# Patient Record
Sex: Female | Born: 1955
Health system: Southern US, Community
[De-identification: ages and names within clinical notes are randomized; demographics above are authoritative.]

## PROBLEM LIST (undated history)

## (undated) DIAGNOSIS — F419 Anxiety disorder, unspecified: Secondary | ICD-10-CM

## (undated) DIAGNOSIS — G44229 Chronic tension-type headache, not intractable: Secondary | ICD-10-CM

## (undated) DIAGNOSIS — Z789 Other specified health status: Secondary | ICD-10-CM

## (undated) DIAGNOSIS — M5412 Radiculopathy, cervical region: Secondary | ICD-10-CM

## (undated) DIAGNOSIS — F32A Depression, unspecified: Secondary | ICD-10-CM

## (undated) DIAGNOSIS — F1010 Alcohol abuse, uncomplicated: Secondary | ICD-10-CM

## (undated) DIAGNOSIS — M1120 Other chondrocalcinosis, unspecified site: Secondary | ICD-10-CM

## (undated) DIAGNOSIS — R5382 Chronic fatigue, unspecified: Secondary | ICD-10-CM

## (undated) DIAGNOSIS — F329 Major depressive disorder, single episode, unspecified: Secondary | ICD-10-CM

## (undated) DIAGNOSIS — R42 Dizziness and giddiness: Secondary | ICD-10-CM

## (undated) DIAGNOSIS — M1990 Unspecified osteoarthritis, unspecified site: Secondary | ICD-10-CM

## (undated) DIAGNOSIS — M2550 Pain in unspecified joint: Secondary | ICD-10-CM

## (undated) DIAGNOSIS — F10239 Alcohol dependence with withdrawal, unspecified: Secondary | ICD-10-CM

## (undated) DIAGNOSIS — F1014 Alcohol abuse with alcohol-induced mood disorder: Secondary | ICD-10-CM

## (undated) DIAGNOSIS — F3289 Other specified depressive episodes: Secondary | ICD-10-CM

## (undated) DIAGNOSIS — F1910 Other psychoactive substance abuse, uncomplicated: Secondary | ICD-10-CM

## (undated) DIAGNOSIS — F1020 Alcohol dependence, uncomplicated: Secondary | ICD-10-CM

## (undated) DIAGNOSIS — T753XXA Motion sickness, initial encounter: Secondary | ICD-10-CM

## (undated) DIAGNOSIS — F10129 Alcohol abuse with intoxication, unspecified: Secondary | ICD-10-CM

## (undated) DIAGNOSIS — G43E09 Chronic migraine with aura, not intractable, without status migrainosus: Secondary | ICD-10-CM

## (undated) HISTORY — DX: Alcohol abuse with intoxication, unspecified: F10.129

## (undated) HISTORY — DX: Other psychoactive substance abuse, uncomplicated: F19.10

## (undated) HISTORY — DX: Other specified depressive episodes: F32.89

## (undated) HISTORY — DX: Alcohol dependence, uncomplicated: F10.20

## (undated) HISTORY — PX: FOOT SURGERY: SHX648

## (undated) HISTORY — DX: Alcohol abuse with alcohol-induced mood disorder: F10.14

## (undated) HISTORY — DX: Alcohol dependence with withdrawal, unspecified: F10.239

## (undated) HISTORY — PX: NO PAST SURGERIES: SHX2092

## (undated) HISTORY — PX: CARPAL TUNNEL RELEASE: SHX101

## (undated) HISTORY — PX: TONSILLECTOMY: SUR1361

## (undated) HISTORY — PX: WRIST SURGERY: SHX841

---

## 1999-04-17 ENCOUNTER — Other Ambulatory Visit: Admission: RE | Admit: 1999-04-17 | Discharge: 1999-04-17 | Payer: Self-pay | Admitting: *Deleted

## 2000-10-09 ENCOUNTER — Other Ambulatory Visit: Admission: RE | Admit: 2000-10-09 | Discharge: 2000-10-09 | Payer: Self-pay | Admitting: *Deleted

## 2009-04-09 ENCOUNTER — Emergency Department: Payer: Self-pay | Admitting: Emergency Medicine

## 2009-04-11 ENCOUNTER — Inpatient Hospital Stay: Payer: Self-pay | Admitting: Psychiatry

## 2009-04-23 ENCOUNTER — Emergency Department: Payer: Self-pay | Admitting: Emergency Medicine

## 2009-06-19 ENCOUNTER — Emergency Department (HOSPITAL_COMMUNITY): Admission: EM | Admit: 2009-06-19 | Discharge: 2009-06-19 | Payer: Self-pay | Admitting: Emergency Medicine

## 2013-09-29 ENCOUNTER — Ambulatory Visit (INDEPENDENT_AMBULATORY_CARE_PROVIDER_SITE_OTHER): Payer: PRIVATE HEALTH INSURANCE

## 2013-09-29 ENCOUNTER — Encounter: Payer: Self-pay | Admitting: Podiatry

## 2013-09-29 ENCOUNTER — Ambulatory Visit (INDEPENDENT_AMBULATORY_CARE_PROVIDER_SITE_OTHER): Payer: PRIVATE HEALTH INSURANCE | Admitting: Podiatry

## 2013-09-29 VITALS — BP 124/80 | HR 83 | Resp 16 | Ht 65.0 in | Wt 112.0 lb

## 2013-09-29 DIAGNOSIS — M79609 Pain in unspecified limb: Secondary | ICD-10-CM

## 2013-09-29 DIAGNOSIS — M79671 Pain in right foot: Secondary | ICD-10-CM

## 2013-09-29 DIAGNOSIS — M2010 Hallux valgus (acquired), unspecified foot: Secondary | ICD-10-CM

## 2013-09-29 DIAGNOSIS — M2040 Other hammer toe(s) (acquired), unspecified foot: Secondary | ICD-10-CM

## 2013-09-29 NOTE — Progress Notes (Signed)
Patty Cruz presents today as a 57 year old white female with a chief complaint of painful bunions and hammertoes bilaterally. She states that the left foot is worse than the right. This is been like this for about 3 years what really bothers me as the crossing over the second toe and wearing closed in shoes tends to bother the toe. States the right foot is been bother me for about 15 years typically the fourth toe over here as it lays beneath the third toe and of course the bunions. She relates that she's tried wider shoe gear anti-inflammatories shoe gear modifications all to no avail.  Objective: Vital signs are stable she is alert and oriented x3. I have reviewed her past medical history medications and allergies. Her review of systems unremarkable. Lower extremity evaluation demonstrates pulses are palpable bilateral. Neurologic sensorium is intact per Semmes-Weinstein monofilament. Deep tendon reflexes are intact and brisk bilateral. Muscle strength was 5 over 5 dorsiflexors plantar flexors inverters and evertors. Orthopedic evaluation demonstrates moderate to severe HAV deformities left greater than right. These deformities are rigid in nature. They appear to be track bound very dorsiflexion is limited. She has a hammertoe deformity with overlapping of the second toe over the hallux left foot. Right foot demonstrates severe hallux valgus minimal hammertoe deformity second right. Fourth digit right foot does demonstrate adductovarus rotation is being irritated by the third toe walking on it. Radiographic evaluation demonstrates after mentioned HAV deformities and hammertoe deformities. It also demonstrates hallux valgus deformity with degenerative joint disease bilaterally.  Assessment: Hallux abductovalgus deformity bilateral. Also of her hammertoe deformity left. Hallux valgus right. Mild flexible hammertoe deformity second. Adductovarus rotated hammertoe deformity fourth right. And plantar flexed elongated  second metatarsals bilateral.  Plan: We discussed the etiology pathology conservative versus surgical therapies. We discussed Austin bunion repair second metatarsal osteotomy with screw fixation bilateral. Possibly hammertoe repair fourth digit right with screw. I will followup with her in the near future for surgical intervention and consult.

## 2013-09-29 NOTE — Progress Notes (Signed)
N SORE L LEFT BUNION, 2ND DIGIT D 45YRS O SLOWLY C WORSE A CLOSED IN SHOES T 0   N PAIN L RIGHT FOOT 4TH DIGIT, BUNION D 10-20YRS O SLOWLY C WORSE A RUBBING TOES TOGETHER T 0

## 2013-11-09 ENCOUNTER — Emergency Department: Payer: Self-pay | Admitting: Emergency Medicine

## 2013-11-09 LAB — BASIC METABOLIC PANEL
Anion Gap: 1 — ABNORMAL LOW (ref 7–16)
BUN: 3 mg/dL — ABNORMAL LOW (ref 7–18)
Calcium, Total: 8.9 mg/dL (ref 8.5–10.1)
Creatinine: 0.43 mg/dL — ABNORMAL LOW (ref 0.60–1.30)
EGFR (Non-African Amer.): >60
Osmolality: 270 (ref 275–301)
Potassium: 3.9 mmol/L (ref 3.5–5.1)
Sodium: 136 mmol/L (ref 136–145)

## 2013-11-09 LAB — CBC WITH DIFFERENTIAL/PLATELET
HCT: 42.3 % (ref 35.0–47.0)
HGB: 14.3 g/dL (ref 12.0–16.0)
Lymphocyte #: 1 10*3/uL (ref 1.0–3.6)
MCH: 35.3 pg — ABNORMAL HIGH (ref 26.0–34.0)
MCHC: 33.9 g/dL (ref 32.0–36.0)
Monocyte #: 0.3 x10 3/mm (ref 0.2–0.9)
Monocyte %: 9.7 %
Neutrophil #: 1.8 10*3/uL (ref 1.4–6.5)
Neutrophil %: 56.7 %
Platelet: 243 10*3/uL (ref 150–440)
RBC: 4.07 10*6/uL (ref 3.80–5.20)

## 2013-11-10 ENCOUNTER — Emergency Department: Payer: Self-pay | Admitting: Internal Medicine

## 2013-11-10 LAB — TROPONIN I: Troponin-I: <0.02 ng/mL

## 2013-11-10 LAB — DRUG SCREEN, URINE
Barbiturates, Ur Screen: NEGATIVE (ref ?–200)
MDMA (Ecstasy)Ur Screen: NEGATIVE (ref ?–500)
Opiate, Ur Screen: NEGATIVE (ref ?–300)

## 2013-11-10 LAB — URINALYSIS, COMPLETE
Bacteria: NONE SEEN
Bilirubin,UR: NEGATIVE
Glucose,UR: NEGATIVE mg/dL (ref 0–75)
Leukocyte Esterase: NEGATIVE
Protein: NEGATIVE
RBC,UR: 1 /HPF (ref 0–5)
Squamous Epithelial: NONE SEEN
WBC UR: <1 /HPF (ref 0–5)

## 2013-11-10 LAB — COMPREHENSIVE METABOLIC PANEL
Albumin: 4.2 g/dL (ref 3.4–5.0)
Alkaline Phosphatase: 99 U/L
Calcium, Total: 8.7 mg/dL (ref 8.5–10.1)
Chloride: 99 mmol/L (ref 98–107)
Co2: 30 mmol/L (ref 21–32)
Creatinine: 0.45 mg/dL — ABNORMAL LOW (ref 0.60–1.30)
Glucose: 106 mg/dL — ABNORMAL HIGH (ref 65–99)
SGPT (ALT): 76 U/L (ref 12–78)
Sodium: 134 mmol/L — ABNORMAL LOW (ref 136–145)

## 2013-11-10 LAB — CBC
HGB: 13.9 g/dL (ref 12.0–16.0)
MCH: 35 pg — ABNORMAL HIGH (ref 26.0–34.0)
Platelet: 225 10*3/uL (ref 150–440)
RDW: 12.4 % (ref 11.5–14.5)

## 2013-11-10 LAB — TSH: Thyroid Stimulating Horm: 3.06 u[IU]/mL

## 2013-11-10 LAB — CK TOTAL AND CKMB (NOT AT ARMC): CK, Total: 421 U/L — ABNORMAL HIGH (ref 21–215)

## 2013-11-10 LAB — ETHANOL
Ethanol %: 0.36 % (ref 0.000–0.080)
Ethanol: 360 mg/dL

## 2013-11-12 ENCOUNTER — Inpatient Hospital Stay: Payer: Self-pay | Admitting: Psychiatry

## 2013-11-12 LAB — URINALYSIS, COMPLETE
Bacteria: NONE SEEN
Bilirubin,UR: NEGATIVE
GLUCOSE, UR: NEGATIVE mg/dL (ref 0–75)
KETONE: NEGATIVE
Leukocyte Esterase: NEGATIVE
Nitrite: NEGATIVE
PH: 6 (ref 4.5–8.0)
Protein: NEGATIVE
RBC,UR: <1 /HPF (ref 0–5)
Specific Gravity: 1.003 (ref 1.003–1.030)

## 2013-11-12 LAB — COMPREHENSIVE METABOLIC PANEL
ALT: 66 U/L (ref 12–78)
Albumin: 4 g/dL (ref 3.4–5.0)
Alkaline Phosphatase: 91 U/L
Anion Gap: 7 (ref 7–16)
BILIRUBIN TOTAL: 0.3 mg/dL (ref 0.2–1.0)
BUN: 8 mg/dL (ref 7–18)
CHLORIDE: 102 mmol/L (ref 98–107)
Calcium, Total: 8.8 mg/dL (ref 8.5–10.1)
Co2: 25 mmol/L (ref 21–32)
Creatinine: 0.51 mg/dL — ABNORMAL LOW (ref 0.60–1.30)
GLUCOSE: 112 mg/dL — AB (ref 65–99)
OSMOLALITY: 267 (ref 275–301)
Potassium: 4.1 mmol/L (ref 3.5–5.1)
SGOT(AST): 93 U/L — ABNORMAL HIGH (ref 15–37)
Sodium: 134 mmol/L — ABNORMAL LOW (ref 136–145)
Total Protein: 7.9 g/dL (ref 6.4–8.2)

## 2013-11-12 LAB — CBC
HCT: 37.7 % (ref 35.0–47.0)
HGB: 12.9 g/dL (ref 12.0–16.0)
MCH: 35.6 pg — AB (ref 26.0–34.0)
MCHC: 34.3 g/dL (ref 32.0–36.0)
MCV: 104 fL — AB (ref 80–100)
PLATELETS: 173 10*3/uL (ref 150–440)
RBC: 3.63 10*6/uL — AB (ref 3.80–5.20)
RDW: 12.7 % (ref 11.5–14.5)
WBC: 5.8 10*3/uL (ref 3.6–11.0)

## 2013-11-12 LAB — DRUG SCREEN, URINE
AMPHETAMINES, UR SCREEN: NEGATIVE (ref ?–1000)
Barbiturates, Ur Screen: NEGATIVE (ref ?–200)
Benzodiazepine, Ur Scrn: NEGATIVE (ref ?–200)
COCAINE METABOLITE, UR ~~LOC~~: NEGATIVE (ref ?–300)
Cannabinoid 50 Ng, Ur ~~LOC~~: NEGATIVE (ref ?–50)
MDMA (ECSTASY) UR SCREEN: NEGATIVE (ref ?–500)
Methadone, Ur Screen: NEGATIVE (ref ?–300)
Opiate, Ur Screen: NEGATIVE (ref ?–300)
PHENCYCLIDINE (PCP) UR S: NEGATIVE (ref ?–25)
Tricyclic, Ur Screen: NEGATIVE (ref ?–1000)

## 2013-11-12 LAB — SALICYLATE LEVEL

## 2013-11-12 LAB — ETHANOL
Ethanol %: 0.203 % — ABNORMAL HIGH (ref 0.000–0.080)
Ethanol %: 0.056 % (ref 0.000–0.080)
Ethanol: 203 mg/dL
Ethanol: 56 mg/dL

## 2013-11-12 LAB — ACETAMINOPHEN LEVEL: Acetaminophen: <2 ug/mL

## 2014-06-05 ENCOUNTER — Emergency Department: Payer: Self-pay | Admitting: Emergency Medicine

## 2014-06-05 LAB — OSMOLALITY: Osmolality: 385 mOsm/kg (ref 275–295)

## 2014-06-05 LAB — COMPREHENSIVE METABOLIC PANEL
ALBUMIN: 4.2 g/dL (ref 3.4–5.0)
AST: 45 U/L — AB (ref 15–37)
Albumin: 4.3 g/dL (ref 3.4–5.0)
Alkaline Phosphatase: 78 U/L
Alkaline Phosphatase: 71 U/L
Anion Gap: 12 (ref 7–16)
Anion Gap: 11 (ref 7–16)
BILIRUBIN TOTAL: 0.5 mg/dL (ref 0.2–1.0)
BUN: 7 mg/dL (ref 7–18)
BUN: 10 mg/dL (ref 7–18)
Bilirubin,Total: 0.3 mg/dL (ref 0.2–1.0)
CALCIUM: 8.3 mg/dL — AB (ref 8.5–10.1)
CHLORIDE: 94 mmol/L — AB (ref 98–107)
CO2: 26 mmol/L (ref 21–32)
CREATININE: 0.57 mg/dL — AB (ref 0.60–1.30)
Calcium, Total: 9.1 mg/dL (ref 8.5–10.1)
Chloride: 102 mmol/L (ref 98–107)
Co2: 26 mmol/L (ref 21–32)
Creatinine: 0.59 mg/dL — ABNORMAL LOW (ref 0.60–1.30)
EGFR (African American): >60
EGFR (African American): >60
EGFR (Non-African Amer.): >60
GLUCOSE: 101 mg/dL — AB (ref 65–99)
Glucose: 142 mg/dL — ABNORMAL HIGH (ref 65–99)
Osmolality: 278 (ref 275–301)
Osmolality: 264 (ref 275–301)
Potassium: 3.7 mmol/L (ref 3.5–5.1)
Potassium: 3.8 mmol/L (ref 3.5–5.1)
SGOT(AST): 43 U/L — ABNORMAL HIGH (ref 15–37)
SGPT (ALT): 39 U/L
SGPT (ALT): 36 U/L
Sodium: 140 mmol/L (ref 136–145)
Sodium: 131 mmol/L — ABNORMAL LOW (ref 136–145)
TOTAL PROTEIN: 8.8 g/dL — AB (ref 6.4–8.2)
Total Protein: 8 g/dL (ref 6.4–8.2)

## 2014-06-05 LAB — URINALYSIS, COMPLETE
Bilirubin,UR: NEGATIVE
Blood: NEGATIVE
GLUCOSE, UR: NEGATIVE mg/dL (ref 0–75)
Ketone: NEGATIVE
LEUKOCYTE ESTERASE: NEGATIVE
Nitrite: NEGATIVE
Ph: 5 (ref 4.5–8.0)
Protein: 100
Specific Gravity: 1.023 (ref 1.003–1.030)
Squamous Epithelial: 2
WBC UR: 7 /HPF (ref 0–5)

## 2014-06-05 LAB — CBC
HCT: 48.9 % — ABNORMAL HIGH (ref 35.0–47.0)
HCT: 41.3 % (ref 35.0–47.0)
HGB: 16 g/dL (ref 12.0–16.0)
HGB: 14 g/dL (ref 12.0–16.0)
MCH: 34.1 pg — AB (ref 26.0–34.0)
MCH: 34.8 pg — AB (ref 26.0–34.0)
MCHC: 32.8 g/dL (ref 32.0–36.0)
MCHC: 33.9 g/dL (ref 32.0–36.0)
MCV: 104 fL — ABNORMAL HIGH (ref 80–100)
MCV: 103 fL — ABNORMAL HIGH (ref 80–100)
PLATELETS: 273 10*3/uL (ref 150–440)
Platelet: 219 10*3/uL (ref 150–440)
RBC: 4.71 10*6/uL (ref 3.80–5.20)
RBC: 4.02 10*6/uL (ref 3.80–5.20)
RDW: 13 % (ref 11.5–14.5)
RDW: 12.6 % (ref 11.5–14.5)
WBC: 5 10*3/uL (ref 3.6–11.0)
WBC: 6.7 10*3/uL (ref 3.6–11.0)

## 2014-06-05 LAB — SALICYLATE LEVEL

## 2014-06-05 LAB — ETHANOL
ETHANOL %: 0.008 % (ref 0.000–0.080)
Ethanol %: 0.362 % (ref 0.000–0.080)
Ethanol: 362 mg/dL
Ethanol: 8 mg/dL

## 2014-06-05 LAB — DRUG SCREEN, URINE

## 2014-06-05 LAB — ACETAMINOPHEN LEVEL: Acetaminophen: <2 ug/mL

## 2014-06-05 LAB — TSH: Thyroid Stimulating Horm: 1.91 u[IU]/mL

## 2014-06-05 LAB — MAGNESIUM: Magnesium: 1.9 mg/dL

## 2014-06-06 LAB — DRUG SCREEN, URINE

## 2014-06-26 LAB — COMPREHENSIVE METABOLIC PANEL
ALT: 22 U/L
AST: 31 U/L (ref 15–37)
Albumin: 4.1 g/dL (ref 3.4–5.0)
Alkaline Phosphatase: 78 U/L
Anion Gap: 12 (ref 7–16)
BUN: 6 mg/dL — ABNORMAL LOW (ref 7–18)
Bilirubin,Total: 0.2 mg/dL (ref 0.2–1.0)
CHLORIDE: 102 mmol/L (ref 98–107)
Calcium, Total: 8.3 mg/dL — ABNORMAL LOW (ref 8.5–10.1)
Co2: 26 mmol/L (ref 21–32)
Creatinine: 0.65 mg/dL (ref 0.60–1.30)
EGFR (African American): >60
Glucose: 107 mg/dL — ABNORMAL HIGH (ref 65–99)
Osmolality: 277 (ref 275–301)
Potassium: 3.9 mmol/L (ref 3.5–5.1)
Sodium: 140 mmol/L (ref 136–145)
Total Protein: 8.6 g/dL — ABNORMAL HIGH (ref 6.4–8.2)

## 2014-06-26 LAB — URINALYSIS, COMPLETE
Bacteria: NONE SEEN
Bilirubin,UR: NEGATIVE
Blood: NEGATIVE
GLUCOSE, UR: NEGATIVE mg/dL (ref 0–75)
Ketone: NEGATIVE
Leukocyte Esterase: NEGATIVE
Nitrite: NEGATIVE
PH: 5 (ref 4.5–8.0)
Protein: NEGATIVE
RBC,UR: 1 /HPF (ref 0–5)
SPECIFIC GRAVITY: 1.016 (ref 1.003–1.030)
Squamous Epithelial: 1
WBC UR: <1 /HPF (ref 0–5)

## 2014-06-26 LAB — CBC
HCT: 45.7 % (ref 35.0–47.0)
HGB: 15.5 g/dL (ref 12.0–16.0)
MCH: 35.2 pg — ABNORMAL HIGH (ref 26.0–34.0)
MCHC: 34 g/dL (ref 32.0–36.0)
MCV: 104 fL — ABNORMAL HIGH (ref 80–100)
Platelet: 344 10*3/uL (ref 150–440)
RBC: 4.41 10*6/uL (ref 3.80–5.20)
RDW: 12.8 % (ref 11.5–14.5)
WBC: 5.8 10*3/uL (ref 3.6–11.0)

## 2014-06-26 LAB — DRUG SCREEN, URINE

## 2014-06-26 LAB — SALICYLATE LEVEL: SALICYLATES, SERUM: 2.6 mg/dL

## 2014-06-26 LAB — ACETAMINOPHEN LEVEL

## 2014-06-26 LAB — ETHANOL
Ethanol %: 0.501 % (ref 0.000–0.080)
Ethanol: 501 mg/dL

## 2014-06-26 LAB — TSH: THYROID STIMULATING HORM: 1.59 u[IU]/mL

## 2014-06-27 ENCOUNTER — Inpatient Hospital Stay: Payer: Self-pay | Admitting: Psychiatry

## 2014-06-27 LAB — ETHANOL
Ethanol %: 0.084 % — ABNORMAL HIGH (ref 0.000–0.080)
Ethanol: 84 mg/dL

## 2014-06-27 LAB — MAGNESIUM: Magnesium: 2.3 mg/dL

## 2014-09-27 ENCOUNTER — Emergency Department: Payer: Self-pay | Admitting: Emergency Medicine

## 2014-09-27 LAB — DRUG SCREEN, URINE
Amphetamines, Ur Screen: NEGATIVE (ref ?–1000)
BENZODIAZEPINE, UR SCRN: NEGATIVE (ref ?–200)
Barbiturates, Ur Screen: NEGATIVE (ref ?–200)
Cannabinoid 50 Ng, Ur ~~LOC~~: NEGATIVE (ref ?–50)
Cocaine Metabolite,Ur ~~LOC~~: NEGATIVE (ref ?–300)
MDMA (ECSTASY) UR SCREEN: NEGATIVE (ref ?–500)
METHADONE, UR SCREEN: NEGATIVE (ref ?–300)
Opiate, Ur Screen: NEGATIVE (ref ?–300)
Phencyclidine (PCP) Ur S: NEGATIVE (ref ?–25)
Tricyclic, Ur Screen: NEGATIVE (ref ?–1000)

## 2014-09-27 LAB — ACETAMINOPHEN LEVEL: Acetaminophen: <2 ug/mL

## 2014-09-27 LAB — URINALYSIS, COMPLETE
BLOOD: NEGATIVE
Bacteria: NONE SEEN
Bilirubin,UR: NEGATIVE
Glucose,UR: NEGATIVE mg/dL (ref 0–75)
Ketone: NEGATIVE
LEUKOCYTE ESTERASE: NEGATIVE
Nitrite: NEGATIVE
PH: 5 (ref 4.5–8.0)
Protein: NEGATIVE
SQUAMOUS EPITHELIAL: NONE SEEN
Specific Gravity: 1.011 (ref 1.003–1.030)

## 2014-09-27 LAB — CBC
HCT: 46 % (ref 35.0–47.0)
HGB: 15.1 g/dL (ref 12.0–16.0)
MCH: 33.5 pg (ref 26.0–34.0)
MCHC: 32.8 g/dL (ref 32.0–36.0)
MCV: 102 fL — ABNORMAL HIGH (ref 80–100)
PLATELETS: 374 10*3/uL (ref 150–440)
RBC: 4.51 10*6/uL (ref 3.80–5.20)
RDW: 12.7 % (ref 11.5–14.5)
WBC: 6.3 10*3/uL (ref 3.6–11.0)

## 2014-09-27 LAB — COMPREHENSIVE METABOLIC PANEL
ALK PHOS: 93 U/L
AST: 32 U/L (ref 15–37)
Albumin: 4 g/dL (ref 3.4–5.0)
Anion Gap: 12 (ref 7–16)
BILIRUBIN TOTAL: 0.4 mg/dL (ref 0.2–1.0)
BUN: 8 mg/dL (ref 7–18)
CO2: 25 mmol/L (ref 21–32)
Calcium, Total: 8.5 mg/dL (ref 8.5–10.1)
Chloride: 101 mmol/L (ref 98–107)
Creatinine: 0.6 mg/dL (ref 0.60–1.30)
EGFR (African American): >60
EGFR (Non-African Amer.): >60
Glucose: 109 mg/dL — ABNORMAL HIGH (ref 65–99)
OSMOLALITY: 275 (ref 275–301)
POTASSIUM: 4.1 mmol/L (ref 3.5–5.1)
SGPT (ALT): 27 U/L
Sodium: 138 mmol/L (ref 136–145)
TOTAL PROTEIN: 8.1 g/dL (ref 6.4–8.2)

## 2014-09-27 LAB — ETHANOL: Ethanol: 369 mg/dL

## 2014-09-27 LAB — SALICYLATE LEVEL: SALICYLATES, SERUM: 3.3 mg/dL — AB

## 2014-11-22 ENCOUNTER — Emergency Department: Payer: Self-pay | Admitting: Emergency Medicine

## 2014-11-22 LAB — URINALYSIS, COMPLETE
BACTERIA: NONE SEEN
Bilirubin,UR: NEGATIVE
Blood: NEGATIVE
Glucose,UR: NEGATIVE mg/dL (ref 0–75)
KETONE: NEGATIVE
Leukocyte Esterase: NEGATIVE
Nitrite: NEGATIVE
PROTEIN: NEGATIVE
Ph: 5 (ref 4.5–8.0)
Specific Gravity: 1.016 (ref 1.003–1.030)
Squamous Epithelial: 1

## 2014-11-22 LAB — COMPREHENSIVE METABOLIC PANEL
ANION GAP: 8 (ref 7–16)
Albumin: 3.9 g/dL (ref 3.4–5.0)
Alkaline Phosphatase: 83 U/L
BUN: 6 mg/dL — AB (ref 7–18)
Bilirubin,Total: 0.3 mg/dL (ref 0.2–1.0)
Calcium, Total: 8.1 mg/dL — ABNORMAL LOW (ref 8.5–10.1)
Chloride: 97 mmol/L — ABNORMAL LOW (ref 98–107)
Co2: 29 mmol/L (ref 21–32)
Creatinine: 0.54 mg/dL — ABNORMAL LOW (ref 0.60–1.30)
EGFR (Non-African Amer.): >60
Glucose: 92 mg/dL (ref 65–99)
OSMOLALITY: 265 (ref 275–301)
POTASSIUM: 3.9 mmol/L (ref 3.5–5.1)
SGOT(AST): 34 U/L (ref 15–37)
SGPT (ALT): 30 U/L
SODIUM: 134 mmol/L — AB (ref 136–145)
TOTAL PROTEIN: 7.9 g/dL (ref 6.4–8.2)

## 2014-11-22 LAB — CBC
HCT: 42.9 % (ref 35.0–47.0)
HGB: 14.2 g/dL (ref 12.0–16.0)
MCH: 33.2 pg (ref 26.0–34.0)
MCHC: 33.1 g/dL (ref 32.0–36.0)
MCV: 100 fL (ref 80–100)
Platelet: 267 10*3/uL (ref 150–440)
RBC: 4.28 10*6/uL (ref 3.80–5.20)
RDW: 13.3 % (ref 11.5–14.5)
WBC: 5.2 10*3/uL (ref 3.6–11.0)

## 2014-11-22 LAB — DRUG SCREEN, URINE
AMPHETAMINES, UR SCREEN: NEGATIVE (ref ?–1000)
BENZODIAZEPINE, UR SCRN: NEGATIVE (ref ?–200)
Barbiturates, Ur Screen: NEGATIVE (ref ?–200)
Cannabinoid 50 Ng, Ur ~~LOC~~: NEGATIVE (ref ?–50)
Cocaine Metabolite,Ur ~~LOC~~: NEGATIVE (ref ?–300)
MDMA (ECSTASY) UR SCREEN: NEGATIVE (ref ?–500)
Methadone, Ur Screen: NEGATIVE (ref ?–300)
Opiate, Ur Screen: NEGATIVE (ref ?–300)
Phencyclidine (PCP) Ur S: NEGATIVE (ref ?–25)
TRICYCLIC, UR SCREEN: NEGATIVE (ref ?–1000)

## 2014-11-22 LAB — TSH: THYROID STIMULATING HORM: 2.5 u[IU]/mL

## 2014-11-22 LAB — ACETAMINOPHEN LEVEL

## 2014-11-22 LAB — ETHANOL: ETHANOL LVL: 399 mg/dL — AB

## 2014-11-22 LAB — SALICYLATE LEVEL: Salicylates, Serum: 2.7 mg/dL

## 2014-12-27 ENCOUNTER — Inpatient Hospital Stay (HOSPITAL_COMMUNITY)
Admission: EM | Admit: 2014-12-27 | Discharge: 2014-12-30 | DRG: 897 | Disposition: A | Discharge status: Home / Self Care | Payer: No Typology Code available for payment source | Source: Intra-hospital | Attending: Psychiatry | Admitting: Psychiatry

## 2014-12-27 ENCOUNTER — Emergency Department (HOSPITAL_COMMUNITY)
Admission: EM | Admit: 2014-12-27 | Discharge: 2014-12-27 | Disposition: A | Discharge status: Other Acute Inpatient Hospital | Payer: No Typology Code available for payment source | Attending: Emergency Medicine | Admitting: Emergency Medicine

## 2014-12-27 ENCOUNTER — Encounter (HOSPITAL_COMMUNITY): Payer: Self-pay

## 2014-12-27 DIAGNOSIS — F1010 Alcohol abuse, uncomplicated: Secondary | ICD-10-CM | POA: Diagnosis present

## 2014-12-27 DIAGNOSIS — F332 Major depressive disorder, recurrent severe without psychotic features: Secondary | ICD-10-CM | POA: Insufficient documentation

## 2014-12-27 DIAGNOSIS — F10230 Alcohol dependence with withdrawal, uncomplicated: Secondary | ICD-10-CM | POA: Insufficient documentation

## 2014-12-27 DIAGNOSIS — F1024 Alcohol dependence with alcohol-induced mood disorder: Secondary | ICD-10-CM | POA: Diagnosis present

## 2014-12-27 DIAGNOSIS — F1020 Alcohol dependence, uncomplicated: Secondary | ICD-10-CM | POA: Diagnosis present

## 2014-12-27 LAB — CBC
HEMATOCRIT: 44.9 % (ref 36.0–46.0)
HEMOGLOBIN: 15.7 g/dL — AB (ref 12.0–15.0)
MCH: 33.8 pg (ref 26.0–34.0)
MCHC: 35 g/dL (ref 30.0–36.0)
MCV: 96.6 fL (ref 78.0–100.0)
PLATELETS: 284 10*3/uL (ref 150–400)
RBC: 4.65 MIL/uL (ref 3.87–5.11)
RDW: 12.5 % (ref 11.5–15.5)
WBC: 6.6 10*3/uL (ref 4.0–10.5)

## 2014-12-27 LAB — COMPREHENSIVE METABOLIC PANEL
ALBUMIN: 4.9 g/dL (ref 3.5–5.2)
ALK PHOS: 95 U/L (ref 39–117)
ALT: 59 U/L — AB (ref 0–35)
AST: 84 U/L — ABNORMAL HIGH (ref 0–37)
Anion gap: 13 (ref 5–15)
BUN: 7 mg/dL (ref 6–23)
CALCIUM: 9.3 mg/dL (ref 8.4–10.5)
CO2: 30 mmol/L (ref 19–32)
Chloride: 94 mmol/L — ABNORMAL LOW (ref 96–112)
Creatinine, Ser: 0.66 mg/dL (ref 0.50–1.10)
GFR calc non Af Amer: >90 mL/min (ref >90)
GLUCOSE: 97 mg/dL (ref 70–99)
POTASSIUM: 4.6 mmol/L (ref 3.5–5.1)
SODIUM: 137 mmol/L (ref 135–145)
TOTAL PROTEIN: 8.5 g/dL — AB (ref 6.0–8.3)
Total Bilirubin: 0.7 mg/dL (ref 0.3–1.2)

## 2014-12-27 LAB — SALICYLATE LEVEL: Salicylate Lvl: <4 mg/dL (ref 2.8–20.0)

## 2014-12-27 LAB — RAPID URINE DRUG SCREEN, HOSP PERFORMED
AMPHETAMINES: NOT DETECTED
BARBITURATES: NOT DETECTED
Benzodiazepines: NOT DETECTED
Cocaine: NOT DETECTED
Opiates: NOT DETECTED
TETRAHYDROCANNABINOL: NOT DETECTED

## 2014-12-27 LAB — ACETAMINOPHEN LEVEL: Acetaminophen (Tylenol), Serum: <10 ug/mL — ABNORMAL LOW (ref 10–30)

## 2014-12-27 LAB — ETHANOL: Alcohol, Ethyl (B): 183 mg/dL — ABNORMAL HIGH (ref 0–9)

## 2014-12-27 MED ORDER — LORAZEPAM 1 MG PO TABS
0.0000 mg | ORAL_TABLET | Freq: Four times a day (QID) | ORAL | Status: DC
  Administered 2014-12-27: 2 mg via ORAL
  Filled 2014-12-27: qty 2

## 2014-12-27 MED ORDER — THIAMINE HCL 100 MG/ML IJ SOLN: 100.0000 mg | Freq: Every day | INTRAMUSCULAR | Status: DC

## 2014-12-27 MED ORDER — MAGNESIUM HYDROXIDE 400 MG/5ML PO SUSP: 30.0000 mL | Freq: Every day | ORAL | Status: DC | PRN

## 2014-12-27 MED ORDER — LORAZEPAM 1 MG PO TABS
1.0000 mg | ORAL_TABLET | Freq: Four times a day (QID) | ORAL | Status: DC | PRN
Start: 2014-12-27 — End: 2014-12-27
  Administered 2014-12-27: 1 mg via ORAL
  Filled 2014-12-27: qty 1

## 2014-12-27 MED ORDER — VENLAFAXINE HCL ER 75 MG PO CP24
75.0000 mg | ORAL_CAPSULE | Freq: Every day | ORAL | Status: DC
  Administered 2014-12-27: 75 mg via ORAL
  Filled 2014-12-27 (×2): qty 1

## 2014-12-27 MED ORDER — VITAMIN B-1 100 MG PO TABS
100.0000 mg | ORAL_TABLET | Freq: Every day | ORAL | Status: DC
  Administered 2014-12-27: 100 mg via ORAL
  Filled 2014-12-27: qty 1

## 2014-12-27 MED ORDER — ONDANSETRON 4 MG PO TBDP
4.0000 mg | ORAL_TABLET | Freq: Three times a day (TID) | ORAL | Status: DC | PRN
  Administered 2014-12-27: 4 mg via ORAL
  Filled 2014-12-27: qty 1

## 2014-12-27 MED ORDER — VENLAFAXINE HCL ER 75 MG PO CP24
75.0000 mg | ORAL_CAPSULE | Freq: Every day | ORAL | Status: DC
  Administered 2014-12-28 – 2014-12-29 (×2): 75 mg via ORAL
  Filled 2014-12-27 (×5): qty 1

## 2014-12-27 MED ORDER — WHITE PETROLATUM GEL: Status: DC | PRN

## 2014-12-27 MED ORDER — ONDANSETRON 4 MG PO TBDP: 4.0000 mg | ORAL_TABLET | Freq: Three times a day (TID) | ORAL | Status: DC | PRN

## 2014-12-27 MED ORDER — LORAZEPAM 1 MG PO TABS: 0.0000 mg | ORAL_TABLET | Freq: Two times a day (BID) | ORAL | Status: DC

## 2014-12-27 MED ORDER — LORAZEPAM 2 MG/ML IJ SOLN: 0.0000 mg | Freq: Two times a day (BID) | INTRAMUSCULAR | Status: DC

## 2014-12-27 MED ORDER — LORAZEPAM 1 MG PO TABS
0.0000 mg | ORAL_TABLET | Freq: Four times a day (QID) | ORAL | Status: DC
  Administered 2014-12-27 – 2014-12-28 (×2): 1 mg via ORAL
  Filled 2014-12-27 (×2): qty 1

## 2014-12-27 MED ORDER — ACETAMINOPHEN 325 MG PO TABS: 650.0000 mg | ORAL_TABLET | Freq: Four times a day (QID) | ORAL | Status: DC | PRN

## 2014-12-27 MED ORDER — ALUM & MAG HYDROXIDE-SIMETH 200-200-20 MG/5ML PO SUSP: 30.0000 mL | ORAL | Status: DC | PRN

## 2014-12-27 MED ORDER — LORAZEPAM 2 MG/ML IJ SOLN: 0.0000 mg | Freq: Four times a day (QID) | INTRAMUSCULAR | Status: DC

## 2014-12-27 MED ORDER — LORAZEPAM 1 MG PO TABS: 1.0000 mg | ORAL_TABLET | Freq: Four times a day (QID) | ORAL | Status: DC | PRN

## 2014-12-27 MED ORDER — VITAMIN B-1 100 MG PO TABS
100.0000 mg | ORAL_TABLET | Freq: Every day | ORAL | Status: DC
  Administered 2014-12-28: 100 mg via ORAL
  Filled 2014-12-27 (×3): qty 1

## 2014-12-27 MED ORDER — WHITE PETROLATUM GEL
Status: DC | PRN
  Filled 2014-12-27: qty 28.35

## 2014-12-27 NOTE — ED Notes (Signed)
Brother : Esmeralda LinksRichards Rhodes [phone]

## 2014-12-27 NOTE — ED Notes (Signed)
Family at bedside. 

## 2014-12-27 NOTE — BH Assessment (Addendum)
Tele Assessment Note   Patty Cruz is an 59 y.o. female who came to the Emergency Department after a relapse from alcohol abuse. She stated that she was in treatment for a month at [facility] and got back on Wednesday of last week when she began drinking again. She presents as very depressed with slow speech and thoughts of hopelessness and worthlessness. She states that she denies SI thoughts but provider is concerned that if alcohol use continues she may develop them. Her brother just passed from a alcohol induced accident in the shower in November and she is afraid that she is going to die from her addiction. She states that she was on Effexor for her depression but stopped taking it last Wednesday when she started drinking again. She states that she uses drinking to get "a high" to help her cope with her depression. She says that she recently lost her job due to her alcohol abuse which is contributing to her thoughts of hopelessness and worthlessness. Pt denies HI, and A/V hallucinations to prior history of violence or aggression. Pt was in rehab in 2010 where she was able to stay sober for 2 years. Pt states it is hard for her to take care of herself right now and has decreased grooming and eating. She has not eaten in 3 days and states she has lost 5 pounds. She currently feels lethargic and nauseas.   Disposition- Per Nanine Means NP recommended inpatient treatment.   Axis I: 296.33 Major Depressive Disorder, Recurrent Episode, Severe, 303.90 Alcohol use disorder, severe   Axis II: Deferred Axis III: History reviewed. No pertinent past medical history. Axis IV: occupational problems, other psychosocial or environmental problems and problems related to social environment Axis V: 21-30 behavior considerably influenced by delusions or hallucinations OR serious impairment in judgment, communication OR inability to function in almost all areas  Past Medical History: History reviewed. No  pertinent past medical history.  Past Surgical History  Procedure Laterality Date  . Cesarean section  1991  . Foot surgery Right 1983  . Wrist surgery Right     Family History: History reviewed. No pertinent family history.  Social History:  reports that she has never smoked. She has never used smokeless tobacco. She reports that she does not drink alcohol or use illicit drugs.  Additional Social History:  Alcohol / Drug Use History of alcohol / drug use?: Yes Longest period of sobriety (when/how long): 2 years with support of AA  Negative Consequences of Use: Financial, Personal relationships Withdrawal Symptoms: Weakness, Nausea / Vomiting Substance #1 Name of Substance 1: Alcohol  1 - Age of First Use: 29 1 - Amount (size/oz): 2 bottles of wine  1 - Frequency: Daily  1 - Duration: All day  1 - Last Use / Amount: 2 bottles of wine yesterday   CIWA: CIWA-Ar BP: 126/71 mmHg Pulse Rate: 99 COWS:    PATIENT STRENGTHS: (choose at least two) Average or above average intelligence General fund of knowledge Motivation for treatment/growth  Allergies:  Allergies  Allergen Reactions  . Codeine Nausea And Vomiting  . Tramadol Nausea Only    Home Medications:  (Not in a hospital admission)  OB/GYN Status:  No LMP recorded. Patient is postmenopausal.  General Assessment Data Location of Assessment: [facility] Is this a Tele or Face-to-Face Assessment?: Face-to-Face Is this an Initial Assessment or a Re-assessment for this encounter?: Initial Assessment Living Arrangements: Alone Can pt return to current living arrangement?: Yes Admission Status:  Voluntary Is patient capable of signing voluntary admission?: Yes Transfer from: Home Referral Source: Self/Family/Friend     [facility] Crisis Care Plan Living Arrangements: Alone Name of Psychiatrist:  (Dr. Lysbeth Penner ([facility])) Name of Therapist:  (No)  Education Status Is patient currently in school?: No  Risk to self with  the past 6 months Suicidal Ideation: No Suicidal Intent: No Is patient at risk for suicide?: No Suicidal Plan?: No Access to Means: No What has been your use of drugs/alcohol within the last 12 months?:  (Alcohol abuse) Previous Attempts/Gestures: No How many times?: 0 Other Self Harm Risks: Drinking in excess Triggers for Past Attempts: None known Intentional Self Injurious Behavior:  (drinking in excess) Family Suicide History: No (brother died from an alcohol related accident in november) Recent stressful life event(s): Loss (Comment) (Job loss and loss of brother in november) Persecutory voices/beliefs?: No Depression: Yes Depression Symptoms: Despondent, Tearfulness, Isolating, Fatigue, Loss of interest in usual pleasures, Feeling worthless/self pity Substance abuse history and/or treatment for substance abuse?: Yes Suicide prevention information given to non-admitted patients: Not applicable  Risk to Others within the past 6 months Homicidal Ideation: No Thoughts of Harm to Others: No Current Homicidal Intent: No Current Homicidal Plan: No Access to Homicidal Means: No Identified Victim:  (No) History of harm to others?: No Assessment of Violence: None Noted Violent Behavior Description: none Does patient have access to weapons?: No Criminal Charges Pending?: No Does patient have a court date: No  Psychosis Hallucinations: None noted Delusions: None noted  Mental Status Report Appear/Hygiene: In scrubs Eye Contact: Good Motor Activity: Rigidity Speech: Soft, Slow Level of Consciousness: Alert Mood: Depressed Affect: Depressed Anxiety Level: Moderate Thought Processes: Coherent Judgement: Impaired Orientation: Person, Place, Time, Situation Obsessive Compulsive Thoughts/Behaviors: None  Cognitive Functioning Concentration: Normal Memory: Recent Intact, Remote Intact IQ: Average Insight: Good Impulse Control: Poor Appetite: Poor (only drinking) Weight  Loss: 5 Weight Gain: 0 Sleep: No Change Total Hours of Sleep:  (6) Vegetative Symptoms: Staying in bed  ADLScreening [facility]) Patient's cognitive ability adequate to safely complete daily activities?: Yes Patient able to express need for assistance with ADLs?: Yes Independently performs ADLs?: Yes (appropriate for developmental age)  Prior Inpatient Therapy Prior Inpatient Therapy: Yes Prior Therapy Dates:  (1 month- got out on wednesday) Prior Therapy Facilty/Provider(s): [facility] Reason for Treatment: substance abuse  Prior Outpatient Therapy Prior Outpatient Therapy: No  ADL Screening (condition at time of admission) Patient's cognitive ability adequate to safely complete daily activities?: Yes Is the patient deaf or have difficulty hearing?: No Does the patient have difficulty seeing, even when wearing glasses/contacts?: No Does the patient have difficulty concentrating, remembering, or making decisions?: No Patient able to express need for assistance with ADLs?: Yes Does the patient have difficulty dressing or bathing?: No Independently performs ADLs?: Yes (appropriate for developmental age) Does the patient have difficulty walking or climbing stairs?: Yes Weakness of Legs: Both (weak from alcohol use and not eating) Weakness of Arms/Hands: None  Home Assistive Devices/Equipment Home Assistive Devices/Equipment: None    Abuse/Neglect Assessment (Assessment to be complete while patient is alone) Physical Abuse: Denies Verbal Abuse: Denies Sexual Abuse: Denies Exploitation of patient/patient's resources: Denies Self-Neglect: Yes, present (Comment) (not eating only drinking)     Advance Directives (For Healthcare) Does patient have an advance directive?: Yes Would patient like information on creating an advanced directive?: No - patient declined information Type of Advance Directive: Living will Does patient want to make changes to  advanced  directive?: No - Patient declined    Additional Information 1:1 In Past 12 Months?: No CIRT Risk: No Elopement Risk: No Does patient have medical clearance?: No     Disposition:  Disposition Initial Assessment Completed for this Encounter: Yes Disposition of Patient: Inpatient treatment program Type of inpatient treatment program: Adult  Berlie Hatchel 12/27/2014 11:51 AM

## 2014-12-27 NOTE — ED Notes (Signed)
Pt states she wants to detox from alcohol.  Pt states she drank 2 bottles of wine last night and started vomiting.  Has been heavy drinker for "a lot".  No specific time frame.

## 2014-12-27 NOTE — [facility] Counselor (Signed)
[facility] per Thurman CoyerEric Kaplan, support paperwork given.  Kateri PlummerKristin Tylar Amborn, M.S., LPCA, [facility] Licensed Professional Counselor Associate  Triage Specialist  [facility]  Therapeutic Triage Services Phone: [phone] Fax: [phone]

## 2014-12-27 NOTE — ED Notes (Signed)
Pt transported to [facility] by [facility] transportation service for continuation of specialized care. Belongings given to driver after patient signed for them. She left in no acute distress. 

## 2014-12-27 NOTE — ED Provider Notes (Signed)
CSN: [id number]     Arrival date & time 12/27/14  0944 History   First MD Initiated Contact with Patient 12/27/14 1014     Chief Complaint  Patient presents with  . Alcohol Problem     (Consider location/radiation/quality/duration/timing/severity/associated sxs/prior Treatment) HPI Comments: 59 year old female just got [facility] wanting help with alcohol. She has had multiple detox stints in the past. She has been drinking for the past 4 days since she got out. No suicidal ideation. She is stressed because she lost a brother to alcohol recently.  Patient is a 59 y.o. female presenting with alcohol problem. The history is provided by the patient.  Alcohol Problem This is a chronic problem. The current episode started more than 1 week ago. The problem occurs constantly. The problem has been gradually worsening. Pertinent negatives include no chest pain, no abdominal pain, no headaches and no shortness of breath. Nothing aggravates the symptoms. Nothing relieves the symptoms.    History reviewed. No pertinent past medical history. Past Surgical History  Procedure Laterality Date  . Cesarean section  1991  . Foot surgery Right 1983  . Wrist surgery Right    History reviewed. No pertinent family history. History  Substance Use Topics  . Smoking status: Never Smoker   . Smokeless tobacco: Never Used  . Alcohol Use: No   OB History    No data available     Review of Systems  Constitutional: Negative for fever and chills.  Respiratory: Negative for shortness of breath.   Cardiovascular: Negative for chest pain.  Gastrointestinal: Negative for abdominal pain.  Neurological: Negative for headaches.  All other systems reviewed and are negative.     Allergies  Codeine and Tramadol  Home Medications   Prior to Admission medications   Not on File   BP 126/71 mmHg  Pulse 99  Temp(Src) 98.7 F (37.1 C) (Oral)  Resp 18  SpO2  96% Physical Exam  Constitutional: She is oriented to person, place, and time. She appears well-developed and well-nourished. No distress.  HENT:  Head: Normocephalic and atraumatic.  Mouth/Throat: Oropharynx is clear and moist.  Eyes: EOM are normal. Pupils are equal, round, and reactive to light.  Neck: Normal range of motion. Neck supple.  Cardiovascular: Normal rate and regular rhythm.  Exam reveals no friction rub.   No murmur heard. Pulmonary/Chest: Effort normal and breath sounds normal. No respiratory distress. She has no wheezes. She has no rales.  Abdominal: Soft. She exhibits no distension. There is no tenderness. There is no rebound.  Musculoskeletal: Normal range of motion. She exhibits no edema.  Neurological: She is alert and oriented to person, place, and time.  Skin: She is not diaphoretic.  Nursing note and vitals reviewed.   ED Course  Procedures (including critical care time) Labs Review Labs Reviewed  ACETAMINOPHEN LEVEL - Abnormal; Notable for the following:    Acetaminophen (Tylenol), Serum <10.0 (*)    All other components within normal limits  CBC - Abnormal; Notable for the following:    Hemoglobin 15.7 (*)    All other components within normal limits  COMPREHENSIVE METABOLIC PANEL - Abnormal; Notable for the following:    Chloride 94 (*)    Total Protein 8.5 (*)    AST 84 (*)    ALT 59 (*)    All other components within normal limits  ETHANOL - Abnormal; Notable for the following:    Alcohol, Ethyl (B) 183 (*)  All other components within normal limits  SALICYLATE LEVEL  URINE RAPID DRUG SCREEN (HOSP PERFORMED)    Imaging Review No results found.   EKG Interpretation None      MDM   Final diagnoses:  Alcohol problem drinking    59 year old female here wanting detox. History of chronic alcoholism. Just finished inpatient detox, needs more assistance. I feel she is appropriate for TTS consult. TTS feels she warrants inpatient  admission.    Elwin Mocha, MD 12/27/14 [phone]

## 2014-12-27 NOTE — BH Assessment (Addendum)
TTS Patty Cruz(Patty Cruz) will inform TTS [facility](Patty Cruz) of the consult.

## 2014-12-28 ENCOUNTER — Encounter (HOSPITAL_COMMUNITY): Payer: Self-pay

## 2014-12-28 DIAGNOSIS — F1024 Alcohol dependence with alcohol-induced mood disorder: Principal | ICD-10-CM

## 2014-12-28 MED ORDER — LOPERAMIDE HCL 2 MG PO CAPS: 2.0000 mg | ORAL_CAPSULE | ORAL | Status: DC | PRN

## 2014-12-28 MED ORDER — LORAZEPAM 1 MG PO TABS
1.0000 mg | ORAL_TABLET | Freq: Four times a day (QID) | ORAL | Status: AC
  Administered 2014-12-28 (×3): 1 mg via ORAL
  Filled 2014-12-28 (×2): qty 1

## 2014-12-28 MED ORDER — ONDANSETRON 4 MG PO TBDP: 4.0000 mg | ORAL_TABLET | Freq: Four times a day (QID) | ORAL | Status: DC | PRN

## 2014-12-28 MED ORDER — LORAZEPAM 1 MG PO TABS: 1.0000 mg | ORAL_TABLET | Freq: Every day | ORAL | Status: DC

## 2014-12-28 MED ORDER — LORAZEPAM 1 MG PO TABS: 1.0000 mg | ORAL_TABLET | Freq: Four times a day (QID) | ORAL | Status: DC | PRN

## 2014-12-28 MED ORDER — ADULT MULTIVITAMIN W/MINERALS CH
1.0000 | ORAL_TABLET | Freq: Every day | ORAL | Status: DC
  Administered 2014-12-28 – 2014-12-30 (×3): 1 via ORAL
  Filled 2014-12-28 (×6): qty 1

## 2014-12-28 MED ORDER — HYDROXYZINE HCL 25 MG PO TABS: 25.0000 mg | ORAL_TABLET | Freq: Four times a day (QID) | ORAL | Status: DC | PRN

## 2014-12-28 MED ORDER — THIAMINE HCL 100 MG/ML IJ SOLN: 100.0000 mg | Freq: Once | INTRAMUSCULAR | Status: DC

## 2014-12-28 MED ORDER — LORAZEPAM 1 MG PO TABS: 1.0000 mg | ORAL_TABLET | Freq: Two times a day (BID) | ORAL | Status: DC

## 2014-12-28 MED ORDER — LORAZEPAM 1 MG PO TABS
1.0000 mg | ORAL_TABLET | Freq: Three times a day (TID) | ORAL | Status: AC
  Administered 2014-12-29 (×3): 1 mg via ORAL
  Filled 2014-12-28 (×4): qty 1

## 2014-12-28 MED ORDER — ACAMPROSATE CALCIUM 333 MG PO TBEC
666.0000 mg | DELAYED_RELEASE_TABLET | Freq: Three times a day (TID) | ORAL | Status: DC
  Administered 2014-12-29 – 2014-12-30 (×5): 666 mg via ORAL
  Filled 2014-12-28: qty 2
  Filled 2014-12-28 (×3): qty 56
  Filled 2014-12-28: qty 2
  Filled 2014-12-28 (×2): qty 56
  Filled 2014-12-28 (×4): qty 2
  Filled 2014-12-28: qty 56
  Filled 2014-12-28: qty 2

## 2014-12-28 MED ORDER — VITAMIN B-1 100 MG PO TABS
100.0000 mg | ORAL_TABLET | Freq: Every day | ORAL | Status: DC
  Administered 2014-12-29 – 2014-12-30 (×2): 100 mg via ORAL
  Filled 2014-12-28 (×5): qty 1

## 2014-12-28 NOTE — Tx Team (Signed)
Initial Interdisciplinary Treatment Plan   PATIENT STRESSORS: Financial difficulties Loss of brother and father in the past 2 years.  Substance abuse   PATIENT STRENGTHS: Ability for insight Average or above average intelligence Capable of independent living General fund of knowledge Motivation for treatment/growth Religious Affiliation Supportive family/friends   PROBLEM LIST: Problem List/Patient Goals Date to be addressed Date deferred Reason deferred Estimated date of resolution  Etoh detox 12/28/14     Depression      Anxiety                                           DISCHARGE CRITERIA:  Improved stabilization in mood, thinking, and/or behavior Motivation to continue treatment in a less acute level of care Need for constant or close observation no longer present Reduction of life-threatening or endangering symptoms to within safe limits Verbal commitment to aftercare and medication compliance Withdrawal symptoms are absent or subacute and managed without 24-hour nursing intervention  PRELIMINARY DISCHARGE PLAN: Attend aftercare/continuing care group Attend 12-step recovery group  PATIENT/FAMIILY INVOLVEMENT: This treatment plan has been presented to and reviewed with the patient, Patty Cruz.  The patient and family have been given the opportunity to ask questions and make suggestions.  Patty Cruz A 12/28/2014, 7:33 AM

## 2014-12-28 NOTE — Progress Notes (Signed)
D:Pt reports that she feels lonely at home and wants to go to a treatment facility following discharge. Pt says that she attends AA meetings several times a week, and has a sponsor. She also has church and bible study for support. A:Offered encouragement and 15 minute checks.  R:Pt denies si and hi. Safety maintained on the unit.

## 2014-12-28 NOTE — Progress Notes (Signed)
Pt did not attend group this evening.  

## 2014-12-28 NOTE — [facility] Suicide Risk Assessment (Signed)
[facility]   Nursing information obtained from:  Patient Demographic factors:  Divorced or widowed, Caucasian, Living alone, Unemployed Current Mental Status:  NA Loss Factors:  Loss of significant relationship Historical Factors:  Anniversary of important loss Risk Reduction Factors:  Sense of responsibility to family, Positive social support Total Time spent with patient: 45 minutes Principal Problem: Alcohol dependence with alcohol-induced mood disorder Diagnosis:   Patient Active Problem List   Diagnosis Date Noted  . Alcohol dependence with alcohol-induced mood disorder [F10.24] 12/28/2014  . Alcohol dependence with uncomplicated withdrawal [F10.230] 12/27/2014  . Recurrent major depression-severe [F33.2] 12/27/2014     Continued Clinical Symptoms:  Alcohol Use Disorder Identification Test Final Score (AUDIT): 17 The "Alcohol Use Disorders Identification Test", Guidelines for Use in Primary Care, Second Edition.  World Science writerHealth Organization [facility](WHO). Score between 0-7:  no or low risk or alcohol related problems. Score between 8-15:  moderate risk of alcohol related problems. Score between 16-19:  high risk of alcohol related problems. Score 20 or above:  warrants further diagnostic evaluation for alcohol dependence and treatment.   CLINICAL FACTORS: 59 year old female, history of alcohol dependence and depression. [facility]. Drank for 3- 4 days. Reports worsening depression.     Musculoskeletal: Strength & Muscle Tone: within normal limits at this time no tremors, no diaphoresis, no psychomotor agitation Gait & Station: normal Patient leans: N/A  Psychiatric Specialty Exam: Physical Exam  ROS  Blood pressure 124/73, pulse 81, temperature 98.1 F (36.7 C), temperature source Oral, resp. rate 17, height 5' 1.5" (1.562 m), weight 103 lb (46.72 kg).Body mass index is 19.15  kg/(m^2).  General Appearance: Well Groomed  Patent attorneyye Contact::  Good  Speech:  Clear and Coherent  Volume:  Normal  Mood:  Depressed  Affect:  Constricted and but reactive  Thought Process:  Goal Directed and Linear  Orientation:  Full (Time, Place, and Person)  Thought Content:  no hallucinations, no delusions, ruminative about current psychosocial stressors  Suicidal Thoughts:  No at this time denies plan or intention of hurting self or anyone else   Homicidal Thoughts:  No  Memory:  recent and remote grossly intact   Judgement:  Fair  Insight:  Present  Psychomotor Activity:  Normal  Concentration:  Good  Recall:  Good  Fund of Knowledge:Good  Language: Good  Akathisia:  Negative  Handed:  Right  AIMS (if indicated):     Assets:  Communication Skills Desire for Improvement Resilience  Sleep:     Cognition: WNL  ADL's:  Impaired     COGNITIVE FEATURES THAT CONTRIBUTE TO RISK:  Closed-mindedness    SUICIDE RISK:   Moderate:  Frequent suicidal ideation with limited intensity, and duration, some specificity in terms of plans, no associated intent, good self-control, limited dysphoria/symptomatology, some risk factors present, and identifiable protective factors, including available and accessible social support.  PLAN OF CARE: Patient will be admitted to inpatient psychiatric unit for stabilization and safety. Will provide and encourage milieu participation. Provide medication management and maked adjustments as needed.  Will also provide medication management to minimize risk of alcohol withdrawal. Will follow daily.    Medical Decision Making:  Review of Psycho-Social Stressors (1), Review or order clinical lab tests (1), Established Problem, Worsening (2) and Review of New Medication or Change in Dosage (2)  I certify that inpatient services furnished can reasonably be expected to improve the patient's condition.  Lion Fernandez 12/28/2014, 5:00 PM
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
## 2014-12-28 NOTE — [facility] Group Notes (Signed)
[facility] LCSW Group Therapy  12/28/2014 3:27 PM  Type of Therapy:  Group Therapy  Participation Level:  Active  Participation Quality:  Attentive  Affect:  Appropriate  Cognitive:  Alert and Oriented  Insight:  Improving  Engagement in Therapy:  Engaged  Modes of Intervention:  Confrontation, Discussion, Education, Exploration, Problem-solving, Rapport Building, Socialization and Support  Summary of Progress/Problems: Today's Topic: Overcoming Obstacles. Pt identified obstacles faced currently and processed barriers involved in overcoming these obstacles. Pt identified steps necessary for overcoming these obstacles and explored motivation (internal and external) for facing these difficulties head on. Pt further identified one area of concern in their lives and chose a skill of focus pulled from their "toolbox." Patty Cruz was attentive and engaged during today's processing group. She shared that her obstacle involves maintaining sobriety and getting into a sober living place. "It's hard for me to ask for help and reach out to people, even though I have people that are willing to help me." Patty Cruz continues to show progress in the group setting and improving insight AEB her ability to identify people that are positive supports and share her plan for discharge and recovery-[facility]sponsor, [facility], [facility], and reaching out to family when needing emotional support.    Smart, Hue Frick LCSWA  12/28/2014, 3:27 PM

## 2014-12-28 NOTE — [facility] Suicide Risk Assessment (Signed)
[facility] INPATIENT: Family/Significant Other Suicide Prevention Education  Suicide Prevention Education:  Education Completed; No one has been identified by the patient as the family member/significant other with whom the patient will be residing, and identified as the person(s) who will aid the patient in the event of a mental health crisis (suicidal ideations/suicide attempt).   Pt did not c/o SI at admission, nor have they endorsed SI during their stay here. SPE not required. SPI pamphlet provided to pt and they were encouraged to share information with support network, ask questions, and talk about any concerns relating to SPE.  The Sherwin-WilliamsHeather Smart, LCSWA 12/28/2014 12:59 PM

## 2014-12-28 NOTE — Progress Notes (Addendum)
Pt is a 59 yr old female vol admitted for ETOH detox. Pt presents with mild nausea, tremors (2), and anxiety. Pt reports that she was recently at [facility] where she relapsed after being discharged. Pt reports being sober for 2 years prior to this relapse. Pt reports having depression, anxiety, loneliness, and low self-esteem. She is currently denying any SI/HI/AVH. She is compliant with her current home medications. She reports drinking a "small bottle of wine" during the week and a "large" bottle of wine during the weekend. ETOH level was 183 on admission. She denies any other drug use. Pt reports having a brother that died this past November from alcohol related issues. Pt loss her father 2 years ago in November. Pt reports that she is having financial problems as another stressor. Pt verbalizes having a strong social support sytem.  Pt's is seeking placement into a "Halfway house".

## 2014-12-28 NOTE — BHH Counselor (Signed)
Adult Comprehensive Assessment  Patient ID: Patty Cruz, female   DOB: 1956/02/26, 59 y.o.   MRN: [id number]  Information Source: Information source: Patient  Current Stressors:  Physical health (include injuries & life threatening diseases): none identified  Substance abuse: alchol abuse-recent relapse after leaving inpatient treatment at [facility]. longest period of sobriety 2 years. struggling with alcoholism for 25 years. drinks few glasses per night of wine during week/one large bottle on weekends. no other drug use identified.  Bereavement / Loss: brother died in Nov 08, 2015of alchol related issues.   Living/Environment/Situation:  Living Arrangements: Alone Living conditions (as described by patient or guardian): lives in home by herself since her divorce 6 years ago. has been living in the same home for 11 years How long has patient lived in current situation?: safe and comfortable. 11 years.  What is atmosphere in current home: Comfortable  Family History:  Marital status: Divorced Divorced, when?: 6 years  What types of issues is patient dealing with in the relationship?: alcohol abuse/not getting along Additional relationship information: n/a  Does patient have children?: Yes How many children?: 3 How is patient's relationship with their children?: one son 30; daughter 40; daughter 24-close with all her children. all children live closeby and are aware of her hospitalization.   Childhood History:  By whom was/is the patient raised?: Both parents Additional childhood history information: parents were married. no s/a or mental health issues with parents. close and loving family. no abuse reported Description of patient's relationship with caregiver when they were a child: close to both parents Patient's description of current relationship with people who raised him/her: close to both parents until they passed away several years ago.  Does patient have siblings?: Yes Number of  Siblings: 3 Description of patient's current relationship with siblings: brother died in 2015/11/08due to alcohol related health problems. close to surviving siblings (brother and sister). neither have problems with MH or S/A.  Did patient suffer any verbal/emotional/physical/sexual abuse as a child?: No Did patient suffer from severe childhood neglect?: No Has patient ever been sexually abused/assaulted/raped as an adolescent or adult?: No Was the patient ever a victim of a crime or a disaster?: No Witnessed domestic violence?: No Has patient been effected by domestic violence as an adult?: No  Education:  Highest grade of school patient has completed: 4 year degree in chemistry.  Currently a student?: No Learning disability?: No  Employment/Work Situation:   Employment situation: Unemployed (recently lost job "the day I went inpatient at [facility] last month") Patient's job has been impacted by current illness: Yes Describe how patient's job has been impacted: pt reports that she was functioning alcholic for several years-lost job when she decided to go into treatment.  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 2 years of sobriety from 2010-2012. recenlty left [facility]-relapsed that night. drinks daily-few glasses of wine. "more on weekends" about one bottle of wine on weekends. no drug use.  If attempted suicide, did drugs/alcohol play a role in this?: No Alcohol/Substance Abuse Treatment Hx: Past Tx, Inpatient, Past detox, Attends AA/NA If  yes, describe treatment: [facility] in 2010 and 2016 (Jan)-both for 28  days. several past detoxes "at the hospital." attends AA and has sponsor.  Has alcohol/substance abuse ever caused legal problems?: No  Social Support System:   Patient's Community Support System: Good Describe Community Support System: pt reports good support network of friends in the community. Type of faith/religion: christian How does patient's faith help to cope with current illness?: n/a  Leisure/Recreation:   Leisure and Hobbies: aerobics; walking, reading, cooking, spending time with my kids.   Strengths/Needs:   What things does the patient do well?: hard worker; motivated to stop drinking and get help In what areas does patient struggle / problems for patient: coping with stress; fighting cravings   Discharge Plan:   Does patient have access to transportation?: Yes (car and license. "I don't like to drive at night.' ) Will patient be returning to same living situation after discharge?: No Plan for living situation after discharge: pt is able to return to her home but is hoping to get into sober living "[facility]." Pt researching and calling around while at the hospital.  Currently receiving community mental health services: Yes (From Whom) (PCP-Dr. Michel HarrowMark Christman ) If no, would patient like referral for services when discharged?: No (pt not interested in SA IOP or therapy at this time. pt prefers AA and has sponsor) Does patient have financial barriers related to discharge medications?: No (currently pt has insurance "limited" and savings to pay for medications if necessary)  Summary/Recommendations:    Pt is 59 year old female living [place]. [facility] for [facility]TOH detox, depression/mood instability, and for medication stabilization. Pt denies SI/HI/AVH. Pt reports that she recently left [facility] and relapsed immediately. Pt reports that her longest period of sobriety was 2 years starting in 2010 after her first admission to [facility].  Recommendations for pt include: crisis stabilization, therapeutic milieu, encourage group attendance and participation, ativan taper for withdrawals, medication management for mood stabilization, and development of comprehensive mental wellness/sobriety plan. Pt goes to her PCP for mental health meds and goes to AA/has sponsor. Pt is not interested in therapy or SA IOP at this time "because my insurance will probably be cut off soon." Pt interested in [facility] admission and was given list of [place] in [place], and was encouraged by CSW to begin calling.   Smart, San RafaelHeather LCSWA 12/28/2014

## 2014-12-28 NOTE — Progress Notes (Signed)
Patient appeared to be doing well. She denied SI/HI, denied Hallucinations and denied withdrawals. Writer encouraged and supported patient. Administered HS medication as scheduled. Q 15 minute check continues as ordered to maintain safety.

## 2014-12-28 NOTE — BHH Group Notes (Signed)
[facility] L[facility] After[facility] Discharge Planning Group Note   12/28/2014 9:31 AM  Participation Quality:  Appropriate/minimal   Mood/Affect:  Depressed and Flat  Depression Rating:  3  Anxiety Rating:  0  Thoughts of Suicide:  No Will you contract for safety?   NA  Current AVH:  No  Plan for Discharge/Comments:  Pt reports that [place] and does not follow-up [facility]. Pt reports no withdrawals today and plans to look into moving into [facility] at d/c. Pt has PCP Dr. Michel HarrowMark Christman that has been prescribing her mental health meds.   Transportation Means: not known currently.   Supports: family and friends   Smart, OncologistHeather LCSWA

## 2014-12-28 NOTE — Tx Team (Signed)
Interdisciplinary Treatment Plan Update (Adult)   Date: 12/28/2014  Time Reviewed:8:36 AM  Progress in Treatment:  Attending groups: Yes  Participating in groups:  Yes  Taking medication as prescribed: Yes  Tolerating medication: Yes  Family/Significant othe contact made: No. SPE not required for this pt.  Patient understands diagnosis: Yes, AEB seeking treatment for ETOH detox, depression/mood instability, and for medication stabilization.  Discussing patient identified problems/goals with staff: Yes  Medical problems stabilized or resolved: Yes  Denies suicidal/homicidal ideation: Yes during admission/group/self report.  Patient has not harmed self or Others: Yes  New problem(s) identified:  Discharge Plan or Barriers: Pt hoping to get into [facility] at d/c-CSW provided pt with comprehensive OH list and encouraged her to begin calling. Currently, she sees Dr. Michel HarrowMark Christman for mental health meds-PCP. CSW assessing. Pt unsure about status of insurance due to recent job loss and is not wanting referral for SA IOP at this time. Additional comments: Patty Cruz is an 59 y.o. female who came to the Emergency Department after a relapse from alcohol abuse. She stated that she was in treatment for a month at [facility] and got back on Wednesday of last week when she began drinking again. She presents as very depressed with slow speech and thoughts of hopelessness and worthlessness. She states that she denies SI thoughts but provider is concerned that if alcohol use continues she may develop them. Her brother just passed from a alcohol induced accident in the shower in November and she is afraid that she is going to die from her addiction. She states that she was on Effexor for her depression but stopped taking it last Wednesday when she started drinking again. She states that she uses drinking to get "a high" to help her cope with her depression. She says that she recently lost her job due to her  alcohol abuse which is contributing to her thoughts of hopelessness and worthlessness. Pt denies HI, and A/V hallucinations to prior history of violence or aggression. Pt was in rehab in 2010 where she was able to stay sober for 2 years. Pt states it is hard for her to take care of herself right now and has decreased grooming and eating. She has not eaten in 3 days and states she has lost 5 pounds. She currently feels lethargic and nauseas.  Reason for Continuation of Hospitalization: ETOH detox/withdrawals Mood stabilization/depression Medication management  Estimated length of stay: 3-5 days  For review of initial/current patient goals, please see plan of care.  Attendees:  Patient:    Family:    Physician: Geoffery LyonsIrving Lugo MD 12/28/2014 8:38 AM   Nursing: Lowella PettiesJan, Ronecia, RN 12/28/2014 8:38 AM   Clinical Social Worker Wiliam Cauthorn Smart, LCSWA  12/28/2014 8:38 AM   Other: Vikki PortsValerieVesta Mixer: [facility]  12/28/2014 8:38 AM   Other: Darden DatesJennifer C. Nurse CM 12/28/2014 8:38 AM   Other: Liliane Badeolora Sutton, Community Care Coordinator  12/28/2014 8:38 AM   Other:  12/28/2014 8:39 AM   Scribe for Treatment Team:  Herbert SetaHeather Smart LCSWA 12/28/2014 8:39 AM

## 2014-12-29 MED ORDER — DIPHENHYDRAMINE HCL 25 MG PO CAPS: 25.0000 mg | ORAL_CAPSULE | Freq: Every evening | ORAL | Status: DC | PRN

## 2014-12-29 MED ORDER — VENLAFAXINE HCL ER 150 MG PO CP24
150.0000 mg | ORAL_CAPSULE | Freq: Every day | ORAL | Status: DC
  Administered 2014-12-30: 150 mg via ORAL
  Filled 2014-12-29 (×2): qty 7
  Filled 2014-12-29 (×2): qty 1

## 2014-12-29 MED ORDER — ENSURE COMPLETE PO LIQD
237.0000 mL | ORAL | Status: DC
  Administered 2014-12-29: 237 mL via ORAL

## 2014-12-29 NOTE — Progress Notes (Signed)
NUTRITION ASSESSMENT  Pt identified as at risk on the Malnutrition Screen Tool  INTERVENTION: 1. Educated patient on the importance of nutrition and encouraged intake of food and beverages. 2. Discussed weight goals. 3. Supplements: Ensure Complete po daily, each supplement provides 350 kcal and 13 grams of protein  NUTRITION DIAGNOSIS: Unintentional weight loss related to sub-optimal intake as evidenced by pt report.   Goal: Pt to meet >/= 90% of their estimated nutrition needs.  Monitor:  PO intake  Assessment:  Pt admitted with ETOH abuse and depression.  Pt reports good appetite and eating well prior to leaving [facility]. Once she left she did not eat at all. Pt drinks 1 pint of ETOH during the week and more than that on the weekends.   Pt states her UBW is 108-109 lb, pt with 5 lb weight loss.   Pt is willing to try a Ensure supplement at nighttime. RD to order.  Height: Ht Readings from Last 1 Encounters:  12/27/14 5' 1.5" (1.562 m)    Weight: Wt Readings from Last 1 Encounters:  12/27/14 103 lb (46.72 kg)    Weight Hx: Wt Readings from Last 10 Encounters:  12/27/14 103 lb (46.72 kg)  09/29/13 112 lb (50.803 kg)    BMI:  Body mass index is 19.15 kg/(m^2). Pt meets criteria for normal range based on current BMI.  Estimated Nutritional Needs: Kcal: 30-35 kcal/kg Protein: > 1 gram protein/kg Fluid: 1 ml/kcal  Diet Order: Diet Heart Pt is also offered choice of unit snacks mid-morning and mid-afternoon.  Pt is eating as desired.   Lab results and medications reviewed.   Tilda FrancoLindsey Jayliah Benett, MS, RD, LDN Pager: [phone] After Hours Pager: [phone]

## 2014-12-29 NOTE — Progress Notes (Signed)
Pt did not attend karaoke group this evening.  

## 2014-12-29 NOTE — Progress Notes (Signed)
B[facility] Group Notes:  (Nursing/MHT/Case Management/Adjunct)  Date:  12/29/2014  Time:  9:39 AM  Type of Therapy:  Group Therapy Goals group  Participation Level:  Active  Participation Quality:  Appropriate  Affect:  Depressed  Cognitive:  Alert and Appropriate  Insight:  Improving  Engagement in Group:  Engaged  Modes of Intervention:  Clarification, Discussion and Support  Summary of Progress/Problems:Pt's long term goal is to move out of her house as she reports it is a trigger for her depression.  Beatrix ShipperWright, Meggan Dhaliwal Martin 12/29/2014, 9:39 AM

## 2014-12-29 NOTE — Progress Notes (Signed)
D:Pt reports that she wants to move from her house as it is a trigger for her drinking/depression. Pt is considering other alternatives to going home at discharge. She rates depression and hopelessness as a 0. A:Offered support, encouragement and 15 minute checks.  R:Pt denies si and hi. Safety maintained on the unit.

## 2014-12-29 NOTE — Progress Notes (Signed)
D: Client is pleasant, reports "going home tomorrow" visible on the unit, interacts appropriately with staff and peers.Denies depression, SHI.  A: Writer introduces self to client provided emotional support. Staff will monitor q1915min for safety. R: Client is safe on the unit, did not attend karaoke.

## 2014-12-29 NOTE — Progress Notes (Addendum)
[facility] MD Progress Note  12/29/2014 4:31 PM Patty Cruz  MRN:  [id number] Subjective:   Patient states she is feeling better today. She is not currently endorsing any significant alcohol withdrawal symptoms. She is tolerating medications well and denies side effects. Objective : I have discussed case with treatment team and have met with patient. Patient does present with some improvement compared to admission. Although denies depression , she does appear somewhat sad and constricted in affect, although affect is reactive. Behavior on unit in good control- no disruptive or agitated behaviors and has been going to groups and participating in milieu. She states she is considering returning home after discharge, although with a plan of moving out to a smaller home or other living arrangement in the near future.. I have encouraged her to look into options such as  Going to a rehab or to a sober setting such as an [place] or [place]. She complains of insomnia as an ongoing issue. States neither Trazodone nor Remeron, which she received while at [facility] , were particularly helpful. We reviewed the importance of going to [place] regularly. Patient has been involved in [place] in the past, has sponsor, and states it has helped her stay sober in  The past .  Principal Problem: Alcohol dependence with alcohol-induced mood disorder Diagnosis:   Patient Active Problem List   Diagnosis Date Noted  . Alcohol dependence with alcohol-induced mood disorder [F10.24] 12/28/2014  . Alcohol dependence with uncomplicated withdrawal [F75.102] 12/27/2014  . Recurrent major depression-severe [F33.2] 12/27/2014   Total Time spent with patient: 30 minutes   Past Medical History: History reviewed. No pertinent past medical history.  Past Surgical History  Procedure Laterality Date  . Cesarean section  1991  . Wrist surgery Right   . Appendectomy     Family History: History reviewed. No pertinent family  history. Social History:  History  Alcohol Use No     History  Drug Use No    History   Social History  . Marital Status: Married    Spouse Name: N/A  . Number of Children: N/A  . Years of Education: N/A   Social History Main Topics  . Smoking status: Never Smoker   . Smokeless tobacco: Never Used  . Alcohol Use: No  . Drug Use: No  . Sexual Activity: Not on file   Other Topics Concern  . None   Social History Narrative   Additional History:    Sleep: Fair  Appetite:  improving   Assessment:   Musculoskeletal: Strength & Muscle Tone: within normal limits- no significant current tremors or diaphoresis Gait & Station: normal Patient leans: N/A   Psychiatric Specialty Exam: Physical Exam  ROS- no headache, no chest pain, no shortness of breath, no rash, no fever, no chills.   Blood pressure 113/73, pulse 80, temperature 98.3 F (36.8 C), temperature source Oral, resp. rate 16, height 5' 1.5" (1.562 m), weight 103 lb (46.72 kg).Body mass index is 19.15 kg/(m^2).  General Appearance: improved grooming  Eye Contact::  Good  Speech:  Normal Rate  Volume:  Normal  Mood:  improving and today minimizes depression  Affect:  improved but still somewhat constricted   Thought Process:  Goal Directed and Linear  Orientation:  Full (Time, Place, and Person)  Thought Content:  no hallucinations, no delusions  Suicidal Thoughts:  No at this time denies any thoughts of hurting self and  contracts for safety on unit   Homicidal Thoughts:  No  Memory:  Recent and Remote grossly intact  Judgement:  Other:  improved   Insight:  Present  Psychomotor Activity:  Normal- no psychomotor restlessness or distal tremors noted at this time  Concentration:  Good  Recall:  Good  Fund of Knowledge:Good  Language: Good  Akathisia:  Negative  Handed:  Right  AIMS (if indicated):     Assets:  Communication Skills Desire for Improvement Resilience  ADL's:  Improved   Cognition: WNL   Sleep:  Number of Hours: 6     Current Medications: Current Facility-Administered Medications  Medication Dose Route Frequency Provider Last Rate Last Dose  . acamprosate (CAMPRAL) tablet 666 mg  666 mg Oral TID WC Jenne Campus, MD   666 mg at 12/29/14 1203  . acetaminophen (TYLENOL) tablet 650 mg  650 mg Oral Q6H PRN Waylan Boga, NP      . alum & mag hydroxide-simeth (MAALOX/MYLANTA) 200-200-20 MG/5ML suspension 30 mL  30 mL Oral Q4H PRN Waylan Boga, NP      . feeding supplement (ENSURE COMPLETE) (ENSURE COMPLETE) liquid 237 mL  237 mL Oral Q24H Clayton Bibles, RD      . hydrOXYzine (ATARAX/VISTARIL) tablet 25 mg  25 mg Oral Q6H PRN Nicholaus Bloom, MD      . loperamide (IMODIUM) capsule 2-4 mg  2-4 mg Oral PRN Nicholaus Bloom, MD      . LORazepam (ATIVAN) tablet 1 mg  1 mg Oral Q6H PRN Nicholaus Bloom, MD      . LORazepam (ATIVAN) tablet 1 mg  1 mg Oral TID Nicholaus Bloom, MD   1 mg at 12/29/14 1203   Followed by  . [START ON 12/30/2014] LORazepam (ATIVAN) tablet 1 mg  1 mg Oral BID Nicholaus Bloom, MD       Followed by  . [START ON 12/31/2014] LORazepam (ATIVAN) tablet 1 mg  1 mg Oral Daily Nicholaus Bloom, MD      . magnesium hydroxide (MILK OF MAGNESIA) suspension 30 mL  30 mL Oral Daily PRN Waylan Boga, NP      . multivitamin with minerals tablet 1 tablet  1 tablet Oral Daily Nicholaus Bloom, MD   1 tablet at 12/29/14 [id number]  . ondansetron (ZOFRAN-ODT) disintegrating tablet 4 mg  4 mg Oral Q6H PRN Nicholaus Bloom, MD      . thiamine (VITAMIN B-1) tablet 100 mg  100 mg Oral Daily Nicholaus Bloom, MD   100 mg at 12/29/14 0806  . venlafaxine XR (EFFEXOR-XR) 24 hr capsule 75 mg  75 mg Oral Q breakfast Waylan Boga, NP   75 mg at 12/29/14 0806  . white petrolatum (VASELINE) gel   Topical PRN Waylan Boga, NP        Lab Results: No results found for this or any previous visit (from the past 48 hour(s)).  Physical Findings: AIMS:  , ,  ,  ,    CIWA:  CIWA-Ar Total: 0 COWS:      Assessment- at  this time patient is improved and currently minimizes depression or severe anxiety. Still somewhat constricted in affect. Tolerating medications well. No current significant alcohol withdrawal symptoms.  Treatment Plan Summary: Daily contact with patient to assess and evaluate symptoms and progress in treatment, Medication management, Plan Continue inpatient treatment  and continue medications as below Increase Effexor XR to 150 mgrs QAM Continue Campral 666 mgrs TID Diphenhydramine 25 mgrs QHS PRN Insomnia. Ativan alcohol detox protocol  Encourage patient to consider options of going to sober supportive setting such as [facility] or [facility] after discharge to decrease risk of relapse. Medical Decision Making:  Established Problem, Stable/Improving (1), Review of Psycho-Social Stressors (1), Review or order clinical lab tests (1), Review of Medication Regimen & Side Effects (2) and Review of New Medication or Change in Dosage (2)     Brinson Tozzi 12/29/2014, 4:31 PM

## 2014-12-29 NOTE — BHH Group Notes (Signed)
[facility] LCSW Group Therapy  12/29/2014 2:12 PM  Type of Therapy:  Group Therapy  Participation Level:  Active  Participation Quality:  Attentive  Affect:  Appropriate  Cognitive:  Alert and Oriented  Insight:  Engaged  Engagement in Therapy:  Engaged  Modes of Intervention:  Confrontation, Discussion, Education, Exploration, Problem-solving, Rapport Building, Socialization and Support  Summary of Progress/Problems:  Finding Balance in Life. Today's group focused on defining balance in one's own words, identifying things that can knock one off balance, and exploring healthy ways to maintain balance in life. Group members were asked to provide an example of a time when they felt off balance, describe how they handled that situation,and process healthier ways to regain balance in the future. Group members were asked to share the most important tool for maintaining balance that they learned [facility] and how they plan to apply this method after discharge. Erskine SquibbJane was attentive and engaged during today's processing group. She shared that her plan has changed and she is going to be living with a friend who is a recovering addict. Pt reports that she is [facility] and return to AA. "I have a sponsor." Erskine SquibbJane shows progress in the group setting and improving insight AEB her ability to identify things that have worked for her in the past in terms of maintaining her sobriety and a sense of balance in life. Erskine SquibbJane shared that "I need to keep the phone close and call when I feel I am struggling. Church is really important to me too and I plan to get back into my church and involved."    Smart, Rolling FieldsHeather LCSWA  12/29/2014, 2:12 PM

## 2014-12-30 ENCOUNTER — Encounter (HOSPITAL_COMMUNITY): Payer: Self-pay | Admitting: Registered Nurse

## 2014-12-30 MED ORDER — ACAMPROSATE CALCIUM 333 MG PO TBEC: 666.0000 mg | DELAYED_RELEASE_TABLET | Freq: Three times a day (TID) | ORAL | Status: DC

## 2014-12-30 MED ORDER — VENLAFAXINE HCL ER 150 MG PO CP24: 150.0000 mg | ORAL_CAPSULE | Freq: Every day | ORAL | Status: DC

## 2014-12-30 NOTE — Progress Notes (Signed)
  [facility] :  Will you be returning to the same living situation after discharge:  Yes,  home At discharge, do you have transportation home?: Yes,  brother Do you have the ability to pay for your medications: Yes,  coventry insurance  Release of information consent forms completed and submitted to medical records by CSW.  Patient to Follow up at: Follow-up Information    [facility] 01/09/2015.   Why:  Appt with Dr. Laural BenesJohnson on this date at 10AM. (Dr. Dossie Arbourrissman has no openings until July). Please bring photo ID and insurance card to this appt.    Contact information:   ATTN: Dr. Laural BenesJohnson [address] [place], [place] [address] Phone: [phone] Fax: [phone]      Patient denies SI/HI: Yes,  during admission, group, and self report.     Safety Planning and Suicide Prevention discussed: Yes,  SPE not required for this pt. SPI pamphlet was provided to pt and she was encouraged to share information with support network, ask questions, and talk about any concerns relating to SPE.  Have you used any form of tobacco in the last 30 days? (Cigarettes, Smokeless Tobacco, Cigars, and/or Pipes): No  Has patient been referred to the Quitline?: N/A patient is not a smoker  Smart, BabcockHeather LCSWA  12/30/2014, 10:56 AM

## 2014-12-30 NOTE — Progress Notes (Signed)
D: Patient is A&Ox4, denies SI/HI and A/V hallucinations. Patient stated she is ready for discharge and that she is going home with her husband.   A: Medications given as scheduled and prn. Emotional support given as needed. Continue q15 minute checks for safety.  R: Patient remains safe. Patient verbalizes to find staff if she feels like hurting herself or anyone else.  Olufemi Mofield, Wyman SongsterAngela Marie, RN

## 2014-12-30 NOTE — Plan of Care (Signed)
Problem: Alteration in mood Goal: LTG-Pt's behavior demonstrates decreased signs of depression Pt rates depression as 3/10 and presents with depressed mood and flat affect. Pt denies SI/HI/AVH upon admission and in group. By d/c, pt presentation will correspond to self report. Goal not met. Jerauld, LCSWA 12/28/2014 10:25 AM  Outcome: Progressing Client denies depression, calm, excited about discharge, notes positive support system.

## 2014-12-30 NOTE — Progress Notes (Signed)
Patient has been discharged home with husband. Patient verbalized understanding of discharge instructions and follow up, no further questions at this time. Patient was sent home with all belongings, written AVS, scripts, and survey. Patient stated she was happy with her stay here and felt ready to go home.  Aishani Kalis, Wyman SongsterAngela Marie, RN

## 2014-12-30 NOTE — BHH Suicide Risk Assessment (Signed)
[facility]   Demographic Factors:  59 year old divorced female,  Three adult children, recently unemployed   Total Time spent with patient: 30 minutes  Musculoskeletal: Strength & Muscle Tone: within normal limits Gait & Station: normal Patient leans: N/A  Psychiatric Specialty Exam: Physical Exam  ROS  Blood pressure 115/82, pulse 113, temperature 97.6 F (36.4 C), temperature source Oral, resp. rate 16, height 5' 1.5" (1.562 m), weight 103 lb (46.72 kg).Body mass index is 19.15 kg/(m^2).  General Appearance: Well Groomed  Patent attorneyye Contact::  Good  Speech:  Normal Rate409  Volume:  Normal  Mood:  Euthymic  Affect:  Appropriate and Congruent  Thought Process:  Goal Directed and Linear  Orientation:  Full (Time, Place, and Person)  Thought Content:  no hallucinations, no delusions  Suicidal Thoughts:  No  Homicidal Thoughts:  No  Memory:  recent and remote grossly intact   Judgement:  Other:  improved   Insight:  Present  Psychomotor Activity:  Normal  Concentration:  Good  Recall:  Good  Fund of Knowledge:Good  Language: Good  Akathisia:  Negative  Handed:  Right  AIMS (if indicated):     Assets:  Communication Skills Desire for Improvement Resilience  Sleep:  Number of Hours: 6.75  Cognition: WNL  ADL's:improved    Have you used any form of tobacco in the last 30 days? (Cigarettes, Smokeless Tobacco, Cigars, and/or Pipes): No  Has this patient used any form of tobacco in the last 30 days? (Cigarettes, Smokeless Tobacco, Cigars, and/or Pipes) No  Mental Status Per Nursing Assessment::   On Admission:  NA  Current Mental Status by Physician: At this time she is much improved compared to admission. Her mood is euthymic, her affect is bright, no thought disorder, no suicidal or homicidal ideations,  Future oriented. No residual or ongoing alcohol withdrawal symptoms.  Loss Factors: Recent loss of job    Historical Factors: History of  alcohol dependence, has had long periods of sobriety. History of depression.  Risk Reduction Factors:   Sense of responsibility to family and NA  Continued Clinical Symptoms:  As above, ,much improved , no SI or HI, not currently depressed and no current withdrawal symptoms.  Cognitive Features That Contribute To Risk:  No gross cognitive deficits noted upon discharge. Is alert , attentive, and oriented x 3     Suicide Risk:  Mild:  Suicidal ideation of limited frequency, intensity, duration, and specificity.  There are no identifiable plans, no associated intent, mild dysphoria and related symptoms, good self-control (both objective and subjective assessment), few other risk factors, and identifiable protective factors, including available and accessible social support.  Principal Problem: Alcohol dependence with alcohol-induced mood disorder Discharge Diagnoses:  Patient Active Problem List   Diagnosis Date Noted  . Alcohol dependence with alcohol-induced mood disorder [F10.24] 12/28/2014  . Alcohol dependence with uncomplicated withdrawal [F10.230] 12/27/2014  . Recurrent major depression-severe [F33.2] 12/27/2014    Follow-up Information    [facility].   Why:  Messages left requesting appt for follow-up. The office will contact you directly at discharge to schedule appt. If you do not get call by Monday, please call the office to schedule follow-up appt. Thank you.    Contact information:   ATTN: Dr. Vonita MossMark [facility] [address] [place], [place] [address] Phone: [phone] Fax: [phone]      Plan Of Care/Follow-up recommendations:  Activity:  As tolerated Diet:  Regular Tests:  NA Other:  See below  Is patient on multiple antipsychotic therapies at discharge:  No   Has Patient had three or more failed trials of antipsychotic monotherapy by history:  No  Recommended Plan for Multiple Antipsychotic Therapies: NA Leaving  unit in good spirits. Patient plans to return home.  She is planning to go to an IOP program in [place] that focuses on alcohol abuse treatment. Plans to go to AA regularly and to communicate with her sponsor on a regular basis.     Amya Hlad 12/30/2014, 10:15 AM

## 2014-12-30 NOTE — Clinical Social Work Note (Signed)
Per pt and MD request, CSW contacted pt's brother Merrily PewRichard Rhodes to discuss his concerns regarding pt's d/c. [phone]. CSW explained that pt has right to make decision to return home rather than go to treatment and that we cannot force pt to go to [facility] or for further i/p treatment. CSW reviewed pt's d/c plan (AA meetings daily; med management with PCP and appt date/time) and informed him that pt is working with [facility]. He was also notified that pt has been given AA list for her county and comprehensive [facility] list. Pt's brother verbalized understanding of the above and was given CSW's direct line to call if he had further questions.   The Sherwin-WilliamsHeather Smart, LCSWA 12/30/2014 11:28 AM

## 2014-12-30 NOTE — Discharge Summary (Signed)
Physician Discharge Summary Note  Patient:  Patty Cruz is an 59 y.o., female MRN:  [id number] DOB:  06-10-1956 Patient phone:  [phone] (home)  Patient address:   [address] [place] [address],  Total Time spent with patient: Greater than 30 minutes  Date of Admission:  12/27/2014 Date of Discharge: 12/30/2014  Reason for Admission:  Patient states "I just got out of [facility] Thursday and I started drinking the day I got out. I got to move out of my house. I think it is my trigger it is to big; I lost my job the day I went into [facility] cause my boss didn't think saving my life was more important than me showing up for work." Patient states that she does have money saved up that can help pay her bills but wants to sell her house and is looking for a "half way house for now." Patient state that she drinks more on weekend than during the week. Patient states that during the week she drinks about a pint of alcohol and more on the week end. Patient states that she attends AA meetings any where from 6 - 11 times a week and also has a sponsor. Patient states that she has been in rehab a total of 3 times "this will make my 4th." Patient Effexor-XR is prescribed by her primary care doctor. Patient denies suicidal ideation but feels that alcohol will be the cause of her death ( her brother died related to accident in shower while intoxicated). Patient denies homicidal ideation, psychosis, and paranoia.   Principal Problem: Alcohol dependence with alcohol-induced mood disorder Discharge Diagnoses: Patient Active Problem List   Diagnosis Date Noted  . Alcohol dependence with alcohol-induced mood disorder [F10.24] 12/28/2014  . Alcohol dependence with uncomplicated withdrawal [F10.230] 12/27/2014  . Recurrent major depression-severe [F33.2] 12/27/2014    Musculoskeletal: Strength & Muscle Tone: within normal limits Gait & Station: normal Patient leans:  N/A  Psychiatric Specialty Exam:  See Suicide Risk Assessment Physical Exam  ROS  Blood pressure 115/82, pulse 113, temperature 97.6 F (36.4 C), temperature source Oral, resp. rate 16, height 5' 1.5" (1.562 m), weight 46.72 kg (103 lb).Body mass index is 19.15 kg/(m^2).    Past Medical History: History reviewed. No pertinent past medical history.  Past Surgical History  Procedure Laterality Date  . Cesarean section  1991  . Wrist surgery Right   . Appendectomy     Family History: History reviewed. No pertinent family history. Social History:  History  Alcohol Use No     History  Drug Use No    History   Social History  . Marital Status: Married    Spouse Name: N/A  . Number of Children: N/A  . Years of Education: N/A   Social History Main Topics  . Smoking status: Never Smoker   . Smokeless tobacco: Never Used  . Alcohol Use: No  . Drug Use: No  . Sexual Activity: Not on file   Other Topics Concern  . None   Social History Narrative    Risk to Self: Is patient at risk for suicide?: No What has been your use of drugs/alcohol within the last 12 months?: alcohol abuse for past 25 years. 2 years of sobriety from 2010-2012. [facility]-relapsed that night. drinks daily-few glasses of wine. "more on weekends" about one bottle of wine on weekends. no drug use.  Risk to Others:   Prior Inpatient Therapy:  Prior Outpatient Therapy:    Level of Care:  OP  Hospital Course:  Benjaman LobeJane S Selvy was admitted for Alcohol dependence with alcohol-induced mood disorder and crisis management.  She was treated discharged with the medications listed below under Medication List.  Medical problems were identified and treated as needed.  Home medications were restarted as appropriate.  Improvement was monitored by observation and Benjaman LobeJane S Robak daily report of symptom reduction.  Emotional and mental status was monitored by daily self-inventory reports completed by  Benjaman LobeJane S Cooperwood and clinical staff.         Benjaman LobeJane S Heinemann was evaluated by the treatment team for stability and plans for continued recovery upon discharge.  Benjaman LobeJane S Steinhilber motivation was an integral factor for scheduling further treatment.  Employment, transportation, bed availability, health status, family support, and any pending legal issues were also considered during her hospital stay.  She was offered further treatment options upon discharge including but not limited to Residential, Intensive Outpatient, and Outpatient treatment.  Benjaman LobeJane S Marlatt will follow up with the services as listed below under Follow Up Information.     Upon completion of this admission the patient was both mentally and medically stable for discharge denying suicidal/homicidal ideation, auditory/visual/tactile hallucinations, delusional thoughts and paranoia.      Consults:  psychiatry  Significant Diagnostic Studies:  labs: UDS, EYOH, CMET, CBC  Discharge Vitals:   Blood pressure 115/82, pulse 113, temperature 97.6 F (36.4 C), temperature source Oral, resp. rate 16, height 5' 1.5" (1.562 m), weight 46.72 kg (103 lb). Body mass index is 19.15 kg/(m^2). Lab Results:   No results found for this or any previous visit (from the past 72 hour(s)).  Physical Findings: AIMS:  , ,  ,  ,    CIWA:  CIWA-Ar Total: 0 COWS:      See Psychiatric Specialty Exam and Suicide Risk Assessment completed by Attending Physician prior to discharge.  Discharge destination:  Home  Is patient on multiple antipsychotic therapies at discharge:  No   Has Patient had three or more failed trials of antipsychotic monotherapy by history:  No    Recommended Plan for Multiple Antipsychotic Therapies: NA     Medication List    TAKE these medications      Indication   acamprosate 333 MG tablet  Commonly known as:  CAMPRAL  Take 2 tablets (666 mg total) by mouth 3 (three) times daily with meals.   Indication:  Excessive Use of Alcohol      venlafaxine XR 150 MG 24 hr capsule  Commonly known as:  EFFEXOR-XR  Take 1 capsule (150 mg total) by mouth daily with breakfast.   Indication:  Major Depressive Disorder           Follow-up Information    Follow up with [facility] On 01/09/2015.   Why:  Appt with Dr. Laural BenesJohnson on this date at 10AM. (Dr. Dossie Arbourrissman has no openings until July). Please bring photo ID and insurance card to this appt.    Contact information:   ATTN: Dr. Laural BenesJohnson [address] [place], [place] [address] Phone: [phone] Fax: [phone]      Follow-up recommendations:  Activity:  As tolerated Diet:  As tolerated  Comments:   Patient has been instructed to take medications as prescribed; and report adverse effects to outpatient provider.  Follow up with primary doctor for any medical issues and If symptoms recur report to nearest emergency or crisis hot line.  Total Discharge Time: Greater than 30 minutes  Signed: Assunta Found, FNP-BC 12/30/2014, 12:45 PM  I personally assessed the patient and formulated the plan Madie Reno A. Dub Mikes, M.D.

## 2014-12-30 NOTE — BHH Group Notes (Signed)
[facility]   12/30/2014 9:50 AM  Participation Quality:  Appropriate   Mood/Affect:  Appropriate  Depression Rating:  0  Anxiety Rating:  0  Thoughts of Suicide:  No Will you contract for safety?   NA  Current AVH:  No  Plan for Discharge/Comments:  Pt reports that she is ready to d/c today and will live with a friend and follow up with her PCP. Pt [facility] but stated that she would work on this herself. Pt plans to attend daily AA and has sponsor.   Transportation Means: brother coming after lunch.   Supports: family and friends support.   Smart, American FinancialHeather LCSWA

## 2015-01-03 NOTE — Progress Notes (Signed)
Patient Discharge Instructions:  After Visit Summary (AVS):   Faxed to:  01/03/15 Discharge Summary Note:   Faxed to:  01/03/15 Psychiatric Admission Assessment Note:   Faxed to:  01/03/15 Suicide Risk Assessment - Discharge Assessment:   Faxed to:  01/03/15 Faxed/Sent to the Next Level Care provider:  01/03/15 [facility] to [facility] @ [phone]  Jerelene ReddenSheena E Micro, 01/03/2015, 3:52 PM

## 2015-01-09 ENCOUNTER — Emergency Department: Payer: Self-pay | Admitting: Emergency Medicine

## 2015-01-15 ENCOUNTER — Inpatient Hospital Stay: Payer: Self-pay | Admitting: Psychiatry

## 2015-03-04 NOTE — Consult Note (Signed)
PATIENT NAME:  Patty Cruz, Patty Cruz MR#:  [id number] DATE OF BIRTH:  [birth year]  DATE OF CONSULTATION:  06/27/2014  CONSULTING PHYSICIAN:  Kimyatta Lecy S. Garnetta Buddy, MD  REQUESTING PHYSICIAN: Daryel November, MD     REASON FOR CONSULTATION: "I have a problem. I need to go home."   HISTORY OF PRESENT ILLNESS:  The patient is a 59 year old female with a long history of alcohol dependence who presented to the ER seeking help with alcohol. She reported that she has been drinking consistently, but currently she has been drinking a little bit too much. She consumes "cheap wine", which is more-heavier during the weekends. 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  She reported that she has been drinking more currently as she likes drinking, and likes the taste and the feeling of the beer.  The patient reported that she went to [facility] approximately 5 years ago and remained sober for 2-1/2 years. However, she relapsed after that. She was recently admitted to the inpatient unit in January, but she was unable to maintain any sobriety. The patient reported that she feels depressed and also the holidays are stressful. She does not have any suicidal ideations or plans. She also reported history of seizure disorder approximately 6 years ago after she was drinking heavily and reported that she did not have any seizures after that. However, she still has tremors and shakes when she stops drinking. She reported that her fianc passed away 2 years ago which is stressful for her and she feels hopeless, helpless, depressed, anxious due to the same. She is unable to maintain any sobriety at this time and was minimizing and wanted to go home. She was asking if she can do the detox at home. Her blood alcohol level at the time of presentation to the ER was 501. The patient has poor insight into her drinking habits.   PAST PSYCHIATRIC HISTORY: The patient has long  history of alcohol dependence, was on the unit in January. She also has been here in the past as well.  She has history of seizures approximately 6 years ago.  She becomes shaky and has a history of tremors.  She was admitted to [facility] 5 years ago. She was enrolled in the AA in the past.  She has been treated for depression and used to be on Effexor but is not taking any medications at this time.   FAMILY HISTORY: The patient has history of alcohol dependence in her brother.   SOCIAL HISTORY: The patient currently lives by herself. She has three children, ages 24, 42 and 83. A history of DWI x 2 in the past. No current pending charges. She is currently employed as a Pharmacologist at [employer]. Her fianc passed away 2 years ago due to atrial fibrillation.   PAST MEDICAL HISTORY: The patient currently denied having any medical issues.   ALLERGIES: TRAMADOL AND CODEINE.   CURRENT MEDICATIONS: None.  REVIEW OF SYSTEMS:  CONSTITUTIONAL: Denies any fever or chills. No weight changes.  EYES: No double or blurred vision.  RESPIRATORY: No shortness of breath or cough.  CARDIOVASCULAR: No chest pain or orthopnea.  GASTROINTESTINAL: No abdominal pain, nausea, vomiting or diarrhea.  GENITOURINARY: No incontinence or frequency.  ENDOCRINE: No heat or cold intolerance.  LYMPHATIC: easy bruising.  MUSCULOSKELETAL: No muscle or joint pain.  NEUROLOGIC: No tingling or weakness.   MENTAL STATUS EXAMINATION: The patient is  a thinly built female who was slightly disheveled and appeared her stated age. She maintained fair eye contact.  She was calm and cooperative. Her gait and station appears within normal limits. Speech was low in tone and volume. Thought process was logical, no loose associations are noted. Thought content was non-delusional. She denied having any suicidal or homicidal ideation or plans. Her insight and judgment regarding her use of alcohol was poor.  She was awake,  alert and oriented x 3. Recent and remote memory were intact. Attention span and concentration were normal. Mood was depressed and affect was congruent. Language and fund of knowledge seems appropriate.   VITAL SIGNS: Temperature 98.2, pulse 102, respirations 18, blood pressure 117/92.  LABORATORY DATA:   Glucose 107, BUN 6, creatinine 0.65, sodium 140, potassium 3.9, chloride 102, bicarbonate 26, anion gap 12, osmolality 277, calcium 8.3, magnesium 2.3.  Blood alcohol at the time of presentation 501, but currently is 84. Protein 8.6, albumin 4.1, bilirubin 0.2, alkaline phosphatase 78, AST 31, ALT 22. TSH 1.59.  UDS negative. WBC 5.8, RBC 4.4, hemoglobin 15.5, hematocrit 45.7, MCV 104.   DIAGNOSTIC IMPRESSION:  AXIS I:  1.  Major depressive disorder.  2.  Alcohol dependence.  3.  Alcohol withdrawal.  AXIS II: None.  AXIS III: Alcohol withdrawal.   TREATMENT PLAN:  1.  The patient will be admitted to the inpatient behavioral health unit for stabilization and safety. She is currently under involuntary commitment.  2.   I will start her on Librium 25 mg p.o. every 6 hours for the next 4 days.  She is also on CIWA protocol.  She will be monitored closely by the staff and treatment team and they will adjust her medications.   I discussed with the patient about the treatment plan and she agreed with the plan.   Thank you for allowing me to participate in the care of this patient.     ____________________________ Ardeen FillersUzma S. Garnetta BuddyFaheem, MD  usf:DT D: 06/27/2014 12:39:44 ET T: 06/27/2014 13:36:57 ET JOB#: [id number]  cc: Ardeen FillersUzma S. Garnetta BuddyFaheem, MD, <Dictator> Rhunette CroftUZMA S Kurt Azimi MD ELECTRONICALLY SIGNED 07/07/2014 15:47

## 2015-03-04 NOTE — Consult Note (Signed)
PATIENT NAME:  Patty Cruz, Patty Cruz MR#:  [id number] DATE OF BIRTH:  1955/12/12  DATE OF CONSULTATION:  09/27/2014  REFERRING PHYSICIAN:   CONSULTING PHYSICIAN:  Audery Amel, MD  IDENTIFYING INFORMATION AND REASON FOR CONSULTATION: A 59 year old woman with a history of alcohol dependence who presented to the Emergency Room intoxicated with a chief complaint of not being able to eat for several days.   HISTORY OF PRESENT ILLNESS: Information obtained from the patient and the chart. The patient states that her reason for coming to the hospital is that she has not had anything to eat for several days. More specifically, she said that she had part of a smoothy on a couple of days, but otherwise has not been able to keep any food down. She has had a little bit of vomiting but just feels sick anytime she tries to eat anything. She is not having acute abdominal pain. She has been drinking. She is hesitant to ever give me a clear cut amount that she has been drinking, but clearly the last day has been even worse than probably it had been previously. She estimates that she has been drinking steadily for about 10 days. She cannot think of any specific reason why she relapsed, but she does talk about how her brother died of alcohol-related illness the weekend before last, which sounds like it would probably match up with when she started drinking again. She has not been using any other abusable drugs. She said that all last week she had been able to still go to work despite drinking, but now the last couple of days it sounds like it has gotten worse. She skipped work today because of her alcohol abuse. The patient admits that her mood is depressed and anxious. She denies that she has any current wish to die or to kill herself. Said that when she was intoxicated she was praying that God would let her die, but she is very clear that she would not do anything to try and kill herself. She denies that she is having any  hallucinations or any psychotic symptoms. She says she has still been going to [employer] regularly over the last week, although she has hidden it from her sponsor that she had relapsed into drinking. She is not currently taking any medication for depression. She had been prescribed disulfiram last time she was in the hospital, but never took it.    PAST PSYCHIATRIC HISTORY: The patient has a long history of alcohol abuse and has had detoxification on more than one occasion in the past. She was just here in our hospital for detoxification in August. She said that she was able to stay sober for about 4 weeks at that time. She does not have a history that she knows of, of having a seizure or having delirium tremens in the past. She has been prescribed Effexor by her primary care doctor in the past, but never really stayed compliant with it. Has not really ever been on any medication. No history of suicide attempts.   PAST MEDICAL HISTORY: The patient clearly has weight loss and GI symptoms related to her alcohol use but does not know of any specific diagnoses of it. Does not have any other known ongoing medical problems that she can identify.   SOCIAL HISTORY: She lives by herself. Not dating anyone. Her brother died of alcohol-related complications just a week ago. The patient has 3 adult children that she stays in contact with and thinks  that at least one of them will let her stay with them. 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   FAMILY HISTORY: More than one relative with serious alcohol problems including obviously a brother who died of alcohol-related illness.   CURRENT MEDICATIONS: None.   ALLERGIES: CODEINE AND TRAMADOL.   REVIEW OF SYSTEMS: Says that she is not having any abdominal pain. She was able to eat and drink a little bit of food since being here in the Emergency Room. Not feeling shaky or tremulous. Not having any visual hallucinations. Denies  suicidal or homicidal ideation. Still feeling a little bit tired and down, but not severely depressed.   MENTAL STATUS EXAMINATION: A somewhat disheveled, chronically ill-appearing woman, looks her stated age, cooperative with the interview. Eye contact intermittent. Psychomotor activity normal. Speech is quiet, but normal in amount. Affect is mildly dysphoric but not tearful and appropriately reactive. Mood stated as being depressed. Thoughts are lucid without any sign of delusional thinking. Denies auditory or visual hallucinations. Denies suicidal or homicidal ideation. She shows fairly reasonable judgment and insight right now all things considered. She can recall 3/3 objects immediately and 2/3 at 3 minutes. Alert and oriented x 4. Normal intelligence.   LABORATORY RESULTS: Salicylates 3.3. Acetaminophen negative. At 9:30 this morning she had a blood alcohol level of 369. On her chemistry panel, she just has a slightly elevated glucose at 109. Her MCV is slightly elevated at 102. Her urinalysis is normal. Her drug screen is negative.   VITAL SIGNS: Blood pressure 122/78, respirations 18, pulse 102, temperature 97.9.   ASSESSMENT: A 59 year old woman with alcohol abuse, severe. She came into the hospital intoxicated and has been here for several hours. She is not showing any obvious outward signs of acute alcohol withdrawal. Does not have a history of seizures or DTs. No sign of delirium. She has been depressed recently, but currently denies suicidal ideation and is able to list several good reasons why she would not want to kill herself. She states she is optimistic about the future and wanting to stop drinking. I have offered her my advice that we admit her to the hospital for detoxification, which I thought would increase her chances of staying sober. She is declining at this time and wants to go home. She does not meet commitment criteria. She states that she will go to stay with one of her daughters  and attend AA meetings regularly.   TREATMENT PLAN: I have advised her that if she begins shaking badly, having hallucinations, continuing to not be able to eat or drink, or in any way is feeling sick, badly or changes her mind about hospitalization, she should come back to the Emergency Room and we will be glad to re-evaluate and admit her to the hospital if needed. She is otherwise to get rid of all of her alcohol, stay with family, go to her AA meetings, and tell her sponsor what is going on. She agrees to all of this.   DIAGNOSIS, PRINCIPAL AND PRIMARY:  AXIS I: Alcohol abuse, severe.   SECONDARY DIAGNOSES: AXIS I: Adjustment disorder with depressed mood.  AXIS II: Deferred.  AXIS III: Abdominal pain from acute drinking.    ____________________________ Audery AmelJohn T. Clapacs, MD jtc:at D: 09/27/2014 15:42:02 ET T: 09/27/2014 16:06:35 ET JOB#: [id number]  cc: Audery AmelJohn T. Clapacs, MD, <Dictator> Audery AmelJOHN T CLAPACS MD ELECTRONICALLY SIGNED 10/21/2014 19:06

## 2015-03-04 NOTE — Discharge Summary (Signed)
PATIENT NAME:  Patty Cruz, Patty Cruz DATE OF BIRTH:  04-Nov-1956  DATE OF ADMISSION:  06/27/2014 DATE OF DISCHARGE:  06/30/2014  REASON FOR ADMISSION: Alcohol intoxication, needing detoxification.   DISCHARGE DIAGNOSES: AXIS I: Alcohol use disorder, severe,  alcohol withdrawal (resolved), alcohol-induced depressive disorder.  AXIS II: Deferred.  AXIS III: None.  AXIS IV: Death of fiance 5 years ago.  AXIS V: Global of functioning of 45.  DISCHARGE MEDICATIONS: Antabuse 250 mg p.o. daily.   HOSPITAL COURSE: A 59 year old Caucasian female with a long history of alcohol dependence, who presented to the Emergency Department seeking alcohol detoxification. 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 The patient has had prior admissions to our facility for similar reasons. The patient was admitted to [facility] as she was drinking large amounts of alcohol, and her alcohol level at admission was 501. She was started on alcohol withdrawal protocol. Vital signs were checked every 8 hours and CIWA scale was checked every 8 hours as well. With the  dose of Librium the patient was getting, there was no evidence of significant alcohol withdrawal. Throughout her hospitalization the vital signs were stable and CIWA score was below 3. Throughout the hospitalization, the patient showed limited insight into the severity of her alcoholism. The patient stated that she basically just wanted to be discharged back home because she knew she was going to stop drinking for once and all. The treating team proposed to do a family meeting in order to encourage the patient to attend treatment. Social worker contacted the patient's daughters, but they declined from attending. The patient and daughters both agreed with continuing treatment in intensive outpatient substance abuse facility and not inpatient rehabilitation at this time. The patient was planning on moving  in with a friend in order to decrease the level of isolation, which she felt was one of the triggers for drinking. The patient was agreeable to take a prescription for disulfiram Antabuse 250 mg p.o. daily. She was educated about the mechanism of action of this agent and how it can interact with other sources of alcohol such as hair products, colognes, foods. At the time of the discharge, the patient was stable, not showing any signs of withdrawal. Mood was improved. The patient denied problems with sleep, appetite, energy, or concentration. She denied any auditory, visual hallucinations.   MENTAL STATUS EXAMINATION AT THE TIME OF DISCHARGE: The patient was alert and oriented to person, place, time, and situation. Psychomotor activity was within normal range. Eye contact was within normal range. Speech had regular tone, volume, and rate. Thought process was linear and goal-directed. Thought content was negative for suicidality or homicidality. Perception was negative for psychosis. Mood euthymic. Affect reactive. Insight and judgment limited. Prior to mental status examination at the time of the discharge, CIWA score on the day of discharge was 0, vital signs within normal limits. This hospitalization uneventful. The patient did not require any seclusion or restraints, and there were no medical complications from alcohol withdrawal.   LABORATORY RESULTS: BUN 6, creatinine 0.65, sodium 140, potassium 3.9, GFR greater than 60. Alcohol level at admission was 501. AST 31, ALT 22. TSH 1.59. Urine toxicology was negative. WBC 5.8, hemoglobin 15.5, hematocrit 45.7, platelet count 244,000. Urinalysis was clear.   DISCHARGE DISPOSITION: The patient will be discharged to one of her friend's house.   DISCHARGE FOLLOWUP: The patient has been scheduled to follow up with the [facility] for intensive  outpatient substance abuse.    ____________________________ Jimmy Footman, MD ahg:at D: 06/30/2014  12:21:47 ET T: 06/30/2014 16:30:52 ET JOB#: [id number]  cc: Jimmy Footman, MD, <Dictator> Horton Chin MD ELECTRONICALLY SIGNED 07/01/2014 11:58

## 2015-03-04 NOTE — H&P (Signed)
PATIENT NAME:  Patty Cruz, GLASSCOCK MR#:  [id number] DATE OF BIRTH:  [birth year]  DATE OF ADMISSION:  11/12/2013  IDENTIFYING INFORMATION AND CHIEF COMPLAINT: A 59 year old woman with a history of alcohol abuse who presents voluntarily to the hospital for alcohol withdrawal. Consultation for possible admission. The patient's chief complaint: "I need detox."   HISTORY OF PRESENT ILLNESS: Information obtained from the patient and the chart and her daughter, who is accompanying her. The patient says that she relapsed into drinking on Christmas Eve of this year and for the last week has been drinking heavily. She does not even hazard a guess as to how much she has been drinking but says it has been a lot. She has not been able to eat any solid food. She has been feeling sick to her stomach and throwing up for several days. She denies that she is abusing any other drugs. She says that her mood had not been particularly depressed and does not report any real depressive symptoms in the time leading up to her relapse, but she does think the fact that this is the anniversary of the death of her boyfriend 2 years ago may have something to do with her relapse. Also, she just generally found the holidays to be stressful. Does not report suicidal ideation or homicidal ideation. Does not report psychotic symptoms.   PAST PSYCHIATRIC HISTORY: The patient has a history of alcohol dependence and was here on our unit a little over 3 years ago for detox. She says she has also been to [facility] in the past for detox. She has no history of seizures and no history of delirium tremens, although she does get very shaky and very sick to her stomach when she is coming off alcohol. She says she was involved with AA regularly for a couple of years, which was helpful for her, but the last 2 years has not been active. She used to be treated for depression and be on Effexor, but is not currently taking any medicine for it.   FAMILY HISTORY:  Has a brother with alcohol dependence.   SOCIAL HISTORY: The patient lives by herself. She has adult children with whom she is in regular contact and with whom she has a good relationship. 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 She says that her boyfriend died 2 years ago at just about this time, which is a stress for her.   PAST MEDICAL HISTORY: No significant ongoing medical problems.   CURRENT MEDICATIONS: None.   ALLERGIES: TRAMADOL AND CODEINE.   REVIEW OF SYSTEMS: Sick to her stomach, still feeling nauseous, had some vomiting today. Feels very weak. Some difficulty standing. Feels agitated and shaky all over. Has not been eating well for several days. Denies feeling suicidal or homicidal. Denies any hallucinations. Mood is just anxious and worried about detox. Otherwise negative.   MENTAL STATUS EXAMINATION: Slightly disheveled woman who looks her stated age or younger, interviewed in the Emergency Room. She was cooperative and pleasant during the interview. Made good eye contact. Psychomotor activity restrained but had a gross tremor every time she moved her hands. Speech was quiet but easy to understand, decreased slightly in total amount. Affect looked anxious and withdrawn a bit. Mood was stated as being nervous. Thoughts were lucid. No sign of loosening of associations or delusions. Denies auditory or visual hallucinations. Denies suicidal or homicidal ideation. Shows good insight and judgment. Normal intelligence. Alert and oriented x 4.  PHYSICAL EXAMINATION: GENERAL: The patient appears to be shaky all over, looks like she is in some distress. She appears to be quite dehydrated. Oral mucosae are dry. She has a small amount of blood in a cut on the left occipital area of her scalp that she points out to me from a fall she suffered the other day, but it is not actively bleeding. No sign of infection.  NEUROLOGIC: Pupils are equal and reactive. Face symmetric. She  has full range of motion at all extremities. Gross intentional and rest tremor, especially in the upper extremities. Cranial nerves symmetric and normal. Strength and reflexes normal and symmetric throughout. Did not ask her to stand or walk because of her recent dizziness.  LUNGS: Clear without wheezing. No extra sounds.  HEART: Regular rate and rhythm.  ABDOMEN: Soft, nontender, normal bowel sounds.  VITAL SIGNS: Temperature 98.3, pulse 105, respirations 20, blood pressure 129/79.   LABORATORY RESULTS: Chemistry panel showed elevated glucose at 112, creatinine low at 0.51, sodium low at 134. Alcohol level was 203 on admission. Drug screen was negative. Hematology panel: Normal platelet count, normal hemoglobin and hematocrit. Slightly elevated MCV. Urinalysis: No sign of infection. Salicylates and acetaminophen negative.   ASSESSMENT: A 59 year old woman with alcohol dependence, recent relapse, has been drinking heavily, having multiple physical symptoms. Needs detox for safety.   TREATMENT PLAN: Admit to psychiatry. I am going to give her some standing Librium for the first day because she is so shaky, as well as the regular detox protocol including the thiamine to be given as soon as possible. The patient will be monitored for any sign of seizures or falls. She will be engaged when possible in substance abuse and appropriate therapeutic groups on the ward. Work on discharge planning once she is stable. No indication for acute antidepressants.   DIAGNOSIS, PRINCIPAL AND PRIMARY:  AXIS I: Alcohol dependence.   SECONDARY DIAGNOSES: AXIS I: No diagnosis.  AXIS II: No diagnosis.  AXIS III: Alcohol withdrawal.  AXIS IV: Moderate to severe from relapse and anniversary of a death.  AXIS V: Functioning at time of evaluation 39.    ____________________________ Audery AmelJohn T. Clapacs, MD jtc:jcm D: 11/12/2013 14:18:21 ET T: 11/12/2013 14:37:34 ET JOB#: [id number]  cc: Audery AmelJohn T. Clapacs, MD,  <Dictator> Audery AmelJOHN T CLAPACS MD ELECTRONICALLY SIGNED 11/17/2013 23:32

## 2015-03-04 NOTE — Discharge Summary (Signed)
PATIENT NAME:  Patty Cruz, Patty Cruz MR#:  [id number] DATE OF BIRTH:  02/01/1956  DATE OF ADMISSION:  11/12/2013 DATE OF DISCHARGE:  11/15/2013  HOSPITAL COURSE: See dictated history and physical for details of admission. This 59 year old woman came in through the Emergency Room intoxicated requesting detox from alcohol. She had relapsed into drinking recently partially in relation to a painful anniversary. The patient was motivated to stop drinking. She was supported by her family. She was treated in the hospital with detox protocol medication as well as supportive and educational therapy. She did not have a seizure and has not shown delirium. At this point, her vital signs have become stable, except for still some intermittent elevated pulse. She is mentally intact. Denies suicidal ideation. Mood is feeling better. She is requesting discharge. There is no sign of acute dangerousness. The patient is educated about Alcoholics Anonymous and plans to get involved in that regularly as her primary treatment. Additionally, she plans to go to a local church-based program. She will be given information and a referral to the local provider of mental health services, probably [facility]. At this point, the patient is safe and agreeable to the discharge plan.   MEDICATIONS AT DISCHARGE: None.  LABORATORY RESULTS: Admission labs included a chemistry panel with glucose elevated at 112, creatinine low at 0.15, sodium low at 134. Alcohol level 203 on admission. Drug screen negative. CBC showed a slightly elevated MCV at 104, normal platelet count and not acutely anemic. Urinalysis: No sign of infection. The acetaminophen and salicylates negative.   MENTAL STATUS EXAM AT DISCHARGE: Neatly dressed and groomed woman, looks her stated age, cooperative with the interview. Good eye contact, normal psychomotor activity. Speech normal rate, tone, and volume. Affect still slightly blunted, but not as much as it was on admission, not acutely  dysphoric or sad. Mood stated as okay. Thoughts are lucid without loosening of associations or delusions. Denies auditory or visual hallucinations. Denies suicidal or homicidal ideation. Shows good insight and judgment. Normal intelligence. Alert and oriented x4.   DISPOSITION: Discharge home. Family may stay with her and she has follow-up treatment in place.   DIAGNOSIS, PRINCIPAL AND PRIMARY:  AXIS I: Alcohol dependence.   SECONDARY DIAGNOSES: AXIS I: Mood disorder, not otherwise specified.  AXIS II: Deferred.  AXIS III: No diagnosis.  AXIS IV: Severe from anniversary of the death of her boyfriend and recent relapse.  AXIS V: Functioning at time of discharge 59. ____________________________ Audery AmelJohn T. Clapacs, MD jtc:sb D: 11/15/2013 11:55:48 ET T: 11/15/2013 12:37:58 ET JOB#: [id number]  cc: Audery AmelJohn T. Clapacs, MD, <Dictator> Audery AmelJOHN T CLAPACS MD ELECTRONICALLY SIGNED 11/17/2013 23:32

## 2015-03-04 NOTE — Consult Note (Signed)
Brief Consult Note: Diagnosis: alcohol dependence.   Patient was seen by consultant.   Recommend further assessment or treatment.   Orders entered.   Discussed with Attending MD.   Comments: Psychiatry: Patient seen. Chart reviewed. ADmit for alcohol detox. Orders done. Patient agrees to the plan.  Electronic Signatures: Audery Amellapacs, Simcha Farrington T (MD)  (Signed 02-Jan-15 14:05)  Authored: Brief Consult Note   Last Updated: 02-Jan-15 14:05 by Audery Amellapacs, Tanelle Lanzo T (MD)

## 2015-03-12 NOTE — Consult Note (Signed)
PATIENT NAME:  Patty Cruz, Patty S MR#:  [id number] DATE OF BIRTH:  07/27/1956  DATE OF CONSULTATION:  01/10/2015  CONSULTING PHYSICIAN:  Audery AmelJohn T. Clapacs, MD  IDENTIFYING INFORMATION AND REASON FOR CONSULTATION: A 59 year old woman with a history of alcohol abuse who presented to the Emergency Room intoxicated.   CHIEF COMPLAINT: "I relapsed."   HISTORY OF PRESENT ILLNESS: The information obtained from the patient and the chart. The patient was here in the Emergency Room in mid-January and at that point [facility]. She said she stayed at [facility] for a couple of weeks, but has been out of there for 2-3 weeks. She relapsed into drinking pretty much immediately after getting out of [facility]. She has been drinking intermittently since then having a couple of days on and a couple of days off. Currently she has been drinking heavily since Saturday, cannot even begin to think what the total amount has been. Her mood has been frustrated and down. Appetite has been poor and she has been eating very little. Sleep chaotic. Denies suicidal ideation. Denies homicidal ideation. Not having any hallucinations. The patient has been going to AA meetings intermittently who at least she claims she has been but has not been in contact with her sponsor. Family evidently brought her in after she had blacked out last night. She does not remember the specific circumstances coming into the ER.   PAST PSYCHIATRIC HISTORY: Patty Cruz has a history of alcohol abuse that has been going on for several years. She has had extended periods of time being sober in years gone by but it sounds like things have gotten worse recently. Most recently, her father died in December of last year and since then things have been even more out of control for her. She does not have a history of seizures or delirium tremens coming off alcohol. Although her mood can be down and dysphoric at times it is not clear that she had a major  depression separate from her alcohol use. No history of suicide attempts or violence.   FAMILY HISTORY: Several people in the family have had alcohol abuse problems.   SOCIAL HISTORY: The patient is living at the moment by herself. Her closest relatives are her mother who is elderly and her 2 daughters who are to some degree fed up with her. She has a brother who takes some concern for her, but also has been fed up with her at times. 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 Currently unemployed and she also thinks her insurance has probably expired. She also had a brother who died last November from alcohol abuse.   PAST MEDICAL HISTORY: Chronically underweight because of poor nutrition. No other ongoing acute medical problems.   CURRENT MEDICATIONS: None.   ALLERGIES: CODEINE AND TRAMADOL.   REVIEW OF SYSTEMS: Mildly depressed, but no suicidal ideation. No hallucinations, no psychotic symptoms. A little bit nauseous this morning but has not been throwing up. No acute pain, no other physical complaints.   MENTAL STATUS EXAMINATION: A somewhat disheveled woman, looks her stated age or older, looks chronically ill. Cooperative with the interview. Eye contact intermittent. Psychomotor activity sluggish. Speech quiet and decreased in amount. Affect dysphoric but reactive. Mood stated as being bad. Thoughts are lucid. No evidence of loosening of associations or delusions. Denies auditory or visual hallucinations. Denies suicidal or homicidal ideation. The patient can repeat 3  words immediately, remembers 1 of them at 3 minutes. Judgment and insight adequate at the moment. Intelligence normal. Alert and oriented x4.   LABORATORY RESULTS: When she presented last night initially she had a blood alcohol level of 422. Drug screen all negative. Chemistry panel: Low sodium 135, glucose elevated at 110. The rest of it is all normal.  CBC, slightly high white count at, but otherwise all normal indices including normal platelet count. Urinalysis, 1+ leukocyte esterase, has 14 white cells, probably has an infection.   VITAL SIGNS: Blood pressure 122/79, respirations 18, pulse 88, temperature 98.1.   ASSESSMENT: A 59 year old woman with alcohol abuse. Currently sober. Not having major symptoms of alcohol withdrawal and has no history of complex alcohol withdrawal. No suicidality. The patient does not require inpatient psychiatric treatment. She is getting more and more towards the bottom of the barrel socially with her alcohol abuse.   TREATMENT PLAN: The patient and I discussed various options, which she is aware of. She was just in [facility] and now her insurance may be running out. She does not want to go to the alcohol and drug abuse treatment center. We discussed other low-cost or free substance abuse programs which she is not particularly motivated for. We discussed medication that could be used for alcohol abuse. We even discussed the pro Trosa. At this point, the patient is aware that she really needs to reinvest herself seriously in alcohol abuse treatment and ought to be in some kind of environment where she is not isolating. She is encouraged to contact her family to see if she can stay with them. We will definitely be referring her to [facility] and see if we can get their community support to be aware of her. No further medication changes.   DIAGNOSIS, PRINCIPAL AND PRIMARY:  AXIS I: Alcohol abuse, severe.   SECONDARY DIAGNOSES:  1.  AXIS I: No further.  2.  AXIS II: No diagnosis.  3.  AXIS III: Underweight, poor nutrition.   ____________________________ Audery Amel, MD jtc:mc D: 01/10/2015 11:34:37 ET T: 01/10/2015 12:23:33 ET JOB#: [id number]  cc: Audery Amel, MD, <Dictator> Audery Amel MD ELECTRONICALLY SIGNED 01/17/2015 10:38

## 2015-03-12 NOTE — H&P (Signed)
PATIENT NAME:  Patty Cruz, Patty Cruz MR#:  [id number] DATE OF BIRTH:  June 06, 1956  DATE OF ADMISSION:  01/15/2015  IDENTIFYING DATA: The patient is a 59 year old, divorced female who was IVCd secondary to alcohol intoxication.   CHIEF COMPLAINT: "Severe alcoholism."   HISTORY OF PRESENT ILLNESS: The patient states that she has been consuming about one half of a 1.5 liter wine bottle daily. She states she has had problems with alcohol for 20 years. She states her longest period of sobriety was 2-1/2 years in 2010 after attending rehab at [facility]. She states that after she left [facility] in 2010, she then went to an early recovery group that involved attending a program 1 night a week for 12 weeks. Most recently, she was at [facility] again and discharge on 12/22/2014. She indicates she immediately started resuming her drinking. It appears, based and review of the collateral paperwork, that some relative /friend in the community contacted the police and subsequently facilitated the IVC to the emergency room.   In regards to symptoms of depression, the patient really denies being down and depressed. She states she is usually able to pick herself up. She states her sleep has been "sporadic." She states that she actually has been able to have an interest in things and do various things. She states that she exercises regularly and was last exercising 1 week ago. She states that her energy is pretty good. She states that her appetite is normally very good but when she consumes alcohol, it is poor. She denies any suicidal ideation or any past suicide attempts. She does indicate that she has had panic attacks, which consists of her having palpitations, feeling weak in the knees, difficulty walking, sweating and anxiety. States they will last several minutes.   She states that she was prescribed Effexor for 2 weeks while she was at this most recent [facility], but that she stopped taking it. She said  she also had Effexor in 2010, but found that it did not help either.   PAST PSYCHIATRIC HISTORY: As discussed above, [facility] admissions and a previous admission to this inpatient unit in the summer of 2015 and she relates that admission was also related to alcohol. She denies any illicit drug use. She denies any cigarette use. She denies any past suicide attempts.   PAST MEDICAL HISTORY: She states she has had surgery for carpal tunnel and C-sections. She denies any active medical problems. She denies any prior seizures related to alcohol withdrawal.   FAMILY HISTORY: The patient states that she has a brother that died of alcohol poisoning in September 17, 2014. She states otherwise there is no other family history of psychiatric or substance use issues.   SOCIAL HISTORY: The patient is a Engineer, maintenance (IT). She describes her childhood as good. She denies any abuse. She states that she worked initially for a [employer] in [place] and then she has most recently worked as a Pharmacologist for 11 years; however, it appears that she was terminated from that job related to absenteeism secondary to alcoholism.   MEDICATIONS: She states she is not taking any medications currently.   ALLERGIES: CODEINE, HYDROCODONE, TRAMADOL AND TRAZODONE.   REVIEW OF SYSTEMS: The patient denies any chest pain, denies any shortness of breath, denies any diarrhea or constipation. She denies any fevers, chills or night sweats. The rest of the 10-point review of systems negative.   MENTAL STATUS EXAMINATION: The patient is a middle-aged female appearing her stated  age of 59. She is dressed in hospital scrubs. She makes good eye contact. There is no abnormal movements. Her speech is normal rate and normal volume. Her thought process is linear and goal directed. Her thought content: There is no suicidal ideation, homicidal ideation, no auditory hallucinations, no visual hallucinations, no delusional thinking. Her  mood is okay. Her affect is depressed. Her insight and judgment are poor. Her remote memory is intact as evident by her ability to describe a detailed work history, locations, past medications and past treatment. Immediate memory is intact as evident by her ability to discuss some of being in the hospital, the duration of time that she has been in the ED.   PHYSICAL EXAMINATION:  T 98.7, Pulse 72bpm, RR 18 per minute, BP 143/81 mmHg GENERAL: She was a middle-aged female in no apparent distress. Her gait was normal.  MUSCULOSKELETAL: Exam was grossly and intact.   LABORATORY DATA: The patient's comprehensive metabolic panel was significant for a slightly low creatinine at 0.55. Calcium slightly low at 7.9. SGOT slightly elevated at 42. Her ethanol level on 01/13/2015 was 485. TSH was within normal limits. Her CBC was within normal limits. Tylenol level was less than 2. Salicylate level was 2.9.   ASSESSMENT: A 59 year old, divorced female who has alcohol use disorder, severe. It appears that she has been dealing with problems with alcohol for 20 years and the longest period of sobriety 2-1/2 years after [facility] at that time. Most recently, [facility] was [facility], but then relapsed immediately. It does appear she has not had any outpatient follow-up. She was involuntarily committed secondary to severe alcohol intoxication. She reports some depression related to her house being foreclosed, however, she endorses being able to enjoy things and be active despite her alcohol use. However, collateral information may be useful in gaining insight as to how well the patient is actually functioning given her use of alcohol.   PLAN: The patient will be placed on the CIWA protocol. She has been started on Celexa for depression. We will provide her melatonin for sleep, as she states she has used this in the past. The patient will likely need referral to a rehabilitation  program and perhaps that being followed by some type of intensive outpatient program.     ____________________________ Loralie ChampagneAlton L. Mayford KnifeWilliams, MD alw:TT D: 01/15/2015 13:48:28 ET T: 01/15/2015 14:20:53 ET JOB#: [id number]  cc: Leory PlowmanAlton L. Mayford KnifeWilliams, MD, <Dictator> Kerin SalenALTON L Camdin Hegner MD ELECTRONICALLY SIGNED 01/15/2015 15:15

## 2015-03-12 NOTE — Consult Note (Signed)
PATIENT NAME:  Patty Cruz, Patty S MR#:  [id number] DATE OF BIRTH:  16-Jun-1956  DATE OF CONSULTATION:  01/14/2015  [facility]   AGE:  58 years.  SEX:  Female.  RACE:  White.  SUBJECTIVE:  The patient was in consultation at [facility].  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  The patient is divorced after being married for many years and the patient lives by herself.  The patient reports that last night she was drinking alcohol and got drunk and she cannot remember who called 911, but her family knew that she was drinking and here she is for help.  CHIEF COMPLAINT:  Drinking too much. "I have been drinking at the rate of 2 large bottles of wine in a period of a few days."  HISTORY OF PRESENT ILLNESS:  The patient reports that she had been to [facility] for their program for the second time and was just discharged in February on 12/22/2014 and she did not stay sober at all and she started drinking alcohol immediately.  In addition, she has a sponsor, but does not call her, does not get help for the same.  She [facility] and she stayed sober for 2 years and then she started drinking again when she lost her fiance and she lost her house.  She admits that she has been feeling depressed because since her house was foreclosed and her marriage ended.  She has 3 children and she is close to 2 of her daughters.  PAST PSYCHIATRIC HISTORY: No previous history of inpatient psychiatry.  Not being followed by a psychiatrist.   MENTAL STATUS:  The patient was seen laying in bed, comfortable, alert and oriented, calm, pleasant, and cooperative.  No agitation.    The patient reports that she had alcohol drinking problem for many years, had 2 DWIs and got her driver's  license back.  denies any other street drugs, or prescription drug abuse.  Denies using IV drugs.  Denies using opiates.  Denies smoking nicotine cigarettes.  The patient was seen laying in bed calm and quiet and comfortable, alert and oriented, calm, pleasant, and cooperative.  No agitation.  Affect is appropriate with her mood, which is low and down and depressed about several stressors of her life, including losing her job, her house being foreclosed and losing her marriage.  No psychosis.  Does not appear to respond to stimuli.  She appears to be very depressed, though she contracts for safety and she wants to get help.  Insight and judgment are guarded.  Impulse control is poor.   her blood level of alcohol, BAL, was 425 at the time of help coming to [facility].  IMPRESSION:  Alcohol abuse, chronic, continuous, with intoxication, substance use and mood disorder, major depressive disorder.  Recommending inpatient hospital in psychiatry for close observation and evaluation.  She will be continued on CIWA protocol, will be started on Celexa 40 mg p.o. daily to help her with her depression and Trazodone 50 mg p.o. at bedtime to help her get her rest at night.  ____________________________ Jannet MantisSurya K. Guss [place], MD skc:ap D: 01/14/2015 15:30:45 ET T: 01/14/2015 16:52:23 ET JOB#: [id number]  cc: Monika SalkSurya K. Brandace Cargle, MD, <Dictator> Jerel Sardina K Yazaira Speas  MD ELECTRONICALLY SIGNED 01/15/2015 16:17

## 2015-03-12 NOTE — Consult Note (Signed)
PATIENT NAME:  Patty Cruz, Patty Cruz MR#:  [id number] DATE OF BIRTH:  Oct 01, 1956  DATE OF CONSULTATION:  11/22/2014  CONSULTING PHYSICIAN:  Audery Amel, MD  IDENTIFYING INFORMATION AND REASON FOR CONSULTATION:  A 59 year old woman with a history of alcohol abuse, who comes to the hospital with chief complaint, "I'm an alcoholic."   HISTORY OF PRESENT ILLNESS:  Information is obtained from the patient and the chart. The patient says that she had been sober for about a month up until Christmas, but the holidays were overwhelming for her and she relapsed. She has been drinking mostly on weekends, but now she has been continuing to drink for the last several days. She knows it has gotten out of control. She has not been able to eat for a few days. She has been sleeping poorly. She called her brother today and asked for help, and when he came, he called 911, particularly upset after he observed her drinking mouthwash. The patient says her mood has been bad. Denies any suicidal ideation. Denies any hallucinations. She is not currently taking any prescription medicines. Denies any other drug abuse.   PAST PSYCHIATRIC HISTORY:  Long history of alcohol abuse, multiple prior admissions for detoxification. She has had depressive symptoms and been treated with antidepressants, unclear how effective it has ever really been. She has been able to maintain sobriety intermittently with the help of rehabilitation and Alcoholics Anonymous. Denies any history of actually trying to kill herself. No history of violence.   PAST MEDICAL HISTORY:  Underweight, poorly nourished. Denies any other significant ongoing medical problems.   SOCIAL HISTORY:  She lives alone. She works as a Pensions consultant at [employer]. She has not been to work since last Friday. She has one brother who died of alcoholism just this past November. She still has at least a sibling or 2 who she stays in some contact with.   FAMILY HISTORY:  Positive for several  other people with alcohol abuse problems.   REVIEW OF SYSTEMS:  Denies suicidal ideation. Denies homicidal ideation. Denies hallucinations. Feeling sick to her stomach, but not throwing up currently. Feeling weak. Admits that she has had a couple of falls recently. A little bit shaky. No other specific physical symptoms.   MENTAL STATUS EXAMINATION:  Thin, disheveled, sick-appearing woman, cooperative with the interview. Good eye contact. Slow and sluggish psychomotor activity. Quiet speech, but easy to understand. Affect is somewhat rueful. Mood is stated as being fed up. Thoughts are lucid. No sign of loosening of associations or delusions. Denies auditory or visual hallucinations. Denies suicidal or homicidal ideation. She is alert and oriented x 4. She can remember 3/3 objects immediately and repeats 2/3 at 3 minutes. Judgment and insight are intact. Intelligence is normal.   VITAL SIGNS:  Emergency Room vitals include blood pressure 133/86, respirations 18, pulse 108, and temperature 98.   LABORATORY RESULTS:  Alcohol level on presentation this morning at about 10:30 was 399. TSH is normal. Chemistries:  Low creatinine at 0.54, low calcium at 8.1. Drug screen is all negative. CBC is unremarkable. Urinalysis is unremarkable.   ASSESSMENT:  A 59 year old woman with alcohol abuse, severe, and depressed mood, sick appearing, poor self-care, but not acutely suicidal or psychotic. The patient does not require admission to our hospital right here, but is clearly going to continue drinking herself to death without some assistance. She is requesting that we try to help her get into [facility].   TREATMENT PLAN:  The case was  discussed with the Emergency Room doctor. The patient can be given some detoxification medicine tonight. Encouraged to take food and drink by mouth. I will work with the hospital team to see if we can get her referred to [facility], [facility]  Supportive counseling. The patient agrees to the plan.   DIAGNOSIS, PRINCIPAL AND PRIMARY:  AXIS I:  Alcohol abuse, severe.   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   AXIS II:  Deferred.   AXIS III:  Weight loss and malnutrition secondary to alcohol abuse.     ____________________________ Audery AmelJohn T. Clapacs, MD jtc:nb D: 11/22/2014 22:16:30 ET T: 11/22/2014 22:31:59 ET JOB#: [id number]  cc: Audery AmelJohn T. Clapacs, MD, <Dictator> Audery AmelJOHN T CLAPACS MD ELECTRONICALLY SIGNED 12/21/2014 17:17

## 2015-03-12 NOTE — Consult Note (Signed)
Psychiatry: Follow-up for this 59 year old woman with alcohol abuse.  States that today she is feeling much better.  Has been able to eat and drink without difficulty.  Not having any dizziness or falls.  No sign of delirium.  Mood is mildly dysphoric but not hopeless.  Denies any suicidality.  No findings on review of systems. mental status she has cleaned herself up.  Very cooperative and pleasant.  Good eye contact and normal psychomotor activity no tremor evident.  Mood is stated as okay.  Thoughts are lucid no evidence loosening of associations.  Denies auditory or visual hallucinations denies suicidality.  Alert and oriented 4 judgment and insight intact short and long-term memory intact. has sobered up and is not having serious withdrawal symptoms.  Her insight is good.  She has a plan to go to [facility] at the earliest opportunity.  She requested discharge to go home and get herself ready.  Patient has been counseled about the high risk of returning to drinking.  She is aware of this.  States that she will call her sponsor and call her brother.  Family are going to look out for her and try to get her into treatment.  Patient feels confident she can avoid acute alcohol abuse.  No longer committable.  She can be released from the emergency room to follow-up in the community. alcohol abuse severe.  Electronic Signatures: Audery Amellapacs, John T (MD)  (Signed on 13-Jan-16 17:56)  Authored  Last Updated: 13-Jan-16 17:56 by Audery Amellapacs, John T (MD)

## 2015-03-21 NOTE — H&P (Signed)
PATIENT NAME:  Benjaman LobeSPOON, Roiza S [id number] OF BIRTH:  02/14/1956 59 y/o Caucasian female with a long history of alcohol dependence who presented to the ER seeking help with alcohol. 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  "I need to get back to work".  OF PRESENT ILLNESS:  She reported that she has been drinking consistently, but currently she has been drinking a little bit too much. She consumes "cheap wine", which is more-heavier during the weekends. She reported that she does not drink during the daytime as she works as a Associate Professorpharmacy tech at the [employer] and denies eye openers or blackouts. However per nursing report patient?s supervisor has a breathalyzer as pharmacy had concerns with patient drinking while at the job.   She reported that she has been drinking more lately. The patient reported that she feels depressed and also the holidays are stressful. She does not have any suicidal ideations or plans. She also reported history of seizure disorder approximately 6 years ago after she was drinking heavily and reported that she did not have any seizures after that. However, she still has tremors and shakes when she stops drinking. She reported that her fianc passed away 2 years ago which is stressful for her and she feels hopeless, helpless, depressed, anxious due to the same. She is unable to maintain any sobriety at this time and was minimizing and wanted to go home. She was asking if she can do the detox at home. Her blood alcohol level at the time of presentation to the ER was 501. The patient has poor insight into her drinking habits. CIWA this am was 7. ABUSE HISTORY: The patient reported that she went to [facility] years ago and remained sober for 2-1/2 years. However, she relapsed after that. She was recently admitted to the inpatient unit in January, but she was unable to maintain any sobriety. PSYCHIATRIC HISTORY: The patient has long history of alcohol dependence,  was on the unit in January. She also has been here in the past as well.  She has history of seizures approximately 6 years ago.  She becomes shaky and has a history of tremors.  She was [facility] to [facility]. She was enrolled in the AA in the past.  She has been treated for depression and used to be on Effexor several years ago but is not taking any medications at this time.  MEDICAL HISTORY: The patient currently denied having any medical issues.  HISTORY: The patient has history of alcohol dependence in her brother.  HISTORY: The patient currently lives by herself. She has three children, ages 4928, 1726 and 6024. A history of DWI x 2 in the past. No current pending charges. She is currently employed as a Pharmacologistpharmacy technician at [employer]. Her fianc passed away 2 years ago due to atrial fibrillation.    ALLERGIES: TRAMADOL AND CODEINE cause nausea MEDICATIONS: None. STATUS EXAMINATION: thin Caucasian female, appears stated age, good grooming and hygienepleasant and cooperative but minimizing use of alcoholCONTACT: good  eye contactregular tone, volume and rateACTIVITY: wnlPROCESS:linearCONTENT:denies SI, HI or A/VHconstricted OF SYSTEMS:no weight loss, fever, chills, weakness or fatigue.no visual loos, blurred vision, double vision or yellow sclerae.  Ears, nose and throat: no hearing loos, sneezing, congestion, runny nose or sore throat.no rash or itchingno chest pain, chest pressure or discomfort.  No palpitations or edema.no shortness of breath, cough or sputum.no anorexia, nausea, vomiting or diarrhea. No abdominal pain or blood.no burning on  urination.no muscle, back or joint pain/stiffness.no anemia, bleeding or bruising.no enlarged nodes. No h/o splenectomy.see history of present illness.no headache, dizziness, syncope, paralysis, ataxia, numbness or tingling in extremities.  No change in bowel or bladder control.no reports of sweating, cold or heat intolerance.  No polyuria or  poly- dipsia.no history of asthma, hives, eczema or rhinitis.  SIGNS: BP:130/84 ,  R:16 , P:99 , T: 99.3 PHYSICAL EXAMINATION: APPEARANCE: The patient is alert, oriented.  Patient is in no acute distress. Head is normocephalic.  Pupils are equal and reactive. Supple without lymphadenopathy. Regular rate and rhythm. normal breath sounds. No crackles or wheezes are heard. Soft, nontender, nondistended with good bowel sounds heard. Without cyanosis, clubbing or edema.  NEUROLOGICAL: Gross nonfocal. RESULTS104LEVEL: 501TOXICOLOGY:PREGNANCY: n/aclear  IMPRESSION: I: Alcohol use disorder severe, Alcohol withdrawal, Alcohol induced depressive disorderII: None. III: none IV: death of fianceV:GAF of 30 PLAN:  1. Continue with alcohol withdrawal protocol -CIWA q 8 hq 8hincreased to 50 mg tid2 mg po q 8h prn for CIWA >15       -Thiamine 100 mg po q day is not interested in inpt substance abuse treatment.  Pt request to e d/c as she needs to return to work.  May consider intensive outpt substance abuse/outpt commitment.    Electronic Signatures: Jimmy FootmanHernandez-Gonzalez, Appollonia Klee (MD)  (Signed on 18-Aug-15 14:51)  Authored  Last Updated: 18-Aug-15 14:51 by Jimmy FootmanHernandez-Gonzalez, Jalexa Pifer (MD)

## 2015-09-24 ENCOUNTER — Encounter: Payer: Self-pay | Admitting: Emergency Medicine

## 2015-09-24 ENCOUNTER — Emergency Department
Admission: EM | Admit: 2015-09-24 | Discharge: 2015-09-25 | Disposition: A | Discharge status: Other Acute Inpatient Hospital | Payer: PRIVATE HEALTH INSURANCE | Attending: Emergency Medicine | Admitting: Emergency Medicine

## 2015-09-24 DIAGNOSIS — Z79899 Other long term (current) drug therapy: Secondary | ICD-10-CM | POA: Insufficient documentation

## 2015-09-24 DIAGNOSIS — F1024 Alcohol dependence with alcohol-induced mood disorder: Secondary | ICD-10-CM

## 2015-09-24 DIAGNOSIS — F10920 Alcohol use, unspecified with intoxication, uncomplicated: Secondary | ICD-10-CM

## 2015-09-24 DIAGNOSIS — R45851 Suicidal ideations: Secondary | ICD-10-CM | POA: Insufficient documentation

## 2015-09-24 HISTORY — DX: Alcohol abuse, uncomplicated: F10.10

## 2015-09-24 LAB — COMPREHENSIVE METABOLIC PANEL WITH GFR
ALT: 37 U/L (ref 14–54)
AST: 54 U/L — ABNORMAL HIGH (ref 15–41)
Albumin: 5 g/dL (ref 3.5–5.0)
Alkaline Phosphatase: 91 U/L (ref 38–126)
Anion gap: 9 (ref 5–15)
BUN: 7 mg/dL (ref 6–20)
CO2: 26 mmol/L (ref 22–32)
Calcium: 9.2 mg/dL (ref 8.9–10.3)
Chloride: 103 mmol/L (ref 101–111)
Creatinine, Ser: 0.6 mg/dL (ref 0.44–1.00)
GFR calc Af Amer: >60 mL/min
GFR calc non Af Amer: >60 mL/min
Glucose, Bld: 107 mg/dL — ABNORMAL HIGH (ref 65–99)
Potassium: 4 mmol/L (ref 3.5–5.1)
Sodium: 138 mmol/L (ref 135–145)
Total Bilirubin: 0.6 mg/dL (ref 0.3–1.2)
Total Protein: 8.4 g/dL — ABNORMAL HIGH (ref 6.5–8.1)

## 2015-09-24 LAB — CBC
HEMATOCRIT: 46.5 % (ref 35.0–47.0)
HEMOGLOBIN: 15.4 g/dL (ref 12.0–16.0)
MCH: 33.8 pg (ref 26.0–34.0)
MCHC: 33 g/dL (ref 32.0–36.0)
MCV: 102.4 fL — AB (ref 80.0–100.0)
Platelets: 279 10*3/uL (ref 150–440)
RBC: 4.55 MIL/uL (ref 3.80–5.20)
RDW: 13.8 % (ref 11.5–14.5)
WBC: 4.7 10*3/uL (ref 3.6–11.0)

## 2015-09-24 LAB — URINE DRUG SCREEN, QUALITATIVE (ARMC ONLY)
Amphetamines, Ur Screen: NOT DETECTED
Barbiturates, Ur Screen: NOT DETECTED
Benzodiazepine, Ur Scrn: NOT DETECTED
Cannabinoid 50 Ng, Ur ~~LOC~~: NOT DETECTED
Cocaine Metabolite,Ur ~~LOC~~: NOT DETECTED
MDMA (Ecstasy)Ur Screen: NOT DETECTED
Methadone Scn, Ur: NOT DETECTED
Opiate, Ur Screen: NOT DETECTED
Phencyclidine (PCP) Ur S: NOT DETECTED
Tricyclic, Ur Screen: NOT DETECTED

## 2015-09-24 LAB — ETHANOL: Alcohol, Ethyl (B): 311 mg/dL (ref <5)

## 2015-09-24 MED ORDER — ONDANSETRON HCL 4 MG PO TABS
4.0000 mg | ORAL_TABLET | Freq: Once | ORAL | Status: AC
  Administered 2015-09-24: 4 mg via ORAL
  Filled 2015-09-24: qty 1

## 2015-09-24 MED ORDER — VITAMIN B-1 100 MG PO TABS
100.0000 mg | ORAL_TABLET | Freq: Every day | ORAL | Status: DC
  Administered 2015-09-24 – 2015-09-25 (×2): 100 mg via ORAL
  Filled 2015-09-24 (×2): qty 1

## 2015-09-24 MED ORDER — LORAZEPAM 2 MG/ML IJ SOLN: 0.0000 mg | Freq: Four times a day (QID) | INTRAMUSCULAR | Status: DC

## 2015-09-24 MED ORDER — LORAZEPAM 2 MG PO TABS: 0.0000 mg | ORAL_TABLET | Freq: Two times a day (BID) | ORAL | Status: DC

## 2015-09-24 MED ORDER — THIAMINE HCL 100 MG/ML IJ SOLN: 100.0000 mg | Freq: Every day | INTRAMUSCULAR | Status: DC

## 2015-09-24 MED ORDER — LORAZEPAM 2 MG/ML IJ SOLN: 0.0000 mg | Freq: Two times a day (BID) | INTRAMUSCULAR | Status: DC

## 2015-09-24 MED ORDER — ONDANSETRON 4 MG PO TBDP
4.0000 mg | ORAL_TABLET | Freq: Once | ORAL | Status: AC
  Administered 2015-09-24: 4 mg via ORAL
  Filled 2015-09-24: qty 1

## 2015-09-24 MED ORDER — LORAZEPAM 2 MG PO TABS: 0.0000 mg | ORAL_TABLET | Freq: Four times a day (QID) | ORAL | Status: DC

## 2015-09-24 NOTE — ED Provider Notes (Signed)
[facility] Emergency Department Provider Note  ____________________________________________  Time seen: 1510  I have reviewed the triage vital signs and the nursing notes.   HISTORY  Chief Complaint Alcohol Problem and Suicidal     HPI Patty Cruz is a 59 y.o. female who has a history of our call abuse who presents emergency department today due to binge drinking and the desire for help. She reports she was in rehabilitation last January and has help on and off through the year. She remained sober for approximately 4 months. She has been drinking lately, but this past Friday she increasing amounts she was drinking significantly. She is been drinking through the weekend and presents to the emergency department reporting that she knew she could not stop drinking without additional help. The patient's last ingestion was approximately an hour before arrival.  The patient is pleasant and alert. She denies suicidal thoughts. She denies chest pain, shortness of breath, or headache. She does report that she has intermittent abdominal pain on occasion and is concerned about her liver. She denies any nausea, vomiting, or diarrhea.    Past Medical History  Diagnosis Date  . Alcohol abuse     Patient Active Problem List   Diagnosis Date Noted  . Alcohol dependence with alcohol-induced mood disorder (HCC) 12/28/2014  . Alcohol dependence with uncomplicated withdrawal (HCC) 12/27/2014  . Recurrent major depression-severe (HCC) 12/27/2014    Past Surgical History  Procedure Laterality Date  . Cesarean section  1991  . Wrist surgery Right   . Appendectomy      Current Outpatient Rx  Name  Route  Sig  Dispense  Refill  . acamprosate (CAMPRAL) 333 MG tablet   Oral   Take 2 tablets (666 mg total) by mouth 3 (three) times daily with meals.   180 tablet   0   . venlafaxine XR (EFFEXOR-XR) 150 MG 24 hr capsule   Oral   Take 1 capsule (150 mg total) by mouth  daily with breakfast.   30 capsule   0     Allergies Codeine; Tramadol; and Trazodone and nefazodone  History reviewed. No pertinent family history.  Social History Social History  Substance Use Topics  . Smoking status: Never Smoker   . Smokeless tobacco: Never Used  . Alcohol Use: No    Review of Systems  Constitutional: Negative for fatigue. ENT: Negative for congestion. Cardiovascular: Negative for chest pain. Respiratory: Negative for cough. Gastrointestinal: Negative for abdominal pain, vomiting and diarrhea. Genitourinary: Negative for dysuria. Musculoskeletal: No myalgias or injuries. Skin: Negative for rash. Neurological: Negative for headache or focal weakness Psychiatric: Alcohol abuse. See history of present illness  10-point ROS otherwise negative.  ____________________________________________   PHYSICAL EXAM:  VITAL SIGNS: ED Triage Vitals  Enc Vitals Group     BP 09/24/15 1418 134/85 mmHg     Pulse Rate 09/24/15 1418 87     Resp 09/24/15 1418 15     Temp --      Temp src --      SpO2 09/24/15 1418 96 %     Weight --      Height --      Head Cir --      Peak Flow --      Pain Score --      Pain Loc --      Pain Edu? --      Excl. in GC? --     Constitutional: Alert and oriented. Well  appearing and in no distress. ENT   Head: Normocephalic and atraumatic. Cardiovascular: Normal rate, regular rhythm, no murmur noted Respiratory:  Normal respiratory effort, no tachypnea.    Breath sounds are clear and equal bilaterally.  Gastrointestinal: Soft and nontender. No distention.  Back: No muscle spasm, no tenderness, no CVA tenderness. Musculoskeletal: No deformity noted. Nontender with normal range of motion in all extremities.  No noted edema. Neurologic:  Communicative. Normal appearing spontaneous movement in all 4 extremities. No gross focal neurologic deficits are appreciated.  Skin:  Skin is warm, dry. No rash noted. Psychiatric:  Patient is lucid, communicative. She seems to have moderate insight. She denies suicidal thoughts.  ____________________________________________    LABS (pertinent positives/negatives)  Labs Reviewed  ETHANOL - Abnormal; Notable for the following:    Alcohol, Ethyl (B) 311 (*)    All other components within normal limits  COMPREHENSIVE METABOLIC PANEL - Abnormal; Notable for the following:    Glucose, Bld 107 (*)    Total Protein 8.4 (*)    AST 54 (*)    All other components within normal limits  CBC - Abnormal; Notable for the following:    MCV 102.4 (*)    All other components within normal limits  URINE DRUG SCREEN, QUALITATIVE ([facility] ONLY)   ____________________________________________   INITIAL IMPRESSION / ASSESSMENT AND PLAN / ED COURSE  Pertinent labs & imaging results that were available during my care of the patient were reviewed by me and considered in my medical decision making (see chart for details).  Pleasant, overall sober appearing, 59-year-old female with an alcohol level of 311. This patient has been drinking heavily through the weekend, yet still is lucid. I think this reflects the degree of alcohol problems she's had in the past. She is seeking help now because she feels she cannot stop drinking without intervention. She is tolerating fluids by mouth and is in no acute distress. Psych intake evaluation pending.  ----------------------------------------- 10:51 PM on 09/24/2015 -----------------------------------------  Patient has remained calm through the evening. She does appear to be somewhat manipulative at times. Overall this is with a very pleasant affect. Psychiatric consultation pending. ____________________________________________   FINAL CLINICAL IMPRESSION(S) / ED DIAGNOSES  Final diagnoses:  Alcohol dependence with alcohol-induced mood disorder (HCC)  Acute alcohol intoxication, uncomplicated (HCC)      Darien Ramusavid W Lelah Rennaker, MD 09/24/15 2251

## 2015-09-24 NOTE — BH Assessment (Signed)
Assessment Note  Patty Cruz is an 59 y.o. female. [name] reports that she came to the ED by way of an ambulance, after she contacted them when she "reached the point where she could not handle it anymore.  She reports that she knew she drank too much alcohol that if she tried to detox herself she would die.  She states that she is an alcoholic and needs help.  She reports that she sometimes feels depressed and the alcohol takes the depression away. She reports having anxiety in the past, but none currently.  She denied having auditory or visual hallucinations. She denied having suicidal or homicidal ideation.  She reports no trigger to drink, she states that "I just picked up that first drink and I could not put it down". She reports that she has been sober since March 2016. She reports that she received treatment at [facility] in [place] twice and was admitted to [facility] once for detox.  States "I don't think that there is anything anybody can do to help me." UDS  reports negative results.  BAC reports at 311.  Diagnosis:   Past Medical History:  Past Medical History  Diagnosis Date  . Alcohol abuse     Past Surgical History  Procedure Laterality Date  . Cesarean section  1991  . Wrist surgery Right   . Appendectomy      Family History: History reviewed. No pertinent family history.  Social History:  reports that she has never smoked. She has never used smokeless tobacco. She reports that she does not drink alcohol or use illicit drugs.  Additional Social History:  Alcohol / Drug Use History of alcohol / drug use?: Yes Substance #1 Name of Substance 1: Alcohol 1 - Age of First Use: 18 1 - Amount (size/oz): 1 bottle of wine 1 - Frequency: Sober  for 8 months 1 - Last Use / Amount: Today  CIWA: CIWA-Ar BP: 119/67 mmHg Pulse Rate: 86 Nausea and Vomiting: mild nausea with no vomiting Tactile Disturbances: none Tremor: no tremor Auditory Disturbances: not  present Paroxysmal Sweats: no sweat visible Visual Disturbances: not present Anxiety: no anxiety, at ease Headache, Fullness in Head: none present Agitation: normal activity Orientation and Clouding of Sensorium: oriented and can do serial additions CIWA-Ar Total: 1 COWS:    Allergies:  Allergies  Allergen Reactions  . Codeine Nausea And Vomiting  . Tramadol Nausea Only  . Trazodone And Nefazodone Nausea And Vomiting    Home Medications:  (Not in a hospital admission)  OB/GYN Status:  No LMP recorded. Patient is postmenopausal.  General Assessment Data Location of Assessment: [facility] ED TTS Assessment: In system Is this a Tele or Face-to-Face Assessment?: Face-to-Face Is this an Initial Assessment or a Re-assessment for this encounter?: Initial Assessment Marital status: Divorced [place] name: Patty Cruz Is patient pregnant?: No Pregnancy Status: No Living Arrangements: Alone Can pt return to current living arrangement?: Yes Admission Status: Voluntary Is patient capable of signing voluntary admission?: Yes Referral Source: Self/Family/Friend Insurance type: Self Pay  Medical Screening Exam [facility] Walk-in ONLY) Medical Exam completed: Yes  Crisis Care Plan Living Arrangements: Alone Name of Psychiatrist: Denied Name of Therapist: Denied  Education Status Is patient currently in school?: No Current Grade: n/a Highest grade of school patient has completed: Bachelors Degree Name of school: [employer] person: n/a  Risk to self with the past 6 months Suicidal Ideation: No Has patient been a risk to self within the past 6  months prior to admission? : No Suicidal Intent: No Has patient had any suicidal intent within the past 6 months prior to admission? : No Is patient at risk for suicide?: No Suicidal Plan?: No Has patient had any suicidal plan within the past 6 months prior to admission? : No Access to Means: No What has been your use of drugs/alcohol within  the last 12 months?: Use of alcohol - prior treatment Previous Attempts/Gestures: No How many times?: 0 Other Self Harm Risks: Denied Triggers for Past Attempts: None known Intentional Self Injurious Behavior: None Family Suicide History: No Recent stressful life event(s): Job Loss (Started working at [employer], job is demanding) Persecutory voices/beliefs?: No Depression: No Depression Symptoms:  (Denied) Substance abuse history and/or treatment for substance abuse?: Yes Suicide prevention information given to non-admitted patients: Not applicable  Risk to Others within the past 6 months Homicidal Ideation: No Does patient have any lifetime risk of violence toward others beyond the six months prior to admission? : No Thoughts of Harm to Others: No Current Homicidal Intent: No Current Homicidal Plan: No Access to Homicidal Means: No Identified Victim: None reported History of harm to others?: No Assessment of Violence: None Noted Violent Behavior Description: None reported Does patient have access to weapons?: No Criminal Charges Pending?: No Does patient have a court date: No Is patient on probation?: No  Psychosis Hallucinations: None noted Delusions: None noted  Mental Status Report Appearance/Hygiene: In scrubs, Unremarkable Eye Contact: Good Motor Activity: Restlessness Speech: Unremarkable Level of Consciousness: Alert Mood: Ashamed/humiliated, Pleasant (Repeated, "I know what I need to do") Affect: Flat Anxiety Level: None Thought Processes: Coherent Judgement: Unimpaired Orientation: Person, Place, Situation Obsessive Compulsive Thoughts/Behaviors: None  Cognitive Functioning Concentration: Normal Memory: Recent Intact IQ: Average Insight: Good Impulse Control: Fair Appetite: Poor Sleep: Decreased Vegetative Symptoms: None  ADLScreening [facility] Assessment Services) Patient's cognitive ability adequate to safely complete daily activities?: Yes Patient  able to express need for assistance with ADLs?: Yes Independently performs ADLs?: Yes (appropriate for developmental age)  Prior Inpatient Therapy Prior Inpatient Therapy: Yes Prior Therapy Dates: March [facility]  Reason for Treatment: Alcohol  Prior Outpatient Therapy Prior Outpatient Therapy: Yes Prior Therapy Dates: 2001 Prior Therapy [facility]ilty/Provider([facility]): [facility] [place] [place] Reason for Treatment: Depression Does patient have an ACCT team?: No Does patient have Intensive In-House Services?  : No Does patient have [facility] services? : No Does patient have P4CC services?: No  ADL Screening (condition at time of admission) Patient's cognitive ability adequate to safely complete daily activities?: Yes Patient able to express need for assistance with ADLs?: Yes Independently performs ADLs?: Yes (appropriate for developmental age)       Abuse/Neglect Assessment (Assessment to be complete while patient is alone) Physical Abuse: Denies Verbal Abuse: Denies Sexual Abuse: Denies Exploitation of patient/patient's resources: Denies Self-Neglect: Denies Values / Beliefs Cultural Requests During Hospitalization: None Spiritual Requests During Hospitalization: None   Advance Directives (For Healthcare) Does patient have an advance directive?: No Would patient like information on creating an advanced directive?: No - patient declined information    Additional Information 1:1 In Past 12 Months?: No CIRT Risk: No Elopement Risk: No Does patient have medical clearance?: Yes     Disposition:  Disposition Initial Assessment Completed for this Encounter: Yes Disposition of Patient: Other dispositions  On Site Evaluation by:   Reviewed with Physician:    Justice DeedsKeisha Dell Briner 09/24/2015 9:43 PM

## 2015-09-24 NOTE — ED Notes (Signed)
Report to dawn, rn.  

## 2015-09-24 NOTE — ED Notes (Addendum)
Pt arrived by EMS from home. EMS reports pt started drinking alcohol on Friday and has been drinking since. EMS reports patient wants help with detox. Pt denies HI, hallucinations. Pt reports SI, but denies plan. Pt reports last drink was 1 hour PTA.

## 2015-09-24 NOTE — ED Notes (Signed)
Report from sonja, rn.  

## 2015-09-24 NOTE — ED Notes (Signed)
Pts paper with prayer wanded and cleared by security.

## 2015-09-24 NOTE — ED Notes (Signed)
Patient moved to ED BHU6 without difficulty.  Patient oriented to unit and informed of use of cameras in all rooms for patient safety.

## 2015-09-25 DIAGNOSIS — F1024 Alcohol dependence with alcohol-induced mood disorder: Secondary | ICD-10-CM

## 2015-09-25 DIAGNOSIS — F10120 Alcohol abuse with intoxication, uncomplicated: Secondary | ICD-10-CM | POA: Diagnosis not present

## 2015-09-25 MED ORDER — CHLORDIAZEPOXIDE HCL 25 MG PO CAPS
ORAL_CAPSULE | ORAL | Status: AC
  Administered 2015-09-25: 25 mg via ORAL
  Filled 2015-09-25: qty 1

## 2015-09-25 MED ORDER — FOLIC ACID 1 MG PO TABS
ORAL_TABLET | ORAL | Status: AC
  Administered 2015-09-25: 1 mg via ORAL
  Filled 2015-09-25: qty 1

## 2015-09-25 MED ORDER — FOLIC ACID 1 MG PO TABS
1.0000 mg | ORAL_TABLET | Freq: Every day | ORAL | Status: DC
  Administered 2015-09-25: 1 mg via ORAL

## 2015-09-25 MED ORDER — CHLORDIAZEPOXIDE HCL 25 MG PO CAPS
25.0000 mg | ORAL_CAPSULE | Freq: Three times a day (TID) | ORAL | Status: DC
  Administered 2015-09-25: 25 mg via ORAL

## 2015-09-25 NOTE — ED Notes (Signed)
BEHAVIORAL HEALTH ROUNDING  Patient sleeping: No.  Patient alert and oriented: yes  Behavior appropriate: Yes. ; If no, describe:  Nutrition and fluids offered: Yes  Toileting and hygiene offered: Yes  Sitter present: yes- q15min safety checks  Law enforcement present: Yes  ENVIRONMENTAL ASSESSMENT  Potentially harmful objects out of patient reach: Yes.  Personal belongings secured: Yes.  Patient dressed in hospital provided attire only: Yes.  Plastic bags out of patient reach: Yes.  Patient care equipment (cords, cables, call bells, lines, and drains) shortened, removed, or accounted for: Yes.  Equipment and supplies removed from bottom of stretcher: Yes.  Potentially toxic materials out of patient reach: Yes.  Sharps container removed or out of patient reach: Yes.   

## 2015-09-25 NOTE — BH Assessment (Signed)
Per request of Psych MD (Dr. Faheem), writer provided the pt. with information and instructions on how to access Outpatient Mental Health & Substance Abuse Treatment ([facility], [facility] and [facility]). Also gave the patient, the contact information for [facility]'s Peer Support Specialist (Harvey Bryant-[phone]).   Patient denies SI/HI and AV/H.  

## 2015-09-25 NOTE — ED Notes (Signed)
Pt given lunch tray and sprite  

## 2015-09-25 NOTE — ED Notes (Signed)
BEHAVIORAL HEALTH ROUNDING  Patient sleeping: No.  Patient alert and oriented: yes  Behavior appropriate: Yes. ; If no, describe:  Nutrition and fluids offered: Yes  Toileting and hygiene offered: Yes  Sitter present: yes- 15 min safety sitter  Law enforcement present: Yes  ENVIRONMENTAL ASSESSMENT  Potentially harmful objects out of patient reach: Yes.  Personal belongings secured: Yes.  Patient dressed in hospital provided attire only: Yes.  Plastic bags out of patient reach: Yes.  Patient care equipment (cords, cables, call bells, lines, and drains) shortened, removed, or accounted for: Yes.  Equipment and supplies removed from bottom of stretcher: Yes.  Potentially toxic materials out of patient reach: Yes.  Sharps container removed or out of patient reach: Yes.  

## 2015-09-25 NOTE — ED Notes (Signed)
Pt given breakfast tray

## 2015-09-25 NOTE — ED Provider Notes (Signed)
-----------------------------------------   6:40 AM on 09/25/2015 -----------------------------------------   Blood pressure 117/65, pulse 77, temperature 98.3 F (36.8 C), temperature source Oral, resp. rate 16, SpO2 97 %.  The patient had no acute events since last update.  Calm and cooperative at this time.  Disposition is pending per Psychiatry/Behavioral Medicine team recommendations.     Irean HongJade J Sung, MD 09/25/15 [phone]

## 2015-09-25 NOTE — ED Notes (Signed)
Pt informed to return if any life threatening symptoms occur.  

## 2015-09-25 NOTE — Discharge Instructions (Signed)
Return to the emergency room for any worsening condition including confusion or altered mental status, depression or suicidal thoughts, seizure, or any other symptoms concerning to you.   Alcohol Intoxication Alcohol intoxication occurs when the amount of alcohol that a person has consumed impairs his or her ability to mentally and physically function. Alcohol directly impairs the normal chemical activity of the brain. Drinking large amounts of alcohol can lead to changes in mental function and behavior, and it can cause many physical effects that can be harmful.  Alcohol intoxication can range in severity from mild to very severe. Various factors can affect the level of intoxication that occurs, such as the person's age, gender, weight, frequency of alcohol consumption, and the presence of other medical conditions (such as diabetes, seizures, or heart conditions). Dangerous levels of alcohol intoxication may occur when people drink large amounts of alcohol in a short period (binge drinking). Alcohol can also be especially dangerous when combined with certain prescription medicines or "recreational" drugs. SIGNS AND SYMPTOMS Some common signs and symptoms of mild alcohol intoxication include:  Loss of coordination.  Changes in mood and behavior.  Impaired judgment.  Slurred speech. As alcohol intoxication progresses to more severe levels, other signs and symptoms will appear. These may include:  Vomiting.  Confusion and impaired memory.  Slowed breathing.  Seizures.  Loss of consciousness. DIAGNOSIS  Your health care provider will take a medical history and perform a physical exam. You will be asked about the amount and type of alcohol you have consumed. Blood tests will be done to measure the concentration of alcohol in your blood. In many places, your blood alcohol level must be lower than 80 mg/dL (1.61%0.08%) to legally drive. However, many dangerous effects of alcohol can occur at much  lower levels.  TREATMENT  People with alcohol intoxication often do not require treatment. Most of the effects of alcohol intoxication are temporary, and they go away as the alcohol naturally leaves the body. Your health care provider will monitor your condition until you are stable enough to go home. Fluids are sometimes given through an IV access tube to help prevent dehydration.  HOME CARE INSTRUCTIONS  Do not drive after drinking alcohol.  Stay hydrated. Drink enough water and fluids to keep your urine clear or pale yellow. Avoid caffeine.   Only take over-the-counter or prescription medicines as directed by your health care provider.  SEEK MEDICAL CARE IF:   You have persistent vomiting.   You do not feel better after a few days.  You have frequent alcohol intoxication. Your health care provider can help determine if you should see a substance use treatment counselor. SEEK IMMEDIATE MEDICAL CARE IF:   You become shaky or tremble when you try to stop drinking.   You shake uncontrollably (seizure).   You throw up (vomit) blood. This may be bright red or may look like black coffee grounds.   You have blood in your stool. This may be bright red or may appear as a black, tarry, bad smelling stool.   You become lightheaded or faint.  MAKE SURE YOU:   Understand these instructions.  Will watch your condition.  Will get help right away if you are not doing well or get worse.   This information is not intended to replace advice given to you by your health care provider. Make sure you discuss any questions you have with your health care provider.   Document Released: 08/07/2005 Document Revised: 06/30/2013 Document Reviewed: 04/02/2013  Elsevier Interactive Patient Education [employer]  Alcohol Use Disorder Alcohol use disorder is a mental disorder. It is not a one-time incident of heavy drinking. Alcohol use disorder is the excessive and uncontrollable use of  alcohol over time that leads to problems with functioning in one or more areas of daily living. People with this disorder risk harming themselves and others when they drink to excess. Alcohol use disorder also can cause other mental disorders, such as mood and anxiety disorders, and serious physical problems. People with alcohol use disorder often misuse other drugs.  Alcohol use disorder is common and widespread. Some people with this disorder drink alcohol to cope with or escape from negative life events. Others drink to relieve chronic pain or symptoms of mental illness. People with a family history of alcohol use disorder are at higher risk of losing control and using alcohol to excess.  Drinking too much alcohol can cause injury, accidents, and health problems. One drink can be too much when you are:  Working.  Pregnant or breastfeeding.  Taking medicines. Ask your doctor.  Driving or planning to drive. SYMPTOMS  Signs and symptoms of alcohol use disorder may include the following:   Consumption ofalcohol inlarger amounts or over a longer period of time than intended.  Multiple unsuccessful attempts to cutdown or control alcohol use.   A great deal of time spent obtaining alcohol, using alcohol, or recovering from the effects of alcohol (hangover).  A strong desire or urge to use alcohol (cravings).   Continued use of alcohol despite problems at work, school, or home because of alcohol use.   Continued use of alcohol despite problems in relationships because of alcohol use.  Continued use of alcohol in situations when it is physically hazardous, such as driving a car.  Continued use of alcohol despite awareness of a physical or psychological problem that is likely related to alcohol use. Physical problems related to alcohol use can involve the brain, heart, liver, stomach, and intestines. Psychological problems related to alcohol use include intoxication, depression, anxiety,  psychosis, delirium, and dementia.   The need for increased amounts of alcohol to achieve the same desired effect, or a decreased effect from the consumption of the same amount of alcohol (tolerance).  Withdrawal symptoms upon reducing or stopping alcohol use, or alcohol use to reduce or avoid withdrawal symptoms. Withdrawal symptoms include:  Racing heart.  Hand tremor.  Difficulty sleeping.  Nausea.  Vomiting.  Hallucinations.  Restlessness.  Seizures. DIAGNOSIS Alcohol use disorder is diagnosed through an assessment by your health care provider. Your health care provider may start by asking three or four questions to screen for excessive or problematic alcohol use. To confirm a diagnosis of alcohol use disorder, at least two symptoms must be present within a 46-month period. The severity of alcohol use disorder depends on the number of symptoms:  Mild--two or three.  Moderate--four or five.  Severe--six or more. Your health care provider may perform a physical exam or use results from lab tests to see if you have physical problems resulting from alcohol use. Your health care provider may refer you to a mental health professional for evaluation. TREATMENT  Some people with alcohol use disorder are able to reduce their alcohol use to low-risk levels. Some people with alcohol use disorder need to quit drinking alcohol. When necessary, mental health professionals with specialized training in substance use treatment can help. Your health care provider can help you decide how severe your alcohol use  disorder is and what type of treatment you need. The following forms of treatment are available:   Detoxification. Detoxification involves the use of prescription medicines to prevent alcohol withdrawal symptoms in the first week after quitting. This is important for people with a history of symptoms of withdrawal and for heavy drinkers who are likely to have withdrawal symptoms. Alcohol  withdrawal can be dangerous and, in severe cases, cause death. Detoxification is usually provided in a hospital or in-patient substance use treatment facility.  Counseling or talk therapy. Talk therapy is provided by substance use treatment counselors. It addresses the reasons people use alcohol and ways to keep them from drinking again. The goals of talk therapy are to help people with alcohol use disorder find healthy activities and ways to cope with life stress, to identify and avoid triggers for alcohol use, and to handle cravings, which can cause relapse.  Medicines.Different medicines can help treat alcohol use disorder through the following actions:  Decrease alcohol cravings.  Decrease the positive reward response felt from alcohol use.  Produce an uncomfortable physical reaction when alcohol is used (aversion therapy).  Support groups. Support groups are run by people who have quit drinking. They provide emotional support, advice, and guidance. These forms of treatment are often combined. Some people with alcohol use disorder benefit from intensive combination treatment provided by specialized substance use treatment centers. Both inpatient and outpatient treatment programs are available.   This information is not intended to replace advice given to you by your health care provider. Make sure you discuss any questions you have with your health care provider.   Document Released: 12/05/2004 Document Revised: 11/18/2014 Document Reviewed: 02/04/2013 Elsevier Interactive Patient Education [employer]

## 2015-09-25 NOTE — Consult Note (Signed)
[facility] Face-to-Face Psychiatry Consult   Reason for Consult:  Alcohol use Referring Physician:  Lisa Roca M.D. Patient Identification: Patty Cruz MRN:  [id number] Principal Diagnosis: Alcohol dependence with alcohol-induced mood disorder                                     Major depressive disorder recurrent Diagnosis:   Patient Active Problem List   Diagnosis Date Noted  . Alcohol dependence with alcohol-induced mood disorder ([place]) [F10.24] 12/28/2014  . Alcohol dependence with uncomplicated withdrawal ([place]) [F10.230] 12/27/2014  . Recurrent major depression-severe ([place]) [F33.2] 12/27/2014    Total Time spent with patient: 1 hour  Subjective:   Patty Cruz is a 59 y.o. female patient admitted alcohol level of more than 300. She presented to the ER on a voluntary Basis as she was seeking help due to her alcohol use. Most of the history was obtained from the patient as well as review of her chart.  HPI:    Patient is a 59 year old female with long history of alcohol use who presented to the ER after she relapsed over the weekend. She reported that she has history of sobriety for almost a year but she decided to cancel her will and she is started drinking over the weekend. He stated that she was not feeling good as she was sober since March. She drank all day on Friday and off and on on Saturday. On Sunday morning she was too scared to quit drinking so she decided to come to the hospital to seek help. Patient stated that she does not have any history of withdrawal seizures and shakes or blackouts. Patient reported that she was feeling depressed but denies any feeling of hopelessness or helplessness. She denied having any suicidal homicidal ideations or plans. She reported that she spends time exercising and reading and walks 3-4 miles daily. She has also been volunteering on a daily basis. She has good support of her daughters who lives in [place] and son who lives in [place]. She also  attends [employer]. Her primary care physician is [facility]. She stated that she follows with them on a regular basis and is interested in going to IOP program.  Past Psychiatric History:   Patient has long history of drinking alcohol on a heavy basis. She stated that she is currently sober and has not been drinking regularly. Her last admission was in March. She stated that she remembers me from her previous admissions to the hospital. She stated that she does not take any psychotropic medications at this time.  Risk to Self: Suicidal Ideation: No Suicidal Intent: No Is patient at risk for suicide?: No Suicidal Plan?: No Access to Means: No What has been your use of drugs/alcohol within the last 12 months?: Use of alcohol - prior treatment How many times?: 0 Other Self Harm Risks: Denied Triggers for Past Attempts: None known Intentional Self Injurious Behavior: None Risk to Others: Homicidal Ideation: No Thoughts of Harm to Others: No Current Homicidal Intent: No Current Homicidal Plan: No Access to Homicidal Means: No Identified Victim: None reported History of harm to others?: No Assessment of Violence: None Noted Violent Behavior Description: None reported Does patient have access to weapons?: No Criminal Charges Pending?: No Does patient have a court date: No Prior Inpatient Therapy: Prior Inpatient Therapy: Yes Prior Therapy Dates: March Prior Therapy Facilty/Provider(s): [facility]  Reason  for Treatment: Alcohol Prior Outpatient Therapy: Prior Outpatient Therapy: Yes Prior Therapy Dates: 2001 Prior Therapy Facilty/Provider(s): [facility] [place] Reason for Treatment: Depression Does patient have an ACCT team?: No Does patient have Intensive In-House Services?  : No Does patient have [facility] services? : No Does patient have P4CC services?: No  Past Medical History:  Past Medical History  Diagnosis Date  . Alcohol abuse     Past  Surgical History  Procedure Laterality Date  . Cesarean section  1991  . Wrist surgery Right   . Appendectomy     Family History: History reviewed. No pertinent family history. Family Psychiatric  History: She denied any history of suicide attempts in the family. Social History:  History  Alcohol Use No     History  Drug Use No    Social History   Social History  . Marital Status: Married    Spouse Name: N/A  . Number of Children: N/A  . Years of Education: N/A   Social History Main Topics  . Smoking status: Never Smoker   . Smokeless tobacco: Never Used  . Alcohol Use: No  . Drug Use: No  . Sexual Activity: Not Asked   Other Topics Concern  . None   Social History Narrative   Additional Social History:    History of alcohol / drug use?: Yes Name of Substance 1: Alcohol 1 - Age of First Use: 18 1 - Amount (size/oz): 1 bottle of wine 1 - Frequency: Sober  for 8 months 1 - Last Use / Amount: Today                   Allergies:   Allergies  Allergen Reactions  . Codeine Nausea And Vomiting  . Tramadol Nausea Only  . Trazodone And Nefazodone Nausea And Vomiting    Labs:  Results for orders placed or performed during the hospital encounter of 09/24/15 (from the past 48 hour(s))  Ethanol (ETOH)     Status: Abnormal   Collection Time: 09/24/15  2:48 PM  Result Value Ref Range   Alcohol, Ethyl (B) 311 (HH) <5 mg/dL    Comment: CRITICAL RESULT CALLED TO, READ BACK BY AND VERIFIED WITH SANDRA WEAVER AT 1534 ON 09/24/15 BY [facility]        LOWEST DETECTABLE LIMIT FOR SERUM ALCOHOL IS 5 mg/dL FOR MEDICAL PURPOSES ONLY   Comprehensive metabolic panel     Status: Abnormal   Collection Time: 09/24/15  2:48 PM  Result Value Ref Range   Sodium 138 135 - 145 mmol/L   Potassium 4.0 3.5 - 5.1 mmol/L   Chloride 103 101 - 111 mmol/L   CO2 26 22 - 32 mmol/L   Glucose, Bld 107 (H) 65 - 99 mg/dL   BUN 7 6 - 20 mg/dL   Creatinine, Ser 0.60 0.44 - 1.00 mg/dL   Calcium  9.2 8.9 - 10.3 mg/dL   Total Protein 8.4 (H) 6.5 - 8.1 g/dL   Albumin 5.0 3.5 - 5.0 g/dL   AST 54 (H) 15 - 41 U/L   ALT 37 14 - 54 U/L   Alkaline Phosphatase 91 38 - 126 U/L   Total Bilirubin 0.6 0.3 - 1.2 mg/dL   GFR calc non Af Amer >60 >60 mL/min   GFR calc Af Amer >60 >60 mL/min    Comment: (NOTE) The eGFR has been calculated using the CKD EPI equation. This calculation has not been validated in all clinical situations. eGFR's persistently <  60 mL/min signify possible Chronic Kidney Disease.    Anion gap 9 5 - 15  CBC     Status: Abnormal   Collection Time: 09/24/15  2:48 PM  Result Value Ref Range   WBC 4.7 3.6 - 11.0 K/uL   RBC 4.55 3.80 - 5.20 MIL/uL   Hemoglobin 15.4 12.0 - 16.0 g/dL   HCT 46.5 35.0 - 47.0 %   MCV 102.4 (H) 80.0 - 100.0 fL   MCH 33.8 26.0 - 34.0 pg   MCHC 33.0 32.0 - 36.0 g/dL   RDW 13.8 11.5 - 14.5 %   Platelets 279 150 - 440 K/uL  Urine Drug Screen, Qualitative ([facility] only)     Status: None   Collection Time: 09/24/15  2:48 PM  Result Value Ref Range   Tricyclic, Ur Screen NONE DETECTED NONE DETECTED   Amphetamines, Ur Screen NONE DETECTED NONE DETECTED   MDMA (Ecstasy)Ur Screen NONE DETECTED NONE DETECTED   Cocaine Metabolite,Ur [place] NONE DETECTED NONE DETECTED   Opiate, Ur Screen NONE DETECTED NONE DETECTED   Phencyclidine (PCP) Ur S NONE DETECTED NONE DETECTED   Cannabinoid 50 Ng, Ur [place] NONE DETECTED NONE DETECTED   Barbiturates, Ur Screen NONE DETECTED NONE DETECTED   Benzodiazepine, Ur Scrn NONE DETECTED NONE DETECTED   Methadone Scn, Ur NONE DETECTED NONE DETECTED    Comment: (NOTE) 774  Tricyclics, urine               Cutoff 1000 ng/mL 200  Amphetamines, urine             Cutoff 1000 ng/mL 300  MDMA (Ecstasy), urine           Cutoff 500 ng/mL 400  Cocaine Metabolite, urine       Cutoff 300 ng/mL 500  Opiate, urine                   Cutoff 300 ng/mL 600  Phencyclidine (PCP), urine      Cutoff 25 ng/mL 700  Cannabinoid, urine               Cutoff 50 ng/mL 800  Barbiturates, urine             Cutoff 200 ng/mL 900  Benzodiazepine, urine           Cutoff 200 ng/mL 1000 Methadone, urine                Cutoff 300 ng/mL 1100 1200 The urine drug screen provides only a preliminary, unconfirmed 1300 analytical test result and should not be used for non-medical 1400 purposes. Clinical consideration and professional judgment should 1500 be applied to any positive drug screen result due to possible 1600 interfering substances. A more specific alternate chemical method 1700 must be used in order to obtain a confirmed analytical result.  1800 Gas chromato graphy / mass spectrometry (GC/MS) is the preferred 1900 confirmatory method.     Current Facility-Administered Medications  Medication Dose Route Frequency Provider Last Rate Last Dose  . chlordiazePOXIDE (LIBRIUM) capsule 25 mg  25 mg Oral TID Rainey Pines, MD      . folic acid (FOLVITE) tablet 1 mg  1 mg Oral Daily Rainey Pines, MD      . LORazepam (ATIVAN) injection 0-4 mg  0-4 mg Intravenous 4 times per day Ahmed Prima, MD   0 mg at 09/24/15 1854  . LORazepam (ATIVAN) injection 0-4 mg  0-4 mg Intravenous Q12H Ahmed Prima, MD  0 mg at 09/24/15 1853  . LORazepam (ATIVAN) tablet 0-4 mg  0-4 mg Oral 4 times per day Ahmed Prima, MD   0 mg at 09/24/15 1854  . LORazepam (ATIVAN) tablet 0-4 mg  0-4 mg Oral Q12H Ahmed Prima, MD   0 mg at 09/24/15 1854  . thiamine (B-1) injection 100 mg  100 mg Intravenous Daily Ahmed Prima, MD   100 mg at 09/24/15 1855  . thiamine (VITAMIN B-1) tablet 100 mg  100 mg Oral Daily Ahmed Prima, MD   100 mg at 09/25/15 7893   Current Outpatient Prescriptions  Medication Sig Dispense Refill  . acamprosate (CAMPRAL) 333 MG tablet Take 2 tablets (666 mg total) by mouth 3 (three) times daily with meals. (Patient not taking: Reported on 09/24/2015) 180 tablet 0  . venlafaxine XR (EFFEXOR-XR) 150 MG 24 hr capsule Take 1 capsule (150 mg  total) by mouth daily with breakfast. (Patient not taking: Reported on 09/24/2015) 30 capsule 0    Musculoskeletal: Strength & Muscle Tone: within normal limits Gait & Station: normal Patient leans: N/A  Psychiatric Specialty Exam: Review of Systems  Psychiatric/Behavioral: Positive for depression and substance abuse. The patient is nervous/anxious and has insomnia.     Blood pressure 140/73, pulse 77, temperature 98 F (36.7 C), temperature source Oral, resp. rate 16, SpO2 98 %.There is no weight on file to calculate BMI.  General Appearance: Casual  Eye Contact::  Fair  Speech:  Clear and Coherent  Volume:  Decreased  Mood:  Anxious and Depressed  Affect:  Appropriate  Thought Process:  Coherent  Orientation:  Full (Time, Place, and Person)  Thought Content:  WDL  Suicidal Thoughts:  No  Homicidal Thoughts:  No  Memory:  Immediate;   Fair  Judgement:  Fair  Insight:  Fair  Psychomotor Activity:  Normal  Concentration:  Fair  Recall:  [employer] of Knowledge:Fair  Language: Fair  Akathisia:  No  Handed:  Right  AIMS (if indicated):     Assets:  Communication Skills Desire for Improvement Social Support Transportation  ADL's:  Intact  Cognition: WNL  Sleep:      Treatment Plan Summary: Medication management  Disposition: Patient does not meet criteria for psychiatric inpatient admission. Refer to IOP. Discussed crisis plan, support from social network, calling 911, coming to the Emergency Department, and calling Suicide Hotline.   Patient does not meet the criteria for inpatient psychiatric hospitalization and will be  discharged from the emergency room.  She is interested in going to the IOP program and will be provided information for the same.  I will offer her Librium at this time to prevent from going into the withdrawal symptoms She will not be given any prescriptions at the time of  discharge from  emergency room.  Discussed case with the staff and  ER  physician and they agreed with plan.   Thank you for allowing me to participate in the care of this patient.    This note was generated in part or whole with voice recognition software. Voice regonition is usually quite accurate but there are transcription errors that can and very often do occur. I apologize for any typographical errors that were not detected and corrected.   Rainey Pines, MD    09/25/2015 10:40 AM

## 2015-10-16 ENCOUNTER — Emergency Department
Admission: EM | Admit: 2015-10-16 | Discharge: 2015-10-17 | Disposition: A | Discharge status: Other Acute Inpatient Hospital | Payer: PRIVATE HEALTH INSURANCE | Attending: Emergency Medicine | Admitting: Emergency Medicine

## 2015-10-16 ENCOUNTER — Encounter: Payer: Self-pay | Admitting: Emergency Medicine

## 2015-10-16 DIAGNOSIS — F1024 Alcohol dependence with alcohol-induced mood disorder: Secondary | ICD-10-CM | POA: Insufficient documentation

## 2015-10-16 DIAGNOSIS — F10220 Alcohol dependence with intoxication, uncomplicated: Secondary | ICD-10-CM | POA: Insufficient documentation

## 2015-10-16 DIAGNOSIS — F10920 Alcohol use, unspecified with intoxication, uncomplicated: Secondary | ICD-10-CM

## 2015-10-16 MED ORDER — ONDANSETRON 4 MG PO TBDP
4.0000 mg | ORAL_TABLET | Freq: Once | ORAL | Status: AC
  Administered 2015-10-16: 4 mg via ORAL

## 2015-10-16 MED ORDER — ONDANSETRON 4 MG PO TBDP
ORAL_TABLET | ORAL | Status: AC
  Filled 2015-10-16: qty 1

## 2015-10-16 MED ORDER — LORAZEPAM 2 MG PO TABS: 0.0000 mg | ORAL_TABLET | Freq: Two times a day (BID) | ORAL | Status: DC

## 2015-10-16 MED ORDER — LORAZEPAM 2 MG PO TABS
0.0000 mg | ORAL_TABLET | Freq: Four times a day (QID) | ORAL | Status: DC
  Administered 2015-10-17 (×3): 1 mg via ORAL
  Filled 2015-10-16: qty 1

## 2015-10-16 MED ORDER — THIAMINE HCL 100 MG/ML IJ SOLN
100.0000 mg | Freq: Every day | INTRAMUSCULAR | Status: DC
Start: 2015-10-17 — End: 2015-10-17

## 2015-10-16 MED ORDER — LORAZEPAM 2 MG/ML IJ SOLN: 0.0000 mg | Freq: Four times a day (QID) | INTRAMUSCULAR | Status: DC

## 2015-10-16 MED ORDER — LORAZEPAM 2 MG/ML IJ SOLN: 0.0000 mg | Freq: Two times a day (BID) | INTRAMUSCULAR | Status: DC

## 2015-10-16 MED ORDER — VITAMIN B-1 100 MG PO TABS
100.0000 mg | ORAL_TABLET | Freq: Every day | ORAL | Status: DC
Start: 2015-10-17 — End: 2015-10-17
  Administered 2015-10-17: 100 mg via ORAL
  Filled 2015-10-16: qty 1

## 2015-10-16 NOTE — ED Notes (Signed)
Pt reports "I am an alcoholic, I was down stairs here last March, because of the alcohol". Pt reports that she has been an alcoholic for 25 years, but can stop drinking for periods of time. Pt reports that sh has not drank like she has been since last Thursday 10/12/15 in a period of time.  Pt reports that she has been drinking a large bottle of liquor a night since 10/12/15. Her last drink was about 4 hours ago. Pt reports that normally when she drinks and stops she does not have withdrawal problems, But when she drinks like she has since Thursday, is when she goes through withdrawals.  Pt reports the symptoms that she has when she is withdrawing are shakes and nausea.

## 2015-10-16 NOTE — ED Notes (Signed)
Patient assigned to appropriate care area. Patient oriented to unit/care area: Informed that, for their safety, care areas are designed for safety and monitored by security cameras at all times; and visiting hours explained to patient. Patient verbalizes understanding, and verbal contract for safety obtained.  Pt in room. No complaints or concerns voiced at this time. No abnormal behavior noted at this time. Will continue to monitor with q15 min checks. ODS officer in area.

## 2015-10-16 NOTE — ED Notes (Signed)
pt presents to ER for alcohol detox pt states she has drank 1 gallon/ day since thursday. last drink was 4 hours ago

## 2015-10-16 NOTE — ED Notes (Signed)
Cell phone received from patient's daughter Phineas InchesKendall Fogelman. Placed in clear plastic bag with pt label on, at nurse desk.

## 2015-10-17 DIAGNOSIS — F10230 Alcohol dependence with withdrawal, uncomplicated: Secondary | ICD-10-CM

## 2015-10-17 LAB — URINE DRUG SCREEN, QUALITATIVE (ARMC ONLY)
Amphetamines, Ur Screen: NOT DETECTED
BARBITURATES, UR SCREEN: NOT DETECTED
Benzodiazepine, Ur Scrn: NOT DETECTED
COCAINE METABOLITE, UR ~~LOC~~: NOT DETECTED
Cannabinoid 50 Ng, Ur ~~LOC~~: NOT DETECTED
MDMA (ECSTASY) UR SCREEN: NOT DETECTED
Methadone Scn, Ur: NOT DETECTED
Opiate, Ur Screen: NOT DETECTED
Phencyclidine (PCP) Ur S: NOT DETECTED
TRICYCLIC, UR SCREEN: NOT DETECTED

## 2015-10-17 LAB — CBC
HCT: 42.1 % (ref 35.0–47.0)
Hemoglobin: 14.1 g/dL (ref 12.0–16.0)
MCH: 33.5 pg (ref 26.0–34.0)
MCHC: 33.5 g/dL (ref 32.0–36.0)
MCV: 100 fL (ref 80.0–100.0)
PLATELETS: 304 10*3/uL (ref 150–440)
RBC: 4.21 MIL/uL (ref 3.80–5.20)
RDW: 12.6 % (ref 11.5–14.5)
WBC: 5.9 10*3/uL (ref 3.6–11.0)

## 2015-10-17 LAB — COMPREHENSIVE METABOLIC PANEL
ALT: 27 U/L (ref 14–54)
AST: 41 U/L (ref 15–41)
Albumin: 4.5 g/dL (ref 3.5–5.0)
Alkaline Phosphatase: 84 U/L (ref 38–126)
Anion gap: 10 (ref 5–15)
BUN: 7 mg/dL (ref 6–20)
CHLORIDE: 95 mmol/L — AB (ref 101–111)
CO2: 28 mmol/L (ref 22–32)
Calcium: 8.5 mg/dL — ABNORMAL LOW (ref 8.9–10.3)
Creatinine, Ser: 0.57 mg/dL (ref 0.44–1.00)
Glucose, Bld: 141 mg/dL — ABNORMAL HIGH (ref 65–99)
POTASSIUM: 4 mmol/L (ref 3.5–5.1)
Sodium: 133 mmol/L — ABNORMAL LOW (ref 135–145)
Total Bilirubin: 0.4 mg/dL (ref 0.3–1.2)
Total Protein: 7.6 g/dL (ref 6.5–8.1)

## 2015-10-17 LAB — ETHANOL: ALCOHOL ETHYL (B): 227 mg/dL — AB (ref <5)

## 2015-10-17 MED ORDER — ONDANSETRON 4 MG PO TBDP
4.0000 mg | ORAL_TABLET | Freq: Once | ORAL | Status: AC
  Administered 2015-10-17: 4 mg via ORAL
  Filled 2015-10-17: qty 1

## 2015-10-17 MED ORDER — ACETAMINOPHEN 325 MG PO TABS
650.0000 mg | ORAL_TABLET | Freq: Four times a day (QID) | ORAL | Status: DC | PRN
  Administered 2015-10-17: 650 mg via ORAL

## 2015-10-17 MED ORDER — ONDANSETRON 4 MG PO TBDP
4.0000 mg | ORAL_TABLET | Freq: Once | ORAL | Status: AC
  Administered 2015-10-17: 4 mg via ORAL

## 2015-10-17 MED ORDER — ONDANSETRON 4 MG PO TBDP
ORAL_TABLET | ORAL | Status: AC
  Administered 2015-10-17: 4 mg via ORAL
  Filled 2015-10-17: qty 1

## 2015-10-17 MED ORDER — PROMETHAZINE HCL 25 MG PO TABS
25.0000 mg | ORAL_TABLET | Freq: Four times a day (QID) | ORAL | Status: DC | PRN
  Administered 2015-10-17: 25 mg via ORAL

## 2015-10-17 MED ORDER — PROMETHAZINE HCL 25 MG PO TABS
ORAL_TABLET | ORAL | Status: AC
  Administered 2015-10-17: 25 mg via ORAL
  Filled 2015-10-17: qty 1

## 2015-10-17 MED ORDER — LORAZEPAM 1 MG PO TABS
ORAL_TABLET | ORAL | Status: AC
Start: 2015-10-17 — End: 2015-10-17
  Administered 2015-10-17: 1 mg via ORAL
  Filled 2015-10-17: qty 1

## 2015-10-17 MED ORDER — LORAZEPAM 1 MG PO TABS
ORAL_TABLET | ORAL | Status: AC
  Administered 2015-10-17: 1 mg via ORAL
  Filled 2015-10-17: qty 1

## 2015-10-17 MED ORDER — LORAZEPAM 2 MG/ML IJ SOLN: 1.0000 mg | Freq: Once | INTRAMUSCULAR | Status: DC

## 2015-10-17 MED ORDER — ACETAMINOPHEN 325 MG PO TABS
ORAL_TABLET | ORAL | Status: AC
  Administered 2015-10-17: 650 mg via ORAL
  Filled 2015-10-17: qty 2

## 2015-10-17 MED ORDER — ONDANSETRON 4 MG PO TBDP
ORAL_TABLET | ORAL | Status: AC
  Filled 2015-10-17: qty 1

## 2015-10-17 NOTE — ED Notes (Signed)

## 2015-10-17 NOTE — ED Notes (Signed)
Report received from Beth RN.

## 2015-10-17 NOTE — ED Notes (Signed)
Patient resting quietly in room. No noted distress or abnormal behaviors noted. Will continue 15 minute checks and observation by security camera for safety. 

## 2015-10-17 NOTE — ED Notes (Signed)
Pt denies SI/HI and AVH. Denies pain. Complaints of nausea and feeling shaky. CIWA score was a 6. RN will speak with MD.

## 2015-10-17 NOTE — ED Notes (Signed)
Keisha in room doing intake

## 2015-10-17 NOTE — ED Notes (Signed)
Psychiatrist at bedside

## 2015-10-17 NOTE — ED Notes (Signed)
Pt in room. No complaints or concerns voiced at this time. No abnormal behavior noted at this time. Will continue to monitor with q15 min checks. ODS officer in area. 

## 2015-10-17 NOTE — ED Notes (Signed)
BEHAVIORAL HEALTH ROUNDING Patient sleeping: No. Patient alert and oriented: yes Behavior appropriate: Yes.   Nutrition and fluids offered: Yes  Toileting and hygiene offered: Yes  Sitter present: q15 min observations Law enforcement present: Yes [employer] 

## 2015-10-17 NOTE — ED Notes (Signed)
Pt does not want shower.  Pt states she is " to weak to take a shower"

## 2015-10-17 NOTE — Discharge Instructions (Signed)

## 2015-10-17 NOTE — ED Notes (Signed)
Patient [facility] from ED ambulatory without difficulty to room #6.  Patient is alert and oriented, calm and cooperative and in no apparent distress on arrival.  Patient is oriented to the unit.  Patient denies any pain.  Patient denies suicidal ideation, homicidal ideation, auditory or visual hallucinations at the current time.  Patient is made aware of security cameras and q.15 minute safety checks.  Patient encouraged to notify staff of any questions or concerns.

## 2015-10-17 NOTE — ED Notes (Signed)
Pt complaining of nausea. Stated she had vomited. Administered phenergan as ordered.

## 2015-10-17 NOTE — ED Notes (Signed)
Pt at nurses station door requesting crackers and juice.

## 2015-10-17 NOTE — BH Assessment (Signed)
Assessment Note  Patty Cruz is an 59 y.o. female. Patty Cruz arrived to the ED by being transported to the ED by her daughter for being intoxicated.  She reports that she started drinking Thursday night real heavily and she reports that she is feeling rough and tired as she is coming off of the alcohol.  She reports that she is not depressed or anxious at this time. She denied having auditory or visual hallucinations. She denied having suicidal or homicidal ideation or intent.  She reports that she wants to be treated for withdrawal. She reports that she drank about 3/4ths of a "jug" of wine today.  She reports that she knows what to do, she just needs to do what she knows to do.  She states that she has a strong support system. BAL = 227      Diagnosis:   Past Medical History:  Past Medical History  Diagnosis Date  . Alcohol abuse     Past Surgical History  Procedure Laterality Date  . Cesarean section  1991  . Wrist surgery Right     Family History: History reviewed. No pertinent family history.  Social History:  reports that she has never smoked. She has never used smokeless tobacco. She reports that she drinks alcohol. She reports that she does not use illicit drugs.  Additional Social History:  Alcohol / Drug Use History of alcohol / drug use?: Yes Substance #1 Name of Substance 1: alcohol 1 - Age of First Use: 18 1 - Amount (size/oz): varies 1 - Frequency: sober approx. 6 months, started drinking a few weeks ago, [facility], and stayed off until thursday. 1 - Last Use / Amount: 10/16/2015  CIWA: CIWA-Ar BP: 116/79 mmHg Pulse Rate: (!) 106 Nausea and Vomiting: intermittent nausea with dry heaves Tactile Disturbances: none Tremor: two Auditory Disturbances: not present Paroxysmal Sweats: no sweat visible Visual Disturbances: not present Anxiety: no anxiety, at ease Headache, Fullness in Head: mild Agitation: normal activity Orientation and Clouding of Sensorium:  oriented and can do serial additions CIWA-Ar Total: 8 COWS:    Allergies:  Allergies  Allergen Reactions  . Codeine Nausea And Vomiting  . Tramadol Nausea Only  . Trazodone And Nefazodone Nausea And Vomiting    Home Medications:  (Not in a hospital admission)  OB/GYN Status:  No LMP recorded. Patient is postmenopausal.  General Assessment Data [facility] Assessment: In system Is this a Tele or Face-to-Face Assessment?: Face-to-Face Is this an Initial Assessment or a Re-assessment for this encounter?: Initial Assessment Marital status: [place]ced [place] Is patient pregnant?: No Living Arrangements: Alone Can pt return to current living arrangement?: Yes Admission Status: Voluntary Is patient capable of signing voluntary admission?: Yes Referral Source: Self/Family/Friend Insurance type: None  Medical Screening Exam [facility] Walk-in ONLY) Medical Exam completed: Yes  Crisis Care Plan Living Arrangements: Alone Name of Psychiatrist:  (None reported) Name of Therapist: None reported  Education Status Is patient currently in school?: No Current Grade: n/a Highest grade of school patient has completed: Bachelors Degree Name of school: [employer] Contact person: n/a  Risk to self with the past 6 months Suicidal Ideation: No Has patient been a risk to self within the past 6 months prior to admission? : No Suicidal Intent: No Has patient had any suicidal intent within the past 6 months prior to admission? : No Is patient at risk for suicide?: No Suicidal Plan?: No Has patient had any suicidal  plan within the past 6 months prior to admission? : No Access to Means: No What has been your use of drugs/alcohol within the last 12 months?: Alcohol misuse Previous Attempts/Gestures: No How many times?: 0 Other Self Harm Risks: None reported Triggers for Past Attempts: None known Intentional Self Injurious Behavior: None Family Suicide  History: No Recent stressful life event(s):  (She reports work is physically stressful) Persecutory voices/beliefs?: No Depression: No Depression Symptoms:  (Denied) Substance abuse history and/or treatment for substance abuse?: Yes Suicide prevention information given to non-admitted patients: Not applicable  Risk to Others within the past 6 months Homicidal Ideation: No Does patient have any lifetime risk of violence toward others beyond the six months prior to admission? : No Thoughts of Harm to Others: No Current Homicidal Intent: No Current Homicidal Plan: No Access to Homicidal Means: No Identified Victim: None reported History of harm to others?: No Assessment of Violence: None Noted Violent Behavior Description: None reported Does patient have access to weapons?: No Criminal Charges Pending?: No Does patient have a court date: No Is patient on probation?: No  Psychosis Hallucinations: None noted Delusions: None noted  Mental Status Report Appearance/Hygiene: In hospital gown, In scrubs Eye Contact: Fair Motor Activity: Unremarkable Speech: Slow, Slurred Level of Consciousness: Drowsy Mood: Euthymic Affect: Appropriate to circumstance Anxiety Level: None Thought Processes: Coherent Judgement: Unimpaired Orientation: Place, Person, Situation, Time Obsessive Compulsive Thoughts/Behaviors: None  Cognitive Functioning Concentration: Normal Memory: Recent Intact IQ: Average Insight: Fair Impulse Control: Fair Appetite: Good Sleep: No Change Vegetative Symptoms: None  ADLScreening [facility] Assessment Services) Patient's cognitive ability adequate to safely complete daily activities?: Yes Patient able to express need for assistance with ADLs?: Yes Independently performs ADLs?: Yes (appropriate for developmental age)  Prior Inpatient Therapy Prior Inpatient Therapy: Yes Prior Therapy Dates: March Prior [facility] Facilty[facility]Provider[facility]): [facility]  Reason  for Treatment: Alcohol  Prior Outpatient Therapy Prior Outpatient Therapy: Yes Prior Therapy Dates: 2001 Prior Therapy Facilty/Provider(s): [facility], [place] Reason for Treatment: Depression Does patient have an ACCT team?: No Does patient have Intensive In-House Services?  : No Does patient have [facility] services? : No Does patient have P4CC services?: No  ADL Screening (condition at time of admission) Patient's cognitive ability adequate to safely complete daily activities?: Yes Patient able to express need for assistance with ADLs?: Yes Independently performs ADLs?: Yes (appropriate for developmental age)       Abuse/Neglect Assessment (Assessment to be complete while patient is alone) Physical Abuse: Denies Verbal Abuse: Denies Sexual Abuse: Denies Exploitation of patient/patient's resources: Denies Self-Neglect: Denies Values / Beliefs Cultural Requests During Hospitalization: None Spiritual Requests During Hospitalization: None   Advance Directives (For Healthcare) Does patient have an advance directive?: Yes Would patient like information on creating an advanced directive?: No - patient declined information Type of Advance Directive: Living will Copy of advanced directive(s) in chart?: No - copy requested    Additional Information 1:1 In Past 12 Months?: Yes CIRT Risk: No Elopement Risk: No Does patient have medical clearance?: Yes     Disposition:  Disposition Initial Assessment Completed for this Encounter: Yes Disposition of Patient: Other dispositions (To ) Type of outpatient treatment: Adult, Chemical Dependence - Intensive Outpatient  On Site Evaluation by:   Reviewed with Physician:    Justice Deeds 10/17/2015 2:31 AM

## 2015-10-17 NOTE — ED Notes (Signed)
Patient laying in bed watching tv. No noted distress or abnormal behaviors noted. Will continue 15 minute checks and observation by security camera for safety.

## 2015-10-17 NOTE — ED Notes (Signed)
Patient resting quietly in room, watching tv. No noted distress or abnormal behaviors noted. Will continue 15 minute checks and observation by security camera for safety.

## 2015-10-17 NOTE — ED Notes (Signed)
Patient asleep in room. No noted distress or abnormal behavior. Will continue 15 minute checks and observation by security cameras for safety. 

## 2015-10-17 NOTE — ED Notes (Signed)
Pt resting in bed. Pt is attempting to eat crackers. Pt believes she may be nauseous because she "hasn't really eatten since Thursday."  Q 15 min checks in progress. Monitored for safety.

## 2015-10-17 NOTE — ED Provider Notes (Signed)
[facility] Emergency Department Provider Note  ____________________________________________  Time seen: 12:30 AM  I have reviewed the triage vital signs and the nursing notes.   HISTORY  Chief Complaint Alcohol Problem      HPI Patty Cruz is a 59 y.o. female presents with request for alcohol detox. Patient states that she drink approximately gallon of wine per day since Thursday. Last alcohol ingestion was approximately 4 hours ago.patient states last time she was a detox was March 2016. Patient admits to being an alcoholic for approximately 25 years. Last time of sobriety approximately 2 weeks ago.     Past Medical History  Diagnosis Date  . Alcohol abuse     Patient Active Problem List   Diagnosis Date Noted  . Acute alcohol intoxication (HCC)   . Alcohol dependence with alcohol-induced mood disorder (HCC) 12/28/2014  . Alcohol dependence with uncomplicated withdrawal (HCC) 12/27/2014  . Recurrent major depression-severe (HCC) 12/27/2014    Past Surgical History  Procedure Laterality Date  . Cesarean section  1991  . Wrist surgery Right     Current Outpatient Rx  Name  Route  Sig  Dispense  Refill  . acamprosate (CAMPRAL) 333 MG tablet   Oral   Take 2 tablets (666 mg total) by mouth 3 (three) times daily with meals. Patient not taking: Reported on 09/24/2015   180 tablet   0   . venlafaxine XR (EFFEXOR-XR) 150 MG 24 hr capsule   Oral   Take 1 capsule (150 mg total) by mouth daily with breakfast. Patient not taking: Reported on 09/24/2015   30 capsule   0     Allergies Codeine; Tramadol; and Trazodone and nefazodone  History reviewed. No pertinent family history.  Social History Social History  Substance Use Topics  . Smoking status: Never Smoker   . Smokeless tobacco: Never Used  . Alcohol Use: Yes    Review of Systems  Constitutional: Negative for fever. Eyes: Negative for visual changes. ENT: Negative for sore  throat. Cardiovascular: Negative for chest pain. Respiratory: Negative for shortness of breath. Gastrointestinal: Negative for abdominal pain, vomiting and diarrhea. Genitourinary: Negative for dysuria. Musculoskeletal: Negative for back pain. Skin: Negative for rash. Neurological: Negative for headaches, focal weakness or numbness. Psychiatric:positive alcohol abuse  10-point ROS otherwise negative.  ____________________________________________   PHYSICAL EXAM:  VITAL SIGNS: ED Triage Vitals  Enc Vitals Group     BP 10/16/15 2234 134/81 mmHg     Pulse Rate 10/16/15 2234 120     Resp 10/16/15 2234 20     Temp 10/16/15 2234 98.6 F (37 C)     Temp Source 10/16/15 2234 Oral     SpO2 10/16/15 2234 94 %     Weight 10/16/15 2234 115 lb (52.164 kg)     Height 10/16/15 2234 5\' 5"  (1.651 m)     Head Cir --      Peak Flow --      Pain Score --      Pain Loc --      Pain Edu? --      Excl. in GC? --      Constitutional: Alert and oriented. Alcohol on breath Eyes: Conjunctivae are normal. PERRL. Normal extraocular movements. ENT   Head: Normocephalic and atraumatic.   Nose: No congestion/rhinnorhea.   Mouth/Throat: Mucous membranes are moist.   Neck: No stridor. Hematological/Lymphatic/Immunilogical: No cervical lymphadenopathy. Cardiovascular: Normal rate, regular rhythm. Normal and symmetric distal pulses are present in all extremities.  No murmurs, rubs, or gallops. Respiratory: Normal respiratory effort without tachypnea nor retractions. Breath sounds are clear and equal bilaterally. No wheezes/rales/rhonchi. Gastrointestinal: Soft and nontender. No distention. There is no CVA tenderness. Genitourinary: deferred Musculoskeletal: Nontender with normal range of motion in all extremities. No joint effusions.  No lower extremity tenderness nor edema. Neurologic:  Normal speech and language. No gross focal neurologic deficits are appreciated. Speech is normal.   Skin:  Skin is warm, dry and intact. No rash noted. Psychiatric: Mood and affect are normal. Speech and behavior are normal. Patient exhibits appropriate insight and judgment.  ____________________________________________    LABS (pertinent positives/negatives)  Labs Reviewed  COMPREHENSIVE METABOLIC PANEL - Abnormal; Notable for the following:    Sodium 133 (*)    Chloride 95 (*)    Glucose, Bld 141 (*)    Calcium 8.5 (*)    All other components within normal limits  ETHANOL - Abnormal; Notable for the following:    Alcohol, Ethyl (B) 227 (*)    All other components within normal limits  CBC  URINE DRUG SCREEN, QUALITATIVE ([facility] ONLY)       INITIAL IMPRESSION / ASSESSMENT AND PLAN / ED COURSE  Pertinent labs & imaging results that were available during my care of the patient were reviewed by me and considered in my medical decision making (see chart for details).    ____________________________________________   FINAL CLINICAL IMPRESSION(S) / ED DIAGNOSES  Final diagnoses:  Acute alcohol intoxication, uncomplicated (HCC)  Alcohol dependence with alcohol-induced mood disorder (HCC)      Darci Current, MD 10/17/15 2307

## 2015-10-17 NOTE — ED Provider Notes (Signed)
-----------------------------------------   6:34 PM on 10/17/2015 -----------------------------------------  Patient has been seen and evaluated by psychiatry. Patient is now sober. They believe the patient is safe for discharge home. She has follow-up with AA, as well as in her local church. Patient is voluntary, and wishes to be discharged.  Minna AntisKevin Raiza Kiesel, MD 10/17/15 [phone]

## 2015-10-17 NOTE — Consult Note (Signed)
[place] [facility] Consult   Reason for Consult:  Consult for this 59 year old woman with a history of alcohol dependence who came in to the emergency room intoxicated last night Referring Physician:  [facility] Patient Identification: Patty Cruz MRN:  [id number] Principal Diagnosis: Acute alcohol intoxication [facility]) Diagnosis:   Patient Active Problem List   Diagnosis Date Noted  . Nausea [R11.0] 10/17/2015  . Acute alcohol intoxication ([place]) [F10.129]   . Alcohol dependence with alcohol-induced mood disorder ([place]) [F10.24] 12/28/2014  . Alcohol dependence with uncomplicated withdrawal ([place]) [F10.230] 12/27/2014  . Recurrent major depression-severe ([place]) [F33.2] 12/27/2014    Total Time spent with patient: 45 minutes  Subjective:   Patty Cruz is a 59 y.o. female patient admitted with "I've been drinking heavily".  HPI:  Information from the patient and the chart. Patient interviewed. Reviewed the current chart including the intake from the counselor last night. Labs reviewed. Reviewed old chart. Patient is known to me from prior admissions as well. 59 year old woman with a history of alcohol dependence states that she had been having some sobriety over the summer but then relapsed a few weeks ago. She had been keeping the amount that she was drinking under control until this past Thursday when she was feeling a little depressed and sorry for herself. She started drinking wine heavily and has been drinking a jogger so wine a day since then. Her daughter came over to check on her last night and found her intoxicated and brought her into the emergency room. Patient had a blood alcohol level over 200. She on interview today denies that she's feeling depressed. Denies any suicidal ideation. Denies any hallucinations. Denies that she's using any other drugs. She says that she knows she shouldn't of stopped going to [employer] and that she needs to rely on her sponsor and people who  were there to support her. She is not getting any other outpatient psychiatric treatment. Patient reports that today her main symptom is being sick to her stomach. She is felt nauseous and has thrown up a couple times. She is not tremulous however and not confused or having any hallucinations.  Social history: Patient lives by herself. She is now working at [employer]. She recognizes that this is a step down from the kind of work she used to do but she also acknowledges being grateful that she's been able to have a steady job at all. Daughter still checks in on her regularly some other family are still involved.  Medical history: Patient has a long history of alcohol abuse. No other significant ongoing medical problems.  Substance abuse history: Alcohol dependence without a history of alcohol withdrawal seizures.  Past Psychiatric History: Patient has had prior psychiatric hospitalizations and has had treatment with depression medicine in the past although she has rarely if ever been compliant with outpatient psychiatric treatment. Mood stays pretty stable when she is sober. No history of suicide attempts  Risk to Self: Suicidal Ideation: No Suicidal Intent: No Is patient at risk for suicide?: No Suicidal Plan?: No Access to Means: No What has been your use of drugs/alcohol within the last 12 months?: Alcohol misuse How many times?: 0 Other Self Harm Risks: None reported Triggers for Past Attempts: None known Intentional Self Injurious Behavior: None Risk to Others: Homicidal Ideation: No Thoughts of Harm to Others: No Current Homicidal Intent: No Current Homicidal Plan: No Access to Homicidal Means: No Identified Victim: None reported History of harm to others?: No Assessment of  Violence: None Noted Violent Behavior Description: None reported Does patient have access to weapons?: No Criminal Charges Pending?: No Does patient have a court date: No Prior Inpatient Therapy: Prior  Inpatient Therapy: Yes Prior Therapy Dates: March Prior Therapy Facilty/Provider(s): [facility], [facility]  Reason for Treatment: Alcohol Prior Outpatient Therapy: Prior Outpatient Therapy: Yes Prior Therapy Dates: 2001 Prior Therapy Facilty/Provider(s): [facility], [place] Reason for Treatment: Depression Does patient have an ACCT team?: No Does patient have Intensive In-House Services?  : No Does patient have [facility] services? : No Does patient have P4CC services?: No  Past Medical History:  Past Medical History  Diagnosis Date  . Alcohol abuse     Past Surgical History  Procedure Laterality Date  . Cesarean section  1991  . Wrist surgery Right    Family History: History reviewed. No pertinent family history. Family Psychiatric  History: There is positive family history of alcohol abuse Social History:  History  Alcohol Use  . Yes     History  Drug Use No    Social History   Social History  . Marital Status: Divorced    Spouse Name: N/A  . Number of Children: N/A  . Years of Education: N/A   Social History Main Topics  . Smoking status: Never Smoker   . Smokeless tobacco: Never Used  . Alcohol Use: Yes  . Drug Use: No  . Sexual Activity: Not Asked   Other Topics Concern  . None   Social History Narrative   Additional Social History:    History of alcohol / drug use?: Yes Name of Substance 1: alcohol 1 - Age of First Use: 18 1 - Amount (size/oz): varies 1 - Frequency: sober approx. 6 months, started drinking a few weeks ago, came to [facility], and stayed off until thursday. 1 - Last Use / Amount: 10/16/2015                   Allergies:   Allergies  Allergen Reactions  . Codeine Nausea And Vomiting  . Tramadol Nausea Only  . Trazodone And Nefazodone Nausea And Vomiting    Labs:  Results for orders placed or performed during the hospital encounter of 10/16/15 (from the past 48 hour(s))  CBC     Status: None   Collection Time: 10/17/15  12:10 AM  Result Value Ref Range   WBC 5.9 3.6 - 11.0 K/uL   RBC 4.21 3.80 - 5.20 MIL/uL   Hemoglobin 14.1 12.0 - 16.0 g/dL   HCT 42.1 35.0 - 47.0 %   MCV 100.0 80.0 - 100.0 fL   MCH 33.5 26.0 - 34.0 pg   MCHC 33.5 32.0 - 36.0 g/dL   RDW 12.6 11.5 - 14.5 %   Platelets 304 150 - 440 K/uL  Comprehensive metabolic panel     Status: Abnormal   Collection Time: 10/17/15 12:10 AM  Result Value Ref Range   Sodium 133 (L) 135 - 145 mmol/L   Potassium 4.0 3.5 - 5.1 mmol/L   Chloride 95 (L) 101 - 111 mmol/L   CO2 28 22 - 32 mmol/L   Glucose, Bld 141 (H) 65 - 99 mg/dL   BUN 7 6 - 20 mg/dL   Creatinine, Ser 0.57 0.44 - 1.00 mg/dL   Calcium 8.5 (L) 8.9 - 10.3 mg/dL   Total Protein 7.6 6.5 - 8.1 g/dL   Albumin 4.5 3.5 - 5.0 g/dL   AST 41 15 - 41 U/L   ALT 27  14 - 54 U/L   Alkaline Phosphatase 84 38 - 126 U/L   Total Bilirubin 0.4 0.3 - 1.2 mg/dL   GFR calc non Af Amer >60 >60 mL/min   GFR calc Af Amer >60 >60 mL/min    Comment: (NOTE) The eGFR has been calculated using the CKD EPI equation. This calculation has not been validated in all clinical situations. eGFR's persistently <60 mL/min signify possible Chronic Kidney Disease.    Anion gap 10 5 - 15  Ethanol     Status: Abnormal   Collection Time: 10/17/15 12:10 AM  Result Value Ref Range   Alcohol, Ethyl (B) 227 (H) <5 mg/dL    Comment:        LOWEST DETECTABLE LIMIT FOR SERUM ALCOHOL IS 5 mg/dL FOR MEDICAL PURPOSES ONLY   Urine Drug Screen, Qualitative ([facility] only)     Status: None   Collection Time: 10/17/15 12:10 AM  Result Value Ref Range   Tricyclic, Ur Screen NONE DETECTED NONE DETECTED   Amphetamines, Ur Screen NONE DETECTED NONE DETECTED   MDMA (Ecstasy)Ur Screen NONE DETECTED NONE DETECTED   Cocaine Metabolite,Ur [place] NONE DETECTED NONE DETECTED   Opiate, Ur Screen NONE DETECTED NONE DETECTED   Phencyclidine (PCP) Ur S NONE DETECTED NONE DETECTED   Cannabinoid 50 Ng, Ur [place] NONE DETECTED NONE DETECTED   Barbiturates,  Ur Screen NONE DETECTED NONE DETECTED   Benzodiazepine, Ur Scrn NONE DETECTED NONE DETECTED   Methadone Scn, Ur NONE DETECTED NONE DETECTED    Comment: (NOTE) 665  Tricyclics, urine               Cutoff 1000 ng/mL 200  Amphetamines, urine             Cutoff 1000 ng/mL 300  MDMA (Ecstasy), urine           Cutoff 500 ng/mL 400  Cocaine Metabolite, urine       Cutoff 300 ng/mL 500  Opiate, urine                   Cutoff 300 ng/mL 600  Phencyclidine (PCP), urine      Cutoff 25 ng/mL 700  Cannabinoid, urine              Cutoff 50 ng/mL 800  Barbiturates, urine             Cutoff 200 ng/mL 900  Benzodiazepine, urine           Cutoff 200 ng/mL 1000 Methadone, urine                Cutoff 300 ng/mL 1100 1200 The urine drug screen provides only a preliminary, unconfirmed 1300 analytical test result and should not be used for non-medical 1400 purposes. Clinical consideration and professional judgment should 1500 be applied to any positive drug screen result due to possible 1600 interfering substances. A more specific alternate chemical method 1700 must be used in order to obtain a confirmed analytical result.  1800 Gas chromato graphy / mass spectrometry (GC/MS) is the preferred 1900 confirmatory method.     Current Facility-Administered Medications  Medication Dose Route Frequency Provider Last Rate Last Dose  . acetaminophen (TYLENOL) tablet 650 mg  650 mg Oral Q6H PRN Lavonia Drafts, MD   650 mg at 10/17/15 0830  . LORazepam (ATIVAN) injection 0-4 mg  0-4 mg Intravenous 4 times per day Gregor Hams, MD   0 mg at 10/17/15 0111  . LORazepam (ATIVAN) injection 0-4 mg  0-4 mg Intravenous Q12H Gregor Hams, MD   0 mg at 10/17/15 0112  . LORazepam (ATIVAN) injection 1 mg  1 mg Intravenous Once Lavonia Drafts, MD   1 mg at 10/17/15 0900  . LORazepam (ATIVAN) tablet 0-4 mg  0-4 mg Oral 4 times per day Gregor Hams, MD   1 mg at 10/17/15 0830  . LORazepam (ATIVAN) tablet 0-4 mg  0-4 mg  Oral Q12H Gregor Hams, MD   0 mg at 10/17/15 0117  . promethazine (PHENERGAN) tablet 25 mg  25 mg Oral Q6H PRN Gonzella Lex, MD   25 mg at 10/17/15 1655  . thiamine (B-1) injection 100 mg  100 mg Intravenous Daily Gregor Hams, MD   100 mg at 10/17/15 1004  . thiamine (VITAMIN B-1) tablet 100 mg  100 mg Oral Daily Gregor Hams, MD   100 mg at 10/17/15 0254   Current Outpatient Prescriptions  Medication Sig Dispense Refill  . acamprosate (CAMPRAL) 333 MG tablet Take 2 tablets (666 mg total) by mouth 3 (three) times daily with meals. (Patient not taking: Reported on 09/24/2015) 180 tablet 0  . venlafaxine XR (EFFEXOR-XR) 150 MG 24 hr capsule Take 1 capsule (150 mg total) by mouth daily with breakfast. (Patient not taking: Reported on 09/24/2015) 30 capsule 0    Musculoskeletal: Strength & Muscle Tone: decreased Gait & Station: normal Patient leans: N/A  Psychiatric Specialty Exam: Review of Systems  Constitutional: Positive for malaise/fatigue.  HENT: Negative.   Eyes: Negative.   Respiratory: Negative.   Cardiovascular: Negative.   Gastrointestinal: Positive for nausea and vomiting.  Musculoskeletal: Negative.   Skin: Negative.   Neurological: Positive for weakness.  Psychiatric/Behavioral: Positive for substance abuse. Negative for depression, suicidal ideas, hallucinations and memory loss. The patient has insomnia. The patient is not nervous/anxious.     Blood pressure 108/66, pulse 90, temperature 97.8 F (36.6 C), temperature source Oral, resp. rate 18, height _0  (1.651 m), weight 52.164 kg (115 lb), SpO2 99 %.Body mass index is 19.14 kg/(m^2).  General Appearance: Disheveled  Eye Contact::  Good  Speech:  Normal Rate  Volume:  Normal  Mood:  Euthymic  Affect:  Constricted  Thought Process:  Goal Directed  Orientation:  Full (Time, Place, and Person)  Thought Content:  Negative  Suicidal Thoughts:  No  Homicidal Thoughts:  No  Memory:  Immediate;    Good Recent;   Fair Remote;   Good  Judgement:  Fair  Insight:  Fair  Psychomotor Activity:  Decreased  Concentration:  Fair  Recall:  [place] of Knowledge:Good  Language: Good  Akathisia:  No  Handed:  Right  AIMS (if indicated):     Assets:  Communication Skills Desire for Improvement Housing Resilience Social Support  ADL's:  Intact  Cognition: WNL  Sleep:      Treatment Plan Summary: Medication management and Plan 59 year old woman with a history of alcohol abuse. She is now sobered up and is not showing any signs of delirium. She has good insight and is able to describe an appropriate outpatient substance abuse treatment plan. There is no sign of suicidality or dangerousness. She does not meet commitment criteria. I spent some time with her doing motivational interviewing and encouraging her in her thought that she needs to get back into active participation in Alcoholics Anonymous and contact her sponsor as soon as possible. Also that she should continue going to church and staying in touch with  her family if that has been helpful for her. Patient does not require hospital treatment. Case reviewed with emergency room doctor. She can be discharged and released from the emergency room home at the discretion of emergency room physician. I did give her a single dose of Phenergan which should help for now. If the nausea and vomiting persists even with sobriety to where she's not able to eat then she can return to the emergency room for further care.  Disposition: Patient does not meet criteria for psychiatric inpatient admission. Supportive therapy provided about ongoing stressors. Discussed crisis plan, support from social network, calling 911, coming to the Emergency Department, and calling Suicide Hotline.  John Clapacs 10/17/2015 5:19 PM

## 2015-10-17 NOTE — ED Notes (Signed)
[facility]

## 2016-02-13 ENCOUNTER — Other Ambulatory Visit: Payer: Self-pay | Admitting: Family Medicine

## 2016-02-13 MED ORDER — PROMETHAZINE HCL 25 MG RE SUPP: 25.0000 mg | Freq: Four times a day (QID) | RECTAL | Status: DC | PRN

## 2016-02-14 ENCOUNTER — Inpatient Hospital Stay (HOSPITAL_COMMUNITY)
Admission: AD | Admit: 2016-02-14 | Payer: No Typology Code available for payment source | Source: Intra-hospital | Admitting: Psychiatry

## 2016-02-14 ENCOUNTER — Emergency Department
Admission: EM | Admit: 2016-02-14 | Discharge: 2016-02-14 | Disposition: A | Discharge status: Home / Self Care | Payer: No Typology Code available for payment source | Attending: Emergency Medicine | Admitting: Emergency Medicine

## 2016-02-14 ENCOUNTER — Encounter: Payer: Self-pay | Admitting: Emergency Medicine

## 2016-02-14 DIAGNOSIS — F329 Major depressive disorder, single episode, unspecified: Secondary | ICD-10-CM | POA: Insufficient documentation

## 2016-02-14 DIAGNOSIS — R45851 Suicidal ideations: Secondary | ICD-10-CM

## 2016-02-14 DIAGNOSIS — F1010 Alcohol abuse, uncomplicated: Secondary | ICD-10-CM

## 2016-02-14 DIAGNOSIS — F10920 Alcohol use, unspecified with intoxication, uncomplicated: Secondary | ICD-10-CM

## 2016-02-14 DIAGNOSIS — F10129 Alcohol abuse with intoxication, unspecified: Secondary | ICD-10-CM | POA: Insufficient documentation

## 2016-02-14 DIAGNOSIS — F32A Depression, unspecified: Secondary | ICD-10-CM

## 2016-02-14 LAB — URINE DRUG SCREEN, QUALITATIVE (ARMC ONLY)
AMPHETAMINES, UR SCREEN: NOT DETECTED
BENZODIAZEPINE, UR SCRN: NOT DETECTED
Barbiturates, Ur Screen: NOT DETECTED
CANNABINOID 50 NG, UR ~~LOC~~: NOT DETECTED
Cocaine Metabolite,Ur ~~LOC~~: NOT DETECTED
MDMA (Ecstasy)Ur Screen: NOT DETECTED
Methadone Scn, Ur: NOT DETECTED
OPIATE, UR SCREEN: NOT DETECTED
PHENCYCLIDINE (PCP) UR S: NOT DETECTED
Tricyclic, Ur Screen: NOT DETECTED

## 2016-02-14 LAB — CBC
HEMATOCRIT: 42.7 % (ref 35.0–47.0)
Hemoglobin: 15 g/dL (ref 12.0–16.0)
MCH: 32.8 pg (ref 26.0–34.0)
MCHC: 35 g/dL (ref 32.0–36.0)
MCV: 93.6 fL (ref 80.0–100.0)
Platelets: 310 10*3/uL (ref 150–440)
RBC: 4.56 MIL/uL (ref 3.80–5.20)
RDW: 12.5 % (ref 11.5–14.5)
WBC: 6.6 10*3/uL (ref 3.6–11.0)

## 2016-02-14 LAB — COMPREHENSIVE METABOLIC PANEL
ALT: 23 U/L (ref 14–54)
AST: 34 U/L (ref 15–41)
Albumin: 4.6 g/dL (ref 3.5–5.0)
Alkaline Phosphatase: 97 U/L (ref 38–126)
Anion gap: 11 (ref 5–15)
BILIRUBIN TOTAL: 0.6 mg/dL (ref 0.3–1.2)
BUN: 7 mg/dL (ref 6–20)
CALCIUM: 8.8 mg/dL — AB (ref 8.9–10.3)
CO2: 29 mmol/L (ref 22–32)
CREATININE: 0.61 mg/dL (ref 0.44–1.00)
Chloride: 94 mmol/L — ABNORMAL LOW (ref 101–111)
GFR calc Af Amer: >60 mL/min (ref >60)
GLUCOSE: 106 mg/dL — AB (ref 65–99)
POTASSIUM: 3.8 mmol/L (ref 3.5–5.1)
Sodium: 134 mmol/L — ABNORMAL LOW (ref 135–145)
TOTAL PROTEIN: 8 g/dL (ref 6.5–8.1)

## 2016-02-14 LAB — ACETAMINOPHEN LEVEL: Acetaminophen (Tylenol), Serum: <10 ug/mL — ABNORMAL LOW (ref 10–30)

## 2016-02-14 LAB — ETHANOL: ALCOHOL ETHYL (B): 393 mg/dL — AB (ref <5)

## 2016-02-14 LAB — SALICYLATE LEVEL: Salicylate Lvl: <4 mg/dL (ref 2.8–30.0)

## 2016-02-14 MED ORDER — THIAMINE HCL 100 MG/ML IJ SOLN
100.0000 mg | Freq: Once | INTRAMUSCULAR | Status: AC
  Administered 2016-02-14: 100 mg via INTRAVENOUS
  Filled 2016-02-14: qty 2

## 2016-02-14 MED ORDER — ONDANSETRON HCL 4 MG/2ML IJ SOLN
INTRAMUSCULAR | Status: AC
Start: 2016-02-14 — End: 2016-02-14
  Administered 2016-02-14: 4 mg via INTRAVENOUS
  Filled 2016-02-14: qty 2

## 2016-02-14 MED ORDER — LORAZEPAM 2 MG/ML IJ SOLN
0.0000 mg | Freq: Four times a day (QID) | INTRAMUSCULAR | Status: DC
  Administered 2016-02-14: 2 mg via INTRAVENOUS
  Filled 2016-02-14: qty 1

## 2016-02-14 MED ORDER — SODIUM CHLORIDE 0.9 % IV BOLUS (SEPSIS)
2000.0000 mL | Freq: Once | INTRAVENOUS | Status: AC
  Administered 2016-02-14: 2000 mL via INTRAVENOUS

## 2016-02-14 MED ORDER — LORAZEPAM 2 MG PO TABS: 0.0000 mg | ORAL_TABLET | Freq: Four times a day (QID) | ORAL | Status: DC

## 2016-02-14 MED ORDER — LORAZEPAM 2 MG PO TABS: 0.0000 mg | ORAL_TABLET | Freq: Two times a day (BID) | ORAL | Status: DC

## 2016-02-14 MED ORDER — LORAZEPAM 2 MG/ML IJ SOLN: 0.0000 mg | Freq: Two times a day (BID) | INTRAMUSCULAR | Status: DC

## 2016-02-14 MED ORDER — ONDANSETRON HCL 4 MG/2ML IJ SOLN
4.0000 mg | Freq: Once | INTRAMUSCULAR | Status: AC
  Administered 2016-02-14: 4 mg via INTRAVENOUS

## 2016-02-14 NOTE — BH Assessment (Signed)
Writer discussed with the patient about the [facility]. Patient was in the agreement with the plan. Patient signed the Release of information, in order to send labs and ER Note. Information sent sent to [facility] via fax and confirmed it was received.

## 2016-02-14 NOTE — ED Provider Notes (Signed)
[facility] Emergency Department Provider Note  ____________________________________________  Time seen: Approximately 8;45 AM  I have reviewed the triage vital signs and the nursing notes.   HISTORY  Chief Complaint Suicidal    HPI Patty Cruz is a 60 y.o. female who is complaining that she has been drinking heavily and over the past couple nights she has just not wanting to live anymore. Patient states that she did not have any specific plan. Patient is requesting detox from her alcohol at this time as well. Patient denies any recent illnesses, cough or cold symptoms, headache, dizziness, nausea, vomiting, change in bowels, or any other symptoms. Patient also denies any other drug use.   Past Medical History  Diagnosis Date  . Alcohol abuse     Patient Active Problem List   Diagnosis Date Noted  . Nausea 10/17/2015  . Acute alcohol intoxication (HCC)   . Alcohol dependence with alcohol-induced mood disorder (HCC) 12/28/2014  . Alcohol dependence with uncomplicated withdrawal (HCC) 12/27/2014  . Recurrent major depression-severe (HCC) 12/27/2014    Past Surgical History  Procedure Laterality Date  . Cesarean section  1991  . Wrist surgery Right     Current Outpatient Rx  Name  Route  Sig  Dispense  Refill  . acamprosate (CAMPRAL) 333 MG tablet   Oral   Take 2 tablets (666 mg total) by mouth 3 (three) times daily with meals. Patient not taking: Reported on 09/24/2015   180 tablet   0   . promethazine (PHENERGAN) 25 MG suppository   Rectal   Place 1 suppository (25 mg total) rectally every 6 (six) hours as needed for nausea or vomiting.   12 each   1     Please delivered to patient's house [address] [facility] ...   . venlafaxine XR (EFFEXOR-XR) 150 MG 24 hr capsule   Oral   Take 1 capsule (150 mg total) by mouth daily with breakfast. Patient not taking: Reported on 09/24/2015   30 capsule   0     Allergies Codeine; Tramadol; and  Trazodone and nefazodone  No family history on file.  Social History Social History  Substance Use Topics  . Smoking status: Never Smoker   . Smokeless tobacco: Never Used  . Alcohol Use: Yes    Review of Systems Constitutional: No fever/chills  Pt has been drinking heavily Eyes: No visual changes. ENT: No sore throat. Cardiovascular: Denies chest pain. Respiratory: Denies shortness of breath. Gastrointestinal: No abdominal pain.  No nausea, no vomiting.  No diarrhea.  No constipation. Genitourinary: Negative for dysuria. Musculoskeletal: Negative for back pain. Skin: Negative for rash. Neurological: Negative for headaches, focal weakness or numbness. Psychological:  Pt with depression and SI but no plan.   10-point ROS otherwise negative.  ____________________________________________   PHYSICAL EXAM:  VITAL SIGNS: ED Triage Vitals  Enc Vitals Group     BP 02/14/16 0802 109/70 mmHg     Pulse Rate 02/14/16 0802 95     Resp 02/14/16 0802 18     Temp 02/14/16 0802 98.2 F (36.8 C)     Temp Source 02/14/16 0802 Oral     SpO2 02/14/16 0802 100 %     Weight 02/14/16 0802 120 lb (54.432 kg)     Height 02/14/16 0802  (1.676 m)     Head Cir --      Peak Flow --      Pain Score --      Pain Loc --  Pain Edu? --      Excl. in GC? --     Constitutional: Alert and oriented. Well appearing and in no acute distress.Patient has been drinking heavily. Positive EtOH metabolite on the breath. Eyes: Conjunctivae are normal. PERRL. EOMI. Head: Atraumatic. Nose: No congestion/rhinnorhea. Mouth/Throat: Mucous membranes are moist.  Oropharynx non-erythematous. Neck: No stridor.   Cardiovascular: Normal rate, regular rhythm. Grossly normal heart sounds.  Good peripheral circulation. Respiratory: Normal respiratory effort.  No retractions. Lungs CTAB. Gastrointestinal: Soft and nontender. No distention. No abdominal bruits. No CVA tenderness. Musculoskeletal: No lower  extremity tenderness nor edema.  No joint effusions. Neurologic:  Normal speech and language. No gross focal neurologic deficits are appreciated. No gait instability. Skin:  Skin is warm, dry and intact. No rash noted. Psychiatric: Mood and affect are depressed but patient is obviously intoxicated. Speech and behavior are somber secondary to intoxication.  ____________________________________________   LABS (all labs ordered are listed, but only abnormal results are displayed)  Labs Reviewed  COMPREHENSIVE METABOLIC PANEL - Abnormal; Notable for the following:    Sodium 134 (*)    Chloride 94 (*)    Glucose, Bld 106 (*)    Calcium 8.8 (*)    All other components within normal limits  ETHANOL - Abnormal; Notable for the following:    Alcohol, Ethyl (B) 393 (*)    All other components within normal limits  ACETAMINOPHEN LEVEL - Abnormal; Notable for the following:    Acetaminophen (Tylenol), Serum <10 (*)    All other components within normal limits  SALICYLATE LEVEL  CBC  URINE DRUG SCREEN, QUALITATIVE ([facility] ONLY)   ____________________________________________  EKG  none ____________________________________________  RADIOLOGY  none ____________________________________________   PROCEDURES  Procedure(s) performed: None  Critical Care performed: No  ____________________________________________   INITIAL IMPRESSION / ASSESSMENT AND PLAN / ED COURSE  Pertinent labs & imaging results that were available during my care of the patient were reviewed by me and considered in my medical decision making (see chart for details).  1:37 PM Pt will be given 2 liters of NS as well as IV thiamine at this time.  Pt will be referred for psych assessment. 1:37 PM Patient will be signed out to Dr. Glenetta HewMcLaurin at 3 PM.  ____________________________________________   FINAL CLINICAL IMPRESSION(S) / ED DIAGNOSES  Final diagnoses:  Depression  Alcohol abuse  Alcohol intoxication,  uncomplicated (HCC)  Suicidal ideation      Leona CarryLinda M Mackie Goon, MD 02/14/16 1337

## 2016-02-14 NOTE — ED Notes (Signed)
Pt clothing and jewelry removed. Pt dressed in paper scrubs. Pt blood drawn and urine sample received. Pt roomed and belongings given to Amy, Charity fundraiserN.

## 2016-02-14 NOTE — ED Notes (Addendum)
Pt brought into ED by [employer]. Pt reports her brother called the [employer] because she expressed thoughts of wanting to hurt herself. Pt states she is an alcoholic, pt reports last alcohol use was this morning. Pt denies having a suicide plan. Pt states lives alone and house is being demolished in 14 days. Pt became angry when questioned. Pt tearful in triage and refusing to answer questions. Pt states she is here voluntarily.

## 2016-02-14 NOTE — ED Notes (Signed)
Lunch provided along with an extra drink   

## 2016-02-14 NOTE — ED Notes (Signed)
She has ambulated with a steady gait to the BR

## 2016-02-14 NOTE — Discharge Instructions (Signed)
Proceed directly to [facility] for treatment. Return to the emergency department for thoughts of wanting to hurt herself or for any other concerns.   Alcohol Use Disorder Alcohol use disorder is a mental disorder. It is not a one-time incident of heavy drinking. Alcohol use disorder is the excessive and uncontrollable use of alcohol over time that leads to problems with functioning in one or more areas of daily living. People with this disorder risk harming themselves and others when they drink to excess. Alcohol use disorder also can cause other mental disorders, such as mood and anxiety disorders, and serious physical problems. People with alcohol use disorder often misuse other drugs.  Alcohol use disorder is common and widespread. Some people with this disorder drink alcohol to cope with or escape from negative life events. Others drink to relieve chronic pain or symptoms of mental illness. People with a family history of alcohol use disorder are at higher risk of losing control and using alcohol to excess.  Drinking too much alcohol can cause injury, accidents, and health problems. One drink can be too much when you are:  Working.  Pregnant or breastfeeding.  Taking medicines. Ask your doctor.  Driving or planning to drive. SYMPTOMS  Signs and symptoms of alcohol use disorder may include the following:   Consumption ofalcohol inlarger amounts or over a longer period of time than intended.  Multiple unsuccessful attempts to cutdown or control alcohol use.   A great deal of time spent obtaining alcohol, using alcohol, or recovering from the effects of alcohol (hangover).  A strong desire or urge to use alcohol (cravings).   Continued use of alcohol despite problems at work, school, or home because of alcohol use.   Continued use of alcohol despite problems in relationships because of alcohol use.  Continued use of alcohol in situations when it is physically hazardous, such as driving a  car.  Continued use of alcohol despite awareness of a physical or psychological problem that is likely related to alcohol use. Physical problems related to alcohol use can involve the brain, heart, liver, stomach, and intestines. Psychological problems related to alcohol use include intoxication, depression, anxiety, psychosis, delirium, and dementia.   The need for increased amounts of alcohol to achieve the same desired effect, or a decreased effect from the consumption of the same amount of alcohol (tolerance).  Withdrawal symptoms upon reducing or stopping alcohol use, or alcohol use to reduce or avoid withdrawal symptoms. Withdrawal symptoms include:  Racing heart.  Hand tremor.  Difficulty sleeping.  Nausea.  Vomiting.  Hallucinations.  Restlessness.  Seizures. DIAGNOSIS Alcohol use disorder is diagnosed through an assessment by your health care provider. Your health care provider may start by asking three or four questions to screen for excessive or problematic alcohol use. To confirm a diagnosis of alcohol use disorder, at least two symptoms must be present within a 6769-month period. The severity of alcohol use disorder depends on the number of symptoms:  Mild--two or three.  Moderate--four or five.  Severe--six or more. Your health care provider may perform a physical exam or use results from lab tests to see if you have physical problems resulting from alcohol use. Your health care provider may refer you to a mental health professional for evaluation. TREATMENT  Some people with alcohol use disorder are able to reduce their alcohol use to low-risk levels. Some people with alcohol use disorder need to quit drinking alcohol. When necessary, mental health professionals with specialized training in substance use  treatment can help. Your health care provider can help you decide how severe your alcohol use disorder is and what type of treatment you need. The following forms of  treatment are available:   Detoxification. Detoxification involves the use of prescription medicines to prevent alcohol withdrawal symptoms in the first week after quitting. This is important for people with a history of symptoms of withdrawal and for heavy drinkers who are likely to have withdrawal symptoms. Alcohol withdrawal can be dangerous and, in severe cases, cause death. Detoxification is usually provided in a hospital or in-patient substance use treatment facility.  Counseling or talk therapy. Talk therapy is provided by substance use treatment counselors. It addresses the reasons people use alcohol and ways to keep them from drinking again. The goals of talk therapy are to help people with alcohol use disorder find healthy activities and ways to cope with life stress, to identify and avoid triggers for alcohol use, and to handle cravings, which can cause relapse.  Medicines.Different medicines can help treat alcohol use disorder through the following actions:  Decrease alcohol cravings.  Decrease the positive reward response felt from alcohol use.  Produce an uncomfortable physical reaction when alcohol is used (aversion therapy).  Support groups. Support groups are run by people who have quit drinking. They provide emotional support, advice, and guidance. These forms of treatment are often combined. Some people with alcohol use disorder benefit from intensive combination treatment provided by specialized substance use treatment centers. Both inpatient and outpatient treatment programs are available.   This information is not intended to replace advice given to you by your health care provider. Make sure you discuss any questions you have with your health care provider.   Document Released: 12/05/2004 Document Revised: 11/18/2014 Document Reviewed: 02/04/2013 Elsevier Interactive Patient Education [employer]

## 2016-02-14 NOTE — ED Provider Notes (Signed)
10:25 AM 02/14/2016  Patient had an EKG and chest x-ray ordered so [facility] ECG REPORT I, Leona Carryaylor,  Khadijah Mastrianni M, the attending physician, personally viewed and interpreted this ECG.   Date: 02/14/2016  EKG Time: 1022  Rate: 109  Rhythm: sinus tachycardia  Axis: Left axis deviation, left atrial enlargement  Intervals:right bundle branch block  ST&T Change: Nonspecific ST changes and flipped T waves anteriorly with right bundle branch block.  Chest x-ray was unremarkable for any acute findings.   Leona CarryLinda M Wania Longstreth, MD 02/14/16 1026

## 2016-02-14 NOTE — ED Notes (Signed)
BEHAVIORAL HEALTH ROUNDING Patient sleeping: No. Patient alert and oriented: yes Behavior appropriate: Yes.  ; If no, describe:  Nutrition and fluids offered: yes Toileting and hygiene offered: Yes  Sitter present: q15 minute observations and security  monitoring Law enforcement present: Yes  ODS  

## 2016-02-14 NOTE — BH Assessment (Signed)
Assessment Note  Patty Cruz is an 60 y.o. female who presents to the ER seeking assistance with alcohol detox. She states she has used ongoing for several years. The longest she's been sober is 2 year. Current symptoms of withdrawal are; shakes, some nausea and some dizziness. She denies having history of seizures and blackouts.  Patient has had multiple visit to the ER for similar presentation but was in denial of her alcohol use being a problem. With this visit, the patient is requesting treatment. Per the report of the patient, her brother has talked with detox facility and is working on getting her admitted with them.  Patient denies SI/HI and AV/H. She reports of having no involvement with the legal system and no upcoming courts dates. She have no history of violence or aggression.   Diagnosis: Alcohol Use Disorder; Severe  Past Medical History:  Past Medical History  Diagnosis Date  . Alcohol abuse     Past Surgical History  Procedure Laterality Date  . Cesarean section  1991  . Wrist surgery Right     Family History: No family history on file.  Social History:  reports that she has never smoked. She has never used smokeless tobacco. She reports that she drinks alcohol. She reports that she does not use illicit drugs.  Additional Social History:  Alcohol / Drug Use Pain Medications: See PTA Prescriptions: See PTA Over the Counter: See PTA History of alcohol / drug use?: Yes Longest period of sobriety (when/how long): 2 years Negative Consequences of Use: Financial, Personal relationships, Work / School Substance #1 Name of Substance 1: Alcohol 1 - Age of First Use: Early 20's. Start abuing it in her early 30s 1 - Amount (size/oz): "Half a gallon of [place]" 1 - Frequency: "I started back drinking on Saturday (02/10/2016)" 1 - Duration: "Since Saturday" 1 - Last Use / Amount: 02/14/2016  CIWA: CIWA-Ar BP: 109/70 mmHg Pulse Rate: 95 Nausea and Vomiting: mild nausea  with no vomiting Tactile Disturbances: none Tremor: not visible, but can be felt fingertip to fingertip Auditory Disturbances: not present Paroxysmal Sweats: no sweat visible Visual Disturbances: not present Anxiety: mildly anxious Headache, Fullness in Head: very mild Agitation: normal activity Orientation and Clouding of Sensorium: oriented and can do serial additions CIWA-Ar Total: 4 COWS:    Allergies:  Allergies  Allergen Reactions  . Codeine Nausea And Vomiting  . Tramadol Nausea Only  . Trazodone And Nefazodone Nausea And Vomiting    Home Medications:  (Not in a hospital admission)  OB/GYN Status:  No LMP recorded. Patient is postmenopausal.  General Assessment Data Location of Assessment: [facility] ED [facility] Assessment: In system Is this a Tele or Face-to-Face Assessment?: Face-to-Face Is this an Initial Assessment or a Re-assessment for this encounter?: Initial Assessment Marital status: Divorced [place] name: [place] Is patient pregnant?: No Pregnancy Status: No Living Arrangements: Alone Can pt return to current living arrangement?: Yes Admission Status: Voluntary Is patient capable of signing voluntary admission?: Yes Referral Source: Self/Family/Friend Insurance type: None  Medical Screening Exam ([facility] Walk-in ONLY) Medical Exam completed: Yes  Crisis Care Plan Living Arrangements: Alone Legal Guardian: Other: (None) Name of Psychiatrist: Reports of none Name of Therapist: Reports of none  Education Status Is patient currently in school?: No Current Grade: n/a Highest grade of school patient has completed: Bachelor's Degree Name of school: [employer] person: n/a  Risk to self with the past 6 months Suicidal Ideation: No Has patient been a risk to  self within the past 6 months prior to admission? : No Suicidal Intent: No Has patient had any suicidal intent within the past 6 months prior to admission? : No Is patient at risk for suicide?:  No Suicidal Plan?: No Has patient had any suicidal plan within the past 6 months prior to admission? : No Access to Means: No What has been your use of drugs/alcohol within the last 12 months?: Alcohol Previous Attempts/Gestures: No How many times?: 0 Other Self Harm Risks: Reports of none Triggers for Past Attempts: None known Intentional Self Injurious Behavior: None Family Suicide History: No Recent stressful life event(s): Other (Comment), Financial Problems (Active Addiction) Persecutory voices/beliefs?: No Depression: Yes Depression Symptoms: Feeling worthless/self pity, Loss of interest in usual pleasures, Guilt, Isolating, Fatigue Substance abuse history and/or treatment for substance abuse?: Yes Suicide prevention information given to non-admitted patients: Not applicable  Risk to Others within the past 6 months Homicidal Ideation: No Does patient have any lifetime risk of violence toward others beyond the six months prior to admission? : No Thoughts of Harm to Others: No Current Homicidal Intent: No Current Homicidal Plan: No Access to Homicidal Means: No Identified Victim: Reports of none History of harm to others?: No Assessment of Violence: None Noted Violent Behavior Description: Reports of none Does patient have access to weapons?: No Criminal Charges Pending?: No Does patient have a court date: No Is patient on probation?: No  Psychosis Hallucinations: None noted Delusions: None noted  Mental Status Report Appearance/Hygiene: In hospital gown, In scrubs, Unremarkable Eye Contact: Good Motor Activity: Freedom of movement, Unremarkable Speech: Logical/coherent, Soft Level of Consciousness: Alert Mood: Anxious, Depressed, Sad, Pleasant Affect: Appropriate to circumstance, Sad Anxiety Level: Minimal Thought Processes: Coherent, Relevant Judgement: Unimpaired Orientation: Person, Place, Time, Situation, Appropriate for developmental age Obsessive  Compulsive Thoughts/Behaviors: Minimal  Cognitive Functioning Concentration: Normal Memory: Remote Intact, Recent Intact IQ: Average Insight: Poor Impulse Control: Poor Appetite: Good Weight Loss: 0 Weight Gain: 0 Sleep: No Change Total Hours of Sleep: 8 Vegetative Symptoms: None  ADLScreening [facility] Assessment Services) Patient's cognitive ability adequate to safely complete daily activities?: Yes Patient able to express need for assistance with ADLs?: Yes Independently performs ADLs?: Yes (appropriate for developmental age)  Prior Inpatient Therapy Prior Inpatient Therapy: Yes Prior Therapy Dates: 11/2015, 01/2015, 12/2014, 06/2014, 11/2013, 04/2009 [facility] Reason for Treatment: Depression and Alcohol Use  Prior Outpatient Therapy Prior Outpatient Therapy: Yes Prior Therapy Dates: 2010 [facility] Reason for Treatment: Alcohol Use Does patient have an ACCT team?: No Does patient have Intensive In-House Services?  : No Does patient have [facility] services? : No Does patient have P4CC services?: No  ADL Screening (condition at time of admission) Patient's cognitive ability adequate to safely complete daily activities?: Yes Is the patient deaf or have difficulty hearing?: No Does the patient have difficulty seeing, even when wearing glasses/contacts?: No Does the patient have difficulty concentrating, remembering, or making decisions?: No Patient able to express need for assistance with ADLs?: Yes Does the patient have difficulty dressing or bathing?: No Independently performs ADLs?: Yes (appropriate for developmental age) Does the patient have difficulty walking or climbing stairs?: No Weakness of Legs: None Weakness of Arms/Hands: None  Home Assistive Devices/Equipment Home Assistive Devices/Equipment: None  Therapy Consults (therapy  consults require a physician order) PT Evaluation Needed: No OT Evalulation Needed: No SLP Evaluation Needed: No Abuse/Neglect Assessment (Assessment to be  complete while patient is alone) Physical Abuse: Denies Verbal Abuse: Denies Sexual Abuse: Denies Exploitation of patient/patient's resources: Denies Self-Neglect: Denies Values / Beliefs Cultural Requests During Hospitalization: None Spiritual Requests During Hospitalization: None Consults Spiritual Care Consult Needed: No Social Work Consult Needed: No      Additional Information 1:1 In Past 12 Months?: No CIRT Risk: No Elopement Risk: No Does patient have medical clearance?: Yes  Child/Adolescent Assessment Running Away Risk: Denies (Patient is an adult)  Disposition:  Disposition Initial Assessment Completed for this Encounter: Yes Disposition of Patient: Referred to Type of outpatient treatment: Adult Patient referred to: [facility], Other (Comment) Facilities manager)  On Site Evaluation by:   Reviewed with Physician:    Lilyan Gilford MS, LCAS, LPC, NCC, CCSI Therapeutic Triage Specialist 02/14/2016 1:13 PM

## 2016-02-14 NOTE — ED Notes (Signed)
BEHAVIORAL HEALTH ROUNDING Patient sleeping: Yes.   Patient alert and oriented: eyes closed  Appears asleep Behavior appropriate: Yes.  ; If no, describe:  Nutrition and fluids offered: Yes  Toileting and hygiene offered: sleeping Sitter present: q 15 minute observations and security monitoring Law enforcement present: yes  ODS 

## 2016-02-14 NOTE — Progress Notes (Signed)
[facility]  Bed should be available later today. Accepted to the service of Dr. Dub MikesLugo, MD.  Rosey BathKelly Cynethia Schindler, RN

## 2016-02-14 NOTE — ED Notes (Signed)
TTS Jerilynn Som- Calvin consulting at this time

## 2016-02-14 NOTE — ED Provider Notes (Signed)
-----------------------------------------   4:52 PM on 02/14/2016 -----------------------------------------  Patient has been seen and evaluated by psychiatry. She has been accepted for a bed at residential treatment services. She is felt to be stable for discharge for outpatient treatment.  Filed Vitals:   02/14/16 0802 02/14/16 1200  BP: 109/70 109/70  Pulse: 95   Temp: 98.2 F (36.8 C)   Resp: 18      Maurilio LovelyNoelle Rendon Howell, MD 02/14/16 1652

## 2016-02-14 NOTE — ED Notes (Signed)

## 2016-03-29 ENCOUNTER — Emergency Department
Admission: EM | Admit: 2016-03-29 | Discharge: 2016-03-30 | Disposition: A | Discharge status: Home / Self Care | Payer: Self-pay | Attending: Emergency Medicine | Admitting: Emergency Medicine

## 2016-03-29 ENCOUNTER — Encounter: Payer: Self-pay | Admitting: Emergency Medicine

## 2016-03-29 DIAGNOSIS — F329 Major depressive disorder, single episode, unspecified: Secondary | ICD-10-CM

## 2016-03-29 DIAGNOSIS — F32A Depression, unspecified: Secondary | ICD-10-CM

## 2016-03-29 DIAGNOSIS — F10230 Alcohol dependence with withdrawal, uncomplicated: Secondary | ICD-10-CM | POA: Insufficient documentation

## 2016-03-29 DIAGNOSIS — Z79899 Other long term (current) drug therapy: Secondary | ICD-10-CM | POA: Insufficient documentation

## 2016-03-29 DIAGNOSIS — F1024 Alcohol dependence with alcohol-induced mood disorder: Secondary | ICD-10-CM | POA: Insufficient documentation

## 2016-03-29 DIAGNOSIS — F332 Major depressive disorder, recurrent severe without psychotic features: Secondary | ICD-10-CM | POA: Insufficient documentation

## 2016-03-29 DIAGNOSIS — F1010 Alcohol abuse, uncomplicated: Secondary | ICD-10-CM

## 2016-03-29 LAB — URINE DRUG SCREEN, QUALITATIVE (ARMC ONLY)
Amphetamines, Ur Screen: NOT DETECTED
BENZODIAZEPINE, UR SCRN: NOT DETECTED
Barbiturates, Ur Screen: NOT DETECTED
CANNABINOID 50 NG, UR ~~LOC~~: NOT DETECTED
Cocaine Metabolite,Ur ~~LOC~~: NOT DETECTED
MDMA (Ecstasy)Ur Screen: NOT DETECTED
Methadone Scn, Ur: NOT DETECTED
OPIATE, UR SCREEN: NOT DETECTED
PHENCYCLIDINE (PCP) UR S: NOT DETECTED
Tricyclic, Ur Screen: NOT DETECTED

## 2016-03-29 LAB — CBC WITH DIFFERENTIAL/PLATELET
BASOS ABS: 0 10*3/uL (ref 0–0.1)
BASOS PCT: 1 %
Eosinophils Absolute: 0.1 10*3/uL (ref 0–0.7)
Eosinophils Relative: 2 %
HEMATOCRIT: 45.4 % (ref 35.0–47.0)
HEMOGLOBIN: 15.4 g/dL (ref 12.0–16.0)
LYMPHS PCT: 34 %
Lymphs Abs: 1.8 10*3/uL (ref 1.0–3.6)
MCH: 32.6 pg (ref 26.0–34.0)
MCHC: 33.9 g/dL (ref 32.0–36.0)
MCV: 96.3 fL (ref 80.0–100.0)
MONOS PCT: 6 %
Monocytes Absolute: 0.3 10*3/uL (ref 0.2–0.9)
NEUTROS ABS: 3.1 10*3/uL (ref 1.4–6.5)
NEUTROS PCT: 57 %
Platelets: 321 10*3/uL (ref 150–440)
RBC: 4.71 MIL/uL (ref 3.80–5.20)
RDW: 12.8 % (ref 11.5–14.5)
WBC: 5.4 10*3/uL (ref 3.6–11.0)

## 2016-03-29 LAB — COMPREHENSIVE METABOLIC PANEL
ALBUMIN: 4.7 g/dL (ref 3.5–5.0)
ALT: 20 U/L (ref 14–54)
AST: 29 U/L (ref 15–41)
Alkaline Phosphatase: 95 U/L (ref 38–126)
Anion gap: 11 (ref 5–15)
BUN: 7 mg/dL (ref 6–20)
CHLORIDE: 103 mmol/L (ref 101–111)
CO2: 26 mmol/L (ref 22–32)
Calcium: 9 mg/dL (ref 8.9–10.3)
Creatinine, Ser: 0.71 mg/dL (ref 0.44–1.00)
GFR calc Af Amer: >60 mL/min (ref >60)
GFR calc non Af Amer: >60 mL/min (ref >60)
GLUCOSE: 93 mg/dL (ref 65–99)
POTASSIUM: 4 mmol/L (ref 3.5–5.1)
SODIUM: 140 mmol/L (ref 135–145)
Total Bilirubin: 0.5 mg/dL (ref 0.3–1.2)
Total Protein: 7.9 g/dL (ref 6.5–8.1)

## 2016-03-29 LAB — ACETAMINOPHEN LEVEL

## 2016-03-29 LAB — SALICYLATE LEVEL: Salicylate Lvl: <4 mg/dL (ref 2.8–30.0)

## 2016-03-29 LAB — URINALYSIS COMPLETE WITH MICROSCOPIC (ARMC ONLY)
BILIRUBIN URINE: NEGATIVE
Bacteria, UA: NONE SEEN
Glucose, UA: NEGATIVE mg/dL
KETONES UR: NEGATIVE mg/dL
LEUKOCYTES UA: NEGATIVE
NITRITE: NEGATIVE
PH: 6 (ref 5.0–8.0)
PROTEIN: NEGATIVE mg/dL
SPECIFIC GRAVITY, URINE: 1.011 (ref 1.005–1.030)

## 2016-03-29 LAB — ETHANOL: Alcohol, Ethyl (B): 268 mg/dL — ABNORMAL HIGH (ref <5)

## 2016-03-29 MED ORDER — ONDANSETRON HCL 4 MG/2ML IJ SOLN
4.0000 mg | Freq: Once | INTRAMUSCULAR | Status: AC
  Administered 2016-03-29: 4 mg via INTRAVENOUS

## 2016-03-29 MED ORDER — THIAMINE HCL 100 MG/ML IJ SOLN
100.0000 mg | Freq: Every day | INTRAMUSCULAR | Status: DC
  Administered 2016-03-29: 100 mg via INTRAVENOUS
  Filled 2016-03-29: qty 2

## 2016-03-29 MED ORDER — LORAZEPAM 2 MG/ML IJ SOLN
2.0000 mg | Freq: Once | INTRAMUSCULAR | Status: AC
  Administered 2016-03-29: 2 mg via INTRAVENOUS
  Filled 2016-03-29: qty 1

## 2016-03-29 MED ORDER — SODIUM CHLORIDE 0.9 % IV BOLUS (SEPSIS)
1000.0000 mL | Freq: Once | INTRAVENOUS | Status: AC
  Administered 2016-03-29: 1000 mL via INTRAVENOUS

## 2016-03-29 MED ORDER — ONDANSETRON HCL 4 MG/2ML IJ SOLN
INTRAMUSCULAR | Status: AC
  Filled 2016-03-29: qty 2

## 2016-03-29 MED ORDER — SODIUM CHLORIDE 0.9 % IV SOLN
1.0000 mg | Freq: Once | INTRAVENOUS | Status: AC
  Administered 2016-03-29: 1 mg via INTRAVENOUS
  Filled 2016-03-29: qty 0.2

## 2016-03-29 MED ORDER — LORAZEPAM 2 MG/ML IJ SOLN: 1.0000 mg | INTRAMUSCULAR | Status: DC | PRN

## 2016-03-29 NOTE — ED Notes (Addendum)
Pt sitting in hallway, became agitated stating to numerous RN's that "i'm just going to go home now so I can kill myself" Pt calmed down, MD Mcshane at bedside at this time talking to pt. Pt refusing to stay. Pt educated on IVC and protocols. Pt verbalizing understanding at this time. Security at bedside with pt. Pt's husband at bedside educated as well and verbalized understanding.

## 2016-03-29 NOTE — ED Notes (Addendum)
Pt being dressed out at this time by ED tech and RN. Pt tolerating well, NAD noted.  Belongings include: one necklace, 1 phone, wallet and purse. Shirt and pants, 2 shoes.

## 2016-03-29 NOTE — ED Provider Notes (Signed)
----------------------------------------- 4:47 PM on 03/29/2016 -----------------------------------------  [facility] Emergency Department Provider Note  ____________________________________________   I have reviewed the triage vital signs and the nursing notes.   HISTORY  Chief Complaint Alcohol Problem    HPI Patty Cruz is a 60 y.o. female with a history of alcohol abuse and depression states that she wants to "go home and die". She ate states that she made a suicide note of the days ago. She is tired of being a "drunk". She wants to go home and drink until she is dead. She denies taking any overdose at this time she does state that she has been drinking today denies history of significant withdrawal symptoms or seizure in the past.She does not have a specific plan about how she will kill herself but she wants to drink herself to death she states. She states she has done being alive.     Past Medical History  Diagnosis Date  . Alcohol abuse     Patient Active Problem List   Diagnosis Date Noted  . Nausea 10/17/2015  . Acute alcohol intoxication (HCC)   . Alcohol dependence with alcohol-induced mood disorder (HCC) 12/28/2014  . Alcohol dependence with uncomplicated withdrawal (HCC) 12/27/2014  . Recurrent major depression-severe (HCC) 12/27/2014    Past Surgical History  Procedure Laterality Date  . Cesarean section  1991  . Wrist surgery Right     Current Outpatient Rx  Name  Route  Sig  Dispense  Refill  . acamprosate (CAMPRAL) 333 MG tablet   Oral   Take 2 tablets (666 mg total) by mouth 3 (three) times daily with meals. Patient not taking: Reported on 09/24/2015   180 tablet   0   . promethazine (PHENERGAN) 25 MG suppository   Rectal   Place 1 suppository (25 mg total) rectally every 6 (six) hours as needed for nausea or vomiting.   12 each   1     [facility] [address] [facility] [address] [facility] [address] [facility] ...   . venlafaxine XR  (EFFEXOR-XR) 150 MG 24 hr capsule   Oral   Take 1 capsule (150 mg total) by mouth daily with breakfast. Patient not taking: Reported on 09/24/2015   30 capsule   0     Allergies Codeine; Tramadol; and Trazodone and nefazodone  No family history on file.  Social History Social History  Substance Use Topics  . Smoking status: Never Smoker   . Smokeless tobacco: Never Used  . Alcohol Use: Yes    Review of Systems Constitutional: No fever/chills Eyes: No visual changes. ENT: No sore throat. No stiff neck no neck pain Cardiovascular: Denies chest pain. Respiratory: Denies shortness of breath. Gastrointestinal:   no vomiting.  No diarrhea.  No constipation. Genitourinary: Negative for dysuria. Musculoskeletal: Negative lower extremity swelling Skin: Negative for rash. Neurological: Negative for headaches, focal weakness or numbness. 10-point ROS otherwise negative.  ____________________________________________   PHYSICAL EXAM:  VITAL SIGNS: ED Triage Vitals  Enc Vitals Group     BP 03/29/16 1514 116/78 mmHg     Pulse Rate 03/29/16 1514 86     Resp 03/29/16 1514 16     Temp 03/29/16 1634 98.5 F (36.9 C)     Temp Source 03/29/16 1634 Oral     SpO2 03/29/16 1514 96 %     Weight 03/29/16 1514 110 lb (49.896 kg)     Height 03/29/16 1514 5\' 4"  (1.626 m)     Head Cir --  Peak Flow --      Pain Score 03/29/16 1523 0     Pain Loc --      Pain Edu? --      Excl. in GC? --     Constitutional: Alert and oriented. Very impassioned and upset but not medically toxic. Eyes: Conjunctivae are normal. PERRL. EOMI. Head: Atraumatic. Nose: No congestion/rhinnorhea. Mouth/Throat: Mucous membranes are moist.  Oropharynx non-erythematous. Neck: No stridor.   Nontender with no meningismus Cardiovascular: Normal rate, regular rhythm. Grossly normal heart sounds.  Good peripheral circulation. Respiratory: Normal respiratory effort.  No retractions. Lungs CTAB. Abdominal: Soft  and nontender. No distention. No guarding no rebound Back:  There is no focal tenderness or step off there is no midline tenderness there are no lesions noted. there is no CVA tenderness Musculoskeletal: No lower extremity tenderness. No joint effusions, no DVT signs strong distal pulses no edema Neurologic:  Normal speech and language. No gross focal neurologic deficits are appreciated.  Skin:  Skin is warm, dry and intact. No rash noted. Psychiatric: Mood and affect are his are depressed.  ____________________________________________   LABS (all labs ordered are listed, but only abnormal results are displayed)  Labs Reviewed  ACETAMINOPHEN LEVEL  COMPREHENSIVE METABOLIC PANEL  SALICYLATE LEVEL  CBC WITH DIFFERENTIAL/PLATELET  ETHANOL   ____________________________________________  EKG  I personally interpreted any EKGs ordered by me or triage  ____________________________________________  RADIOLOGY  I reviewed any imaging ordered by me or triage that were performed during my shift and, if possible, patient and/or family made aware of any abnormal findings. ____________________________________________   PROCEDURES  Procedure(s) performed: None  Critical Care performed: None  ____________________________________________   INITIAL IMPRESSION / ASSESSMENT AND PLAN / ED COURSE  Pertinent labs & imaging results that were available during my care of the patient were reviewed by me and considered in my medical decision making (see chart for details).  Patient here with suicidal ideation and intoxication. We will IVC her, give her Ativan and thiamine and folate and watch her closely. No evidence of withdrawal at this time she does appear somewhat intoxicated. ____________________________________________   FINAL CLINICAL IMPRESSION(S) / ED DIAGNOSES  Final diagnoses:  None      This chart was dictated using voice recognition software.  Despite best efforts to  proofread,  errors can occur which can change meaning.     Jeanmarie Plant, MD 03/29/16 [phone]

## 2016-03-29 NOTE — ED Notes (Signed)
MD Clapacs at bedside  

## 2016-03-29 NOTE — Consult Note (Signed)
[place] Psychiatry Consult   Reason for Consult:  Consult for 60 year old woman with a history of alcohol abuse and depression brought in under commitment Referring Physician:  McShane Patient Identification: Patty Cruz MRN:  [id number] Principal Diagnosis: Alcohol dependence with alcohol-induced mood disorder [facility]) Diagnosis:   Patient Active Problem List   Diagnosis Date Noted  . Nausea [R11.0] 10/17/2015  . Acute alcohol intoxication ([place]) [F10.129]   . Alcohol dependence with alcohol-induced mood disorder ([place]) [F10.24] 12/28/2014  . Alcohol dependence with uncomplicated withdrawal ([place]) [F10.230] 12/27/2014  . Recurrent major depression-severe ([place]) [F33.2] 12/27/2014    Total Time spent with patient: 45 minutes  Subjective:   Patty Cruz is a 60 y.o. female patient admitted with "it's because of alcohol".  HPI:  Patient interviewed chart reviewed labs reviewed. 60 year old woman history of alcohol dependence. She says that she started drinking again on Sunday. She's been drinking a couple of bottles of wine intermittently. Had a bottle of wine earlier today. Brother found out that she relapsed and insisted that she come to the hospital. Patient is reported to have made statements about wanting to die but she told me that she would deny saying it. She says that her mood is been down but not hopeless. Sleep intermittent. Doesn't eat well when she is drinking. Sure excuse for relapsing this time is that she had an unpleasant emotional run in with her daughter. She denies that she is using any other drugs currently. She was taking Antabuse and actually started drinking while still taking Antabuse but hasn't taken it in several days.  Social history: Lives by herself. Her siblings try to keep an eye on her. She has an adult daughter who she has a love-hate relationship with. 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  Medical  history: Patient has had consequences of alcohol abuse at times with a great deal of weight loss. Gained a little bit of weight since she was able to maintain some sobriety recently.  Substance abuse history: Long history of alcohol abuse. She denies that she's ever had seizures or DTs. I think she's had some rough shakes at times and gets very sad but I'm not sure that she's had full-blown DTs. She's had several hospitalizations for substance abuse    Past Psychiatric History: History of depression multiple hospitalizations and emergency room visits for depression and alcohol abuse. Has been treated with antidepressant medicines in the past and of been of some benefit.  Risk to Self: Is patient at risk for suicide?: No Risk to Others:   Prior Inpatient Therapy:   Prior Outpatient Therapy:    Past Medical History:  Past Medical History  Diagnosis Date  . Alcohol abuse     Past Surgical History  Procedure Laterality Date  . Cesarean section  1991  . Wrist surgery Right    Family History: No family history on file. Family Psychiatric  History: Family history positive for some depression Social History:  History  Alcohol Use  . Yes     History  Drug Use No    Social History   Social History  . Marital Status: Divorced    Spouse Name: N/A  . Number of Children: N/A  . Years of Education: N/A   Social History Main Topics  . Smoking status: Never Smoker   . Smokeless tobacco: Never Used  . Alcohol Use: Yes  . Drug Use: No  .  Sexual Activity: Not Asked   Other Topics Concern  . None   Social History Narrative   Additional Social History:    Allergies:   Allergies  Allergen Reactions  . Codeine Nausea And Vomiting  . Tramadol Nausea Only  . Trazodone And Nefazodone Nausea And Vomiting    Labs:  Results for orders placed or performed during the hospital encounter of 03/29/16 (from the past 48 hour(s))  Comprehensive metabolic panel     Status: None    Collection Time: 03/29/16  5:14 PM  Result Value Ref Range   Sodium 140 135 - 145 mmol/L   Potassium 4.0 3.5 - 5.1 mmol/L   Chloride 103 101 - 111 mmol/L   CO2 26 22 - 32 mmol/L   Glucose, Bld 93 65 - 99 mg/dL   BUN 7 6 - 20 mg/dL   Creatinine, Ser 0.71 0.44 - 1.00 mg/dL   Calcium 9.0 8.9 - 10.3 mg/dL   Total Protein 7.9 6.5 - 8.1 g/dL   Albumin 4.7 3.5 - 5.0 g/dL   AST 29 15 - 41 U/L   ALT 20 14 - 54 U/L   Alkaline Phosphatase 95 38 - 126 U/L   Total Bilirubin 0.5 0.3 - 1.2 mg/dL   GFR calc non Af Amer >60 >60 mL/min   GFR calc Af Amer >60 >60 mL/min    Comment: (NOTE) The eGFR has been calculated using the CKD EPI equation. This calculation has not been validated in all clinical situations. eGFR's persistently <60 mL/min signify possible Chronic Kidney Disease.    Anion gap 11 5 - 15  CBC with Differential     Status: None   Collection Time: 03/29/16  5:14 PM  Result Value Ref Range   WBC 5.4 3.6 - 11.0 K/uL   RBC 4.71 3.80 - 5.20 MIL/uL   Hemoglobin 15.4 12.0 - 16.0 g/dL   HCT 45.4 35.0 - 47.0 %   MCV 96.3 80.0 - 100.0 fL   MCH 32.6 26.0 - 34.0 pg   MCHC 33.9 32.0 - 36.0 g/dL   RDW 12.8 11.5 - 14.5 %   Platelets 321 150 - 440 K/uL   Neutrophils Relative % 57 %   Neutro Abs 3.1 1.4 - 6.5 K/uL   Lymphocytes Relative 34 %   Lymphs Abs 1.8 1.0 - 3.6 K/uL   Monocytes Relative 6 %   Monocytes Absolute 0.3 0.2 - 0.9 K/uL   Eosinophils Relative 2 %   Eosinophils Absolute 0.1 0 - 0.7 K/uL   Basophils Relative 1 %   Basophils Absolute 0.0 0 - 0.1 K/uL  Urinalysis complete, with microscopic     Status: Abnormal   Collection Time: 03/29/16  5:28 PM  Result Value Ref Range   Color, Urine YELLOW (A) YELLOW   APPearance CLEAR (A) CLEAR   Glucose, UA NEGATIVE NEGATIVE mg/dL   Bilirubin Urine NEGATIVE NEGATIVE   Ketones, ur NEGATIVE NEGATIVE mg/dL   Specific Gravity, Urine 1.011 1.005 - 1.030   Hgb urine dipstick 1+ (A) NEGATIVE   pH 6.0 5.0 - 8.0   Protein, ur NEGATIVE  NEGATIVE mg/dL   Nitrite NEGATIVE NEGATIVE   Leukocytes, UA NEGATIVE NEGATIVE   RBC / HPF 0-5 0 - 5 RBC/hpf   WBC, UA 0-5 0 - 5 WBC/hpf   Bacteria, UA NONE SEEN NONE SEEN   Squamous Epithelial / LPF 0-5 (A) NONE SEEN   Mucous PRESENT   Urine Drug Screen, Qualitative     Status: None  Collection Time: 03/29/16  5:28 PM  Result Value Ref Range   Tricyclic, Ur Screen NONE DETECTED NONE DETECTED   Amphetamines, Ur Screen NONE DETECTED NONE DETECTED   MDMA (Ecstasy)Ur Screen NONE DETECTED NONE DETECTED   Cocaine Metabolite,Ur [place] NONE DETECTED NONE DETECTED   Opiate, Ur Screen NONE DETECTED NONE DETECTED   Phencyclidine (PCP) Ur S NONE DETECTED NONE DETECTED   Cannabinoid 50 Ng, Ur [place] NONE DETECTED NONE DETECTED   Barbiturates, Ur Screen NONE DETECTED NONE DETECTED   Benzodiazepine, Ur Scrn NONE DETECTED NONE DETECTED   Methadone Scn, Ur NONE DETECTED NONE DETECTED    Comment: (NOTE) 267  Tricyclics, urine               Cutoff 1000 ng/mL 200  Amphetamines, urine             Cutoff 1000 ng/mL 300  MDMA (Ecstasy), urine           Cutoff 500 ng/mL 400  Cocaine Metabolite, urine       Cutoff 300 ng/mL 500  Opiate, urine                   Cutoff 300 ng/mL 600  Phencyclidine (PCP), urine      Cutoff 25 ng/mL 700  Cannabinoid, urine              Cutoff 50 ng/mL 800  Barbiturates, urine             Cutoff 200 ng/mL 900  Benzodiazepine, urine           Cutoff 200 ng/mL 1000 Methadone, urine                Cutoff 300 ng/mL 1100 1200 The urine drug screen provides only a preliminary, unconfirmed 1300 analytical test result and should not be used for non-medical 1400 purposes. Clinical consideration and professional judgment should 1500 be applied to any positive drug screen result due to possible 1600 interfering substances. A more specific alternate chemical method 1700 must be used in order to obtain a confirmed analytical result.  1800 Gas chromato graphy / mass spectrometry (GC/MS) is the  preferred 1900 confirmatory method.     Current Facility-Administered Medications  Medication Dose Route Frequency Provider Last Rate Last Dose  . LORazepam (ATIVAN) injection 1-2 mg  1-2 mg Intravenous Q1H PRN Schuyler Amor, MD      . thiamine (B-1) injection 100 mg  100 mg Intravenous Daily Schuyler Amor, MD   100 mg at 03/29/16 1722   Current Outpatient Prescriptions  Medication Sig Dispense Refill  . acamprosate (CAMPRAL) 333 MG tablet Take 2 tablets (666 mg total) by mouth 3 (three) times daily with meals. (Patient not taking: Reported on 09/24/2015) 180 tablet 0  . promethazine (PHENERGAN) 25 MG suppository Place 1 suppository (25 mg total) rectally every 6 (six) hours as needed for nausea or vomiting. 12 each 1  . venlafaxine XR (EFFEXOR-XR) 150 MG 24 hr capsule Take 1 capsule (150 mg total) by mouth daily with breakfast. (Patient not taking: Reported on 09/24/2015) 30 capsule 0    Musculoskeletal: Strength & Muscle Tone: within normal limits Gait & Station: unable to stand Patient leans: N/A  Psychiatric Specialty Exam: Review of Systems  Constitutional: Negative.   HENT: Negative.   Eyes: Negative.   Respiratory: Negative.   Cardiovascular: Negative.   Gastrointestinal: Negative.   Musculoskeletal: Negative.   Skin: Negative.   Neurological: Negative.   Psychiatric/Behavioral: Positive for  depression and substance abuse. Negative for suicidal ideas, hallucinations and memory loss. The patient is nervous/anxious and has insomnia.     Blood pressure 112/66, pulse 74, temperature 98.5 F (36.9 C), temperature source Oral, resp. rate 18, height '5\' 4"'$  (1.626 m), weight 49.896 kg (110 lb), SpO2 94 %.Body mass index is 18.87 kg/(m^2).  General Appearance: Casual  Eye Contact::  Minimal  Speech:  Slurred  Volume:  Decreased  Mood:  Depressed  Affect:  Constricted  Thought Process:  Goal Directed  Orientation:  Full (Time, Place, and Person)  Thought Content:   Negative  Suicidal Thoughts:  No  Homicidal Thoughts:  No  Memory:  Immediate;   Good Recent;   Fair Remote;   Fair  Judgement:  Impaired  Insight:  Shallow  Psychomotor Activity:  Decreased  Concentration:  Fair  Recall:  [employer] of Knowledge:Good  Language: Fair  Akathisia:  No  Handed:  Right  AIMS (if indicated):     Assets:  Communication Skills Desire for Improvement Financial Resources/Insurance Housing Resilience  ADL's:  Impaired  Cognition: WNL  Sleep:      Treatment Plan Summary: Daily contact with patient to assess and evaluate symptoms and progress in treatment, Medication management and Plan Patient is currently too intoxicated to be discharged immediately. Really needs to have at least a brief period of observation to make sure she sobers up to get some stability. She is refusing the idea of going to an observation bed. As a result we will have her go to the emergency room and stay here and get detoxed until she is sobered up at which point she can be reevaluated. No changed IVC for now. Patient is unhappy with the plan but it's not safe for her to go home as she is right now.  Disposition: Supportive therapy provided about ongoing stressors. Discussed crisis plan, support from social network, calling 911, coming to the Emergency Department, and calling Suicide Hotline.  Alethia Berthold, MD 03/29/2016 6:14 PM

## 2016-03-29 NOTE — BH Assessment (Signed)
Assessment Note  Patty Cruz is an 60 y.o. female presents to the ED with a history of alcohol abuse and depression.  She made statements to the EDP that she wants to "go home and die".  She states she is tired of being a "drunk" and  wants to go home and drink until she is dead. She denies taking any overdose at this time she does state that she has been drinking today. Pt reports no specific plan other than to drink herself to death.  Pt has had multiple visits to the ED for the same and is in denial of her alcohol use being a problem.  She has not made any attempts to follow up with a provider for substance abuse intensive outpatient therapy.  Diagnosis: Alcohol Detox  Past Medical History:  Past Medical History  Diagnosis Date  . Alcohol abuse     Past Surgical History  Procedure Laterality Date  . Cesarean section  1991  . Wrist surgery Right     Family History: No family history on file.  Social History:  reports that she has never smoked. She has never used smokeless tobacco. She reports that she drinks alcohol. She reports that she does not use illicit drugs.  Additional Social History:  Alcohol / Drug Use History of alcohol / drug use?: Yes Substance #1 Name of Substance 1: Alcohol 1 - Age of First Use: early 20's 1 - Amount (size/oz): 1/2 a gallon of wine 1 - Frequency: daily 1 - Duration: daily 1 - Last Use / Amount: 03/29/2016  CIWA: CIWA-Ar BP: 109/64 mmHg Pulse Rate: 89 Nausea and Vomiting: no nausea and no vomiting Tactile Disturbances: none Tremor: no tremor Auditory Disturbances: not present Paroxysmal Sweats: no sweat visible Visual Disturbances: not present Anxiety: no anxiety, at ease Headache, Fullness in Head: none present Agitation: normal activity Orientation and Clouding of Sensorium: oriented and can do serial additions CIWA-Ar Total: 0 COWS:    Allergies:  Allergies  Allergen Reactions  . Codeine Nausea And Vomiting  . Tramadol Nausea  Only  . Trazodone And Nefazodone Nausea And Vomiting    Home Medications:  (Not in a hospital admission)  OB/GYN Status:  No LMP recorded. Patient is postmenopausal.  General Assessment Data Location of Assessment: [facility] ED TTS Assessment: In system Is this a Tele or Face-to-Face Assessment?: Face-to-Face Is this an Initial Assessment or a Re-assessment for this encounter?: Initial Assessment Marital status: Divorced [place] name: [place] Is patient pregnant?: No Pregnancy Status: No Living Arrangements: Alone Can pt return to current living arrangement?: Yes Admission Status: Voluntary (made IVC after stating "going to leave so i can go home and kill myself" ) Is patient capable of signing voluntary admission?: Yes Referral Source: Self/Family/Friend Insurance type: None  Medical Screening Exam [facility] Walk-in ONLY) Medical Exam completed: Yes  Crisis Care Plan Living Arrangements: Alone Legal Guardian: Other: (Self) Name of Psychiatrist: Reports of none Name of Therapist: Reports of none  Education Status Is patient currently in school?: No Current Grade: N/A Highest grade of school patient has completed: Bachelor's Degree Name of school: [employer] person: n/a  Risk to self with the past 6 months Suicidal Ideation: No Has patient been a risk to self within the past 6 months prior to admission? : No Suicidal Intent: No Has patient had any suicidal intent within the past 6 months prior to admission? : No Is patient at risk for suicide?: No Suicidal Plan?: No Has patient had any  suicidal plan within the past 6 months prior to admission? : No Access to Means: No What has been your use of drugs/alcohol within the last 12 months?: Alcohol Previous Attempts/Gestures: No How many times?: 0 Other Self Harm Risks: None reported Triggers for Past Attempts: None known Intentional Self Injurious Behavior: None Family Suicide History: No Recent stressful life  event(s): Financial Problems Persecutory voices/beliefs?: No Depression: Yes Depression Symptoms: Feeling worthless/self pity, Loss of interest in usual pleasures, Isolating, Feeling angry/irritable Substance abuse history and/or treatment for substance abuse?: Yes Suicide prevention information given to non-admitted patients: Not applicable  Risk to Others within the past 6 months Homicidal Ideation: No Does patient have any lifetime risk of violence toward others beyond the six months prior to admission? : No Thoughts of Harm to Others: No Current Homicidal Intent: No Current Homicidal Plan: No Access to Homicidal Means: No Identified Victim: None reported History of harm to others?: No Assessment of Violence: None Noted Violent Behavior Description: None reported Does patient have access to weapons?: No Criminal Charges Pending?: No Does patient have a court date: No Is patient on probation?: No  Psychosis Hallucinations: None noted Delusions: None noted  Mental Status Report Appearance/Hygiene: In scrubs, Unremarkable Eye Contact: Fair Motor Activity: Agitation Speech: Logical/coherent, Soft, Slurred Level of Consciousness: Drowsy, Sedated Mood: Anxious Affect: Appropriate to circumstance, Sad Anxiety Level: Minimal Thought Processes: Coherent, Relevant Judgement: Partial Orientation: Person, Place, Time, Situation, Appropriate for developmental age Obsessive Compulsive Thoughts/Behaviors: None  Cognitive Functioning Concentration: Good Memory: Recent Intact, Remote Intact IQ: Average Insight: Poor Impulse Control: Poor Appetite: Good Weight Loss: 0 Weight Gain: 0 Sleep: No Change Total Hours of Sleep: 8 Vegetative Symptoms: None  ADLScreening [facility] Assessment Services) Patient's cognitive ability adequate to safely complete daily activities?: Yes Patient able to express need for assistance with ADLs?: Yes Independently performs ADLs?: Yes (appropriate for  developmental age)  Prior Inpatient Therapy Prior Inpatient Therapy: Yes Prior Therapy Dates: 11/2015, 01/2015, 12/2014, 06/2014, 11/2013, 04/2009 [facility] Reason for Treatment: Depression and Alcohol Use  Prior Outpatient Therapy Prior Outpatient Therapy: Yes Prior Therapy Dates: 2010 [facility] Reason for Treatment: Alcohol Use Does patient have an ACCT team?: No Does patient have Intensive In-House Services?  : No Does patient have [facility] services? : No Does patient have P4CC services?: No  ADL Screening (condition at time of admission) Patient's cognitive ability adequate to safely complete daily activities?: Yes Patient able to express need for assistance with ADLs?: Yes Independently performs ADLs?: Yes (appropriate for developmental age)       Abuse/Neglect Assessment (Assessment to be complete while patient is alone) Physical Abuse: Denies Verbal Abuse: Denies Sexual Abuse: Denies Exploitation of patient/patient's resources: Denies Self-Neglect: Denies Values / Beliefs Cultural Requests During Hospitalization: None Spiritual Requests During Hospitalization: None Consults Spiritual Care Consult Needed: No Social Work Consult Needed: No      Additional Information 1:1 In Past 12 Months?: No CIRT Risk: No Elopement Risk: No Does patient have medical clearance?: Yes     Disposition:  Disposition Initial Assessment Completed for this Encounter: Yes Disposition of Patient: Other dispositions Other disposition(s): Other (Comment) (To be re-evaluated after she sobers up) Patient referred to:  Facilities manager(Detox Facility)  On Site Evaluation by:   Reviewed with Physician:    Artist Beachoxana C Kasidee Voisin 03/29/2016 10:43 PM

## 2016-03-29 NOTE — ED Notes (Signed)
Pt given meal tray at this time, pt sitting up in bed eating and tolerating well. NAD noted.

## 2016-03-29 NOTE — ED Notes (Signed)
Patient to ER for detox from ETOH. Has spoken to [facility] already. States she just needs medical clearance. Patient reports she has been drinking a "small bottle" per day lately.

## 2016-03-29 NOTE — ED Notes (Signed)
Pharmacy called regarding folic acid, will send to ED momentarily

## 2016-03-29 NOTE — ED Notes (Addendum)
MD Mcshane at bedside. 

## 2016-03-29 NOTE — ED Notes (Addendum)
After asking what brought her in to the hospital she states  "she wants to go home and commit suicide. " MD made aware.

## 2016-03-30 DIAGNOSIS — F329 Major depressive disorder, single episode, unspecified: Secondary | ICD-10-CM

## 2016-03-30 MED ORDER — LORAZEPAM 2 MG PO TABS
0.0000 mg | ORAL_TABLET | Freq: Four times a day (QID) | ORAL | Status: DC
  Administered 2016-03-30: 1 mg via ORAL

## 2016-03-30 MED ORDER — ONDANSETRON HCL 4 MG PO TABS
ORAL_TABLET | ORAL | Status: AC
  Filled 2016-03-30: qty 1

## 2016-03-30 MED ORDER — VITAMIN B-1 100 MG PO TABS
100.0000 mg | ORAL_TABLET | Freq: Every day | ORAL | Status: DC
  Administered 2016-03-30: 100 mg via ORAL
  Filled 2016-03-30: qty 1

## 2016-03-30 MED ORDER — ONDANSETRON 8 MG PO TBDP
4.0000 mg | ORAL_TABLET | Freq: Once | ORAL | Status: AC
Start: 2016-03-30 — End: 2016-03-30
  Administered 2016-03-30: 4 mg via ORAL
  Filled 2016-03-30 (×2): qty 0.5

## 2016-03-30 MED ORDER — LORAZEPAM 1 MG PO TABS
ORAL_TABLET | ORAL | Status: AC
  Administered 2016-03-30: 1 mg via ORAL
  Filled 2016-03-30: qty 1

## 2016-03-30 NOTE — Progress Notes (Signed)
LCSW met with patient ( prior knowledge and support given in the BMU). Patient has had success since Jan-April 80 days sober and continues daily with her AA meetings. She has recently relocated and downsized and her emotional state of loss got the best of her and conflicts with her daughter lead her to drink again. She is very close to her brother and she will get her ANABUSE prescription filled. Patient reports she is not suicidal or homicidal and will follow up with [facility] program. She reports she has a sponsor with whom she is very close too and feels her support.  She will await a psychiatric consult and hopes to discharge today. No further needs at this time.  [employer] LCSW [phone]

## 2016-03-30 NOTE — ED Notes (Signed)
Pt will be discharged today. Maintained on 15 minute checks and observation by security camera for safety.

## 2016-03-30 NOTE — ED Notes (Signed)
Pt discharged to lobby to wait for a ride from her brother. All belongings returned to pt.

## 2016-03-30 NOTE — ED Provider Notes (Signed)
-----------------------------------------   7:36 AM on 03/30/2016 -----------------------------------------   Blood pressure 102/59, pulse 65, temperature 98.2 F (36.8 C), temperature source Oral, resp. rate 18, height 5\' 4"  (1.626 m), weight 110 lb (49.896 kg), SpO2 99 %.  The patient had no acute events since last update.  Patient was placed on oral CIWA protocol. Calm and cooperative at this time.  Disposition is pending per Psychiatry/Behavioral Medicine team recommendations.     Irean HongJade J Jae Skeet, MD 03/30/16 [phone]

## 2016-03-30 NOTE — ED Notes (Signed)
Pt going to call her brother for a ride home from hospital. Discharge paperwork has been reviewed. Maintained on 15 minute checks and observation by security camera for safety.

## 2016-03-30 NOTE — Consult Note (Signed)
[facility] Consult   Reason for Consult:  Follow up Consult for 60 year old woman with a history of alcohol abuse and depression  Referring Physician:  [place] Patient Identification: Patty Cruz MRN:  [id number] Principal Diagnosis: Alcohol dependence with alcohol-induced mood disorder [facility]) Diagnosis:   Patient Active Problem List   Diagnosis Date Noted  . Nausea [R11.0] 10/17/2015  . Acute alcohol intoxication ([place]) [F10.129]   . Alcohol dependence with alcohol-induced mood disorder ([place]) [F10.24] 12/28/2014  . Alcohol dependence with uncomplicated withdrawal ([place]) [F10.230] 12/27/2014  . Recurrent major depression-severe ([place]) [F33.2] 12/27/2014    Total Time spent with patient: 45 minutes  Subjective:   Patty Cruz is a 60 y.o. female patient admitted with "it's because of alcohol".  HPI:  Patient Is a 60 year old female with long history of alcohol dependence. She was evaluated in the emergency room. She reported that she has relapsed on alcohol on Sunday and has been drinking a couple of bottles of wine intermittently. Patient reported that her brother found her that she has relapsed and brought her to the hospital. Patient reported that she has told him that she wanted to die but currently she denied having any suicidal ideations or plans. She reported that she is not having any withdrawal symptoms. She reported that she has been taking Antabuse in the past which was prescribed by her primary care physician. She was also going to the [facility] in the past. She reported that she is interested in going back again. She currently follows with Dr. Candis Schatz on the intermittent basis. She currently denied having any mood swings anger anxiety or paranoia. She appeared calm and collective during the interview.   Social history: Lives by herself. Her siblings try to keep an eye on her. She has an adult daughter who she has a love-hate relationship with. 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  Medical history: Patient has had consequences of alcohol abuse at times with a great deal of weight loss. Gained a little bit of weight since she was able to maintain some sobriety recently.  Substance abuse history: Long history of alcohol abuse. She denies that she's ever had seizures or DTs. I think she's had some rough shakes at times and gets very sad but I'm not sure that she's had full-blown DTs. She's had several hospitalizations for substance abuse    Past Psychiatric History: History of depression multiple hospitalizations and emergency room visits for depression and alcohol abuse. Has been treated with antidepressant medicines in the past and of been of some benefit.  Risk to Self: Suicidal Ideation: No Suicidal Intent: No Is patient at risk for suicide?: No Suicidal Plan?: No Access to Means: No What has been your use of drugs/alcohol within the last 12 months?: Alcohol How many times?: 0 Other Self Harm Risks: None reported Triggers for Past Attempts: None known Intentional Self Injurious Behavior: None Risk to Others: Homicidal Ideation: No Thoughts of Harm to Others: No Current Homicidal Intent: No Current Homicidal Plan: No Access to Homicidal Means: No Identified Victim: None reported History of harm to others?: No Assessment of Violence: None Noted Violent Behavior Description: None reported Does patient have access to weapons?: No Criminal Charges Pending?: No Does patient have a court date: No Prior Inpatient Therapy: Prior Inpatient Therapy: Yes Prior Therapy Dates: 11/2015, 01/2015, 12/2014, 06/2014, 11/2013, 04/2009 Prior Therapy Facilty/Provider(s): [facility], [facility], [facility], [facility] and [place] Reason for Treatment: Depression and Alcohol Use Prior Outpatient Therapy: Prior Outpatient Therapy: Yes Prior Therapy Dates: 2010 Prior Therapy  Facilty/Provider(s): [facility] Reason for Treatment: Alcohol Use Does patient have an ACCT team?: No Does patient have Intensive In-House Services?  : No Does patient have [facility] services? : No Does patient have P4CC services?: No  Past Medical History:  Past Medical History  Diagnosis Date  . Alcohol abuse     Past Surgical History  Procedure Laterality Date  . Cesarean section  1991  . Wrist surgery Right    Family History: No family history on file. Family Psychiatric  History: Family history positive for some depression Social History:  History  Alcohol Use  . Yes     History  Drug Use No    Social History   Social History  . Marital Status: Divorced    Spouse Name: N/A  . Number of Children: N/A  . Years of Education: N/A   Social History Main Topics  . Smoking status: Never Smoker   . Smokeless tobacco: Never Used  . Alcohol Use: Yes  . Drug Use: No  . Sexual Activity: Not Asked   Other Topics Concern  . None   Social History Narrative   Additional Social History:    Allergies:   Allergies  Allergen Reactions  . Codeine Nausea And Vomiting  . Tramadol Nausea Only  . Trazodone And Nefazodone Nausea And Vomiting    Labs:  Results for orders placed or performed during the hospital encounter of 03/29/16 (from the past 48 hour(s))  Acetaminophen level     Status: Abnormal   Collection Time: 03/29/16  5:14 PM  Result Value Ref Range   Acetaminophen (Tylenol), Serum <10 (L) 10 - 30 ug/mL    Comment:        THERAPEUTIC CONCENTRATIONS VARY SIGNIFICANTLY. A RANGE OF 10-30 ug/mL MAY BE AN EFFECTIVE CONCENTRATION FOR MANY PATIENTS. HOWEVER, SOME ARE BEST TREATED AT CONCENTRATIONS OUTSIDE THIS RANGE. ACETAMINOPHEN CONCENTRATIONS >150 ug/mL AT 4 HOURS AFTER INGESTION AND >50 ug/mL AT 12 HOURS AFTER INGESTION ARE OFTEN ASSOCIATED WITH TOXIC REACTIONS.   Comprehensive metabolic panel     Status: None   Collection Time: 03/29/16  5:14 PM   Result Value Ref Range   Sodium 140 135 - 145 mmol/L   Potassium 4.0 3.5 - 5.1 mmol/L   Chloride 103 101 - 111 mmol/L   CO2 26 22 - 32 mmol/L   Glucose, Bld 93 65 - 99 mg/dL   BUN 7 6 - 20 mg/dL   Creatinine, Ser 0.71 0.44 - 1.00 mg/dL   Calcium 9.0 8.9 - 10.3 mg/dL   Total Protein 7.9 6.5 - 8.1 g/dL   Albumin 4.7 3.5 - 5.0 g/dL   AST 29 15 - 41 U/L   ALT 20 14 - 54 U/L   Alkaline Phosphatase 95 38 - 126 U/L   Total Bilirubin 0.5 0.3 - 1.2 mg/dL   GFR calc non Af Amer >60 >60 mL/min   GFR calc Af Amer >60 >60 mL/min    Comment: (NOTE) The eGFR has been calculated using the CKD EPI equation. This calculation has not been validated in all clinical situations. eGFR's persistently <60 mL/min signify possible Chronic Kidney Disease.    Anion gap 11 5 - 15  Salicylate level     Status: None   Collection Time: 03/29/16  5:14 PM  Result Value Ref Range   Salicylate Lvl <5.6 2.8 -  30.0 mg/dL  CBC with Differential     Status: None   Collection Time: 03/29/16  5:14 PM  Result Value Ref Range   WBC 5.4 3.6 - 11.0 K/uL   RBC 4.71 3.80 - 5.20 MIL/uL   Hemoglobin 15.4 12.0 - 16.0 g/dL   HCT 45.4 35.0 - 47.0 %   MCV 96.3 80.0 - 100.0 fL   MCH 32.6 26.0 - 34.0 pg   MCHC 33.9 32.0 - 36.0 g/dL   RDW 12.8 11.5 - 14.5 %   Platelets 321 150 - 440 K/uL   Neutrophils Relative % 57 %   Neutro Abs 3.1 1.4 - 6.5 K/uL   Lymphocytes Relative 34 %   Lymphs Abs 1.8 1.0 - 3.6 K/uL   Monocytes Relative 6 %   Monocytes Absolute 0.3 0.2 - 0.9 K/uL   Eosinophils Relative 2 %   Eosinophils Absolute 0.1 0 - 0.7 K/uL   Basophils Relative 1 %   Basophils Absolute 0.0 0 - 0.1 K/uL  Ethanol     Status: Abnormal   Collection Time: 03/29/16  5:14 PM  Result Value Ref Range   Alcohol, Ethyl (B) 268 (H) <5 mg/dL    Comment:        LOWEST DETECTABLE LIMIT FOR SERUM ALCOHOL IS 5 mg/dL FOR MEDICAL PURPOSES ONLY   Urinalysis complete, with microscopic     Status: Abnormal   Collection Time: 03/29/16   5:28 PM  Result Value Ref Range   Color, Urine YELLOW (A) YELLOW   APPearance CLEAR (A) CLEAR   Glucose, UA NEGATIVE NEGATIVE mg/dL   Bilirubin Urine NEGATIVE NEGATIVE   Ketones, ur NEGATIVE NEGATIVE mg/dL   Specific Gravity, Urine 1.011 1.005 - 1.030   Hgb urine dipstick 1+ (A) NEGATIVE   pH 6.0 5.0 - 8.0   Protein, ur NEGATIVE NEGATIVE mg/dL   Nitrite NEGATIVE NEGATIVE   Leukocytes, UA NEGATIVE NEGATIVE   RBC / HPF 0-5 0 - 5 RBC/hpf   WBC, UA 0-5 0 - 5 WBC/hpf   Bacteria, UA NONE SEEN NONE SEEN   Squamous Epithelial / LPF 0-5 (A) NONE SEEN   Mucous PRESENT   Urine Drug Screen, Qualitative     Status: None   Collection Time: 03/29/16  5:28 PM  Result Value Ref Range   Tricyclic, Ur Screen NONE DETECTED NONE DETECTED   Amphetamines, Ur Screen NONE DETECTED NONE DETECTED   MDMA (Ecstasy)Ur Screen NONE DETECTED NONE DETECTED   Cocaine Metabolite,Ur [place] NONE DETECTED NONE DETECTED   Opiate, Ur Screen NONE DETECTED NONE DETECTED   Phencyclidine (PCP) Ur S NONE DETECTED NONE DETECTED   Cannabinoid 50 Ng, Ur [facility] NONE DETECTED NONE DETECTED   Barbiturates, Ur Screen NONE DETECTED NONE DETECTED   Benzodiazepine, Ur Scrn NONE DETECTED NONE DETECTED   Methadone Scn, Ur NONE DETECTED NONE DETECTED    Comment: (NOTE) 097  Tricyclics, urine               Cutoff 1000 ng/mL 200  Amphetamines, urine             Cutoff 1000 ng/mL 300  MDMA (Ecstasy), urine           Cutoff 500 ng/mL 400  Cocaine Metabolite, urine       Cutoff 300 ng/mL 500  Opiate, urine                   Cutoff 300 ng/mL 600  Phencyclidine (PCP), urine      Cutoff  25 ng/mL 700  Cannabinoid, urine              Cutoff 50 ng/mL 800  Barbiturates, urine             Cutoff 200 ng/mL 900  Benzodiazepine, urine           Cutoff 200 ng/mL 1000 Methadone, urine                Cutoff 300 ng/mL 1100 1200 The urine drug screen provides only a preliminary, unconfirmed 1300 analytical test result and should not be used for  non-medical 1400 purposes. Clinical consideration and professional judgment should 1500 be applied to any positive drug screen result due to possible 1600 interfering substances. A more specific alternate chemical method 1700 must be used in order to obtain a confirmed analytical result.  1800 Gas chromato graphy / mass spectrometry (GC/MS) is the preferred 1900 confirmatory method.     Current Facility-Administered Medications  Medication Dose Route Frequency Provider Last Rate Last Dose  . LORazepam (ATIVAN) injection 1-2 mg  1-2 mg Intravenous Q1H PRN Schuyler Amor, MD      . LORazepam (ATIVAN) tablet 0-4 mg  0-4 mg Oral Q6H Paulette Blanch, MD   1 mg at 03/30/16 0210  . ondansetron (ZOFRAN) 4 MG tablet           . ondansetron (ZOFRAN) 4 MG/2ML injection           . thiamine (B-1) injection 100 mg  100 mg Intravenous Daily Schuyler Amor, MD   100 mg at 03/29/16 1722  . thiamine (VITAMIN B-1) tablet 100 mg  100 mg Oral Daily Paulette Blanch, MD   100 mg at 03/30/16 5993   Current Outpatient Prescriptions  Medication Sig Dispense Refill  . acamprosate (CAMPRAL) 333 MG tablet Take 2 tablets (666 mg total) by mouth 3 (three) times daily with meals. (Patient not taking: Reported on 09/24/2015) 180 tablet 0  . promethazine (PHENERGAN) 25 MG suppository Place 1 suppository (25 mg total) rectally every 6 (six) hours as needed for nausea or vomiting. 12 each 1  . venlafaxine XR (EFFEXOR-XR) 150 MG 24 hr capsule Take 1 capsule (150 mg total) by mouth daily with breakfast. (Patient not taking: Reported on 09/24/2015) 30 capsule 0    Musculoskeletal: Strength & Muscle Tone: within normal limits Gait & Station: unable to stand Patient leans: N/A  Psychiatric Specialty Exam: Review of Systems  Constitutional: Negative.   HENT: Negative.   Eyes: Negative.   Respiratory: Negative.   Cardiovascular: Negative.   Gastrointestinal: Negative.   Musculoskeletal: Negative.   Skin: Negative.    Neurological: Negative.   Psychiatric/Behavioral: Positive for depression and substance abuse. Negative for suicidal ideas, hallucinations and memory loss. The patient is nervous/anxious and has insomnia.     Blood pressure 102/59, pulse 65, temperature 98.2 F (36.8 C), temperature source Oral, resp. rate 18, height _0  (1.626 m), weight 110 lb (49.896 kg), SpO2 99 %.Body mass index is 18.87 kg/(m^2).  General Appearance: Casual  Eye Contact::  Minimal  Speech:  Clear and Coherent and Normal Rate  Volume:  Normal  Mood:  Anxious  Affect:  Congruent  Thought Process:  Goal Directed  Orientation:  Full (Time, Place, and Person)  Thought Content:  Negative  Suicidal Thoughts:  No  Homicidal Thoughts:  No  Memory:  Immediate;   Good Recent;   Fair Remote;   Fair  Judgement:  Impaired  Insight:  Shallow  Psychomotor Activity:  Decreased  Concentration:  Fair  Recall:  [employer] of Knowledge:Good  Language: Fair  Akathisia:  No  Handed:  Right  AIMS (if indicated):     Assets:  Communication Skills Desire for Improvement Financial Resources/Insurance Housing Resilience  ADL's:  Impaired  Cognition: WNL  Sleep:      Treatment Plan Summary: I will release patient from the involuntary commitment as she currently contracted for safety. She denied having any suicidal homicidal ideations or plans. She is not having any withdrawal symptoms. Patient will not be given any medications at this time and  She will also be provided resources about [facility] Thank you for allowing me to participate in the care of this patient  Disposition: Supportive therapy provided about ongoing stressors. Discussed crisis plan, support from social network, calling 911, coming to the Emergency Department, and calling Suicide Hotline.    Rainey Pines, MD 03/30/2016 9:58 AM

## 2016-03-30 NOTE — Clinical Social Work Note (Signed)
Clinical Social Work Assessment  Patient Details  Name: Patty Cruz MRN: [id number] Date of Birth: 03/08/1956  Date of referral:  03/30/16               Reason for consult:  Substance Use/ETOH Abuse                Permission sought to share information with:  Family Supports Permission granted to share information::  Yes, Verbal Permission Granted  Name::     Patty Cruz  Agency::  na  Relationship::  yes  Contact Information:  yes  Housing/Transportation Living arrangements for the past 2 months:  Single Family Home Source of Information:  Patient Patient Interpreter Needed:  None Criminal Activity/Legal Involvement Pertinent to Current Situation/Hospitalization:  No - Comment as needed Significant Relationships:  Adult Children, Siblings Lives with:  Self Do you feel safe going back to the place where you live?  Yes Need for family participation in patient care:  No (Coment)  Care giving concerns: Brother aware his sister has relapsed. ( chronic thing)   Social Worker assessment / plan:  LCSW met with patient. She reports she would like to return to her new home and attend her AA group and see her sponsor. She reported she was not suicidal or homicidal but is agreeable to [facility] program for additional counseling. LCSW provided brochures and community resources list.  Employment status:  Unemployed Insurance information:  Self Pay (Medicaid Pending) PT Recommendations:  Not assessed at this time Information / Referral to community resources:  Residential Substance Abuse Treatment Options, Outpatient Substance Abuse Treatment Options  Patient/Family's Response to care: No contact made/TBD  Patient/Family's Understanding of and Emotional Response to Diagnosis, Current Treatment, and Prognosis: Family is fed up and wants her to remain sober  Emotional Assessment Appearance:  Appears stated age Attitude/Demeanor/Rapport:   (Polite and cooperative) Affect (typically  observed):  Accepting, Hopeful Orientation:   x3 Alcohol / Substance use:   yes- alcohal Psych involvement (Current and /or in the community):  Yes (Comment)  Discharge Needs  Concerns to be addressed:  Coping/Stress Concerns, Substance Abuse Concerns Readmission within the last 30 days:  No Current discharge risk:  None Barriers to Discharge:  No Barriers Identified   Cruz, Patty M, LCSW 03/30/2016, 9:28 AM  

## 2016-03-30 NOTE — ED Notes (Signed)
Pt ambulated with no issues in gait/ambulation

## 2016-03-30 NOTE — ED Notes (Signed)
[facility] cooperative with staff. Pt endorses passive SI but contracts for safety. Pt c/o nausea. She went to sleep on arrival. Fluids/ginger ale provided and 15 minute checks are ongoing for safety.

## 2016-03-30 NOTE — ED Notes (Signed)
Pt getting dressed for discharge. Pt will be picked up be her brother. Pt denies SI and HI. All belongings will be returned to pt upon discharge. Maintained on 15 minute checks and observation by security camera for safety.

## 2016-03-30 NOTE — ED Notes (Signed)
Pt calm and cooperative. Pt denies SI/HI and AVH. No concerns voiced. No distress noted. Maintained on 15 minute checks and observation by security camera for safety.

## 2016-03-30 NOTE — Discharge Instructions (Signed)
Alcohol Use Disorder °Alcohol use disorder is a mental disorder. It is not a one-time incident of heavy drinking. Alcohol use disorder is the excessive and uncontrollable use of alcohol over time that leads to problems with functioning in one or more areas of daily living. People with this disorder risk harming themselves and others when they drink to excess. Alcohol use disorder also can cause other mental disorders, such as mood and anxiety disorders, and serious physical problems. People with alcohol use disorder often misuse other drugs.  °Alcohol use disorder is common and widespread. Some people with this disorder drink alcohol to cope with or escape from negative life events. Others drink to relieve chronic pain or symptoms of mental illness. People with a family history of alcohol use disorder are at higher risk of losing control and using alcohol to excess.  °Drinking too much alcohol can cause injury, accidents, and health problems. One drink can be too much when you are: °· Working. °· Pregnant or breastfeeding. °· Taking medicines. Ask your doctor. °· Driving or planning to drive. °SYMPTOMS  °Signs and symptoms of alcohol use disorder may include the following:  °· Consumption of alcohol in larger amounts or over a longer period of time than intended. °· Multiple unsuccessful attempts to cut down or control alcohol use.   °· A great deal of time spent obtaining alcohol, using alcohol, or recovering from the effects of alcohol (hangover). °· A strong desire or urge to use alcohol (cravings).   °· Continued use of alcohol despite problems at work, school, or home because of alcohol use.   °· Continued use of alcohol despite problems in relationships because of alcohol use. °· Continued use of alcohol in situations when it is physically hazardous, such as driving a car. °· Continued use of alcohol despite awareness of a physical or psychological problem that is likely related to alcohol use. Physical  problems related to alcohol use can involve the brain, heart, liver, stomach, and intestines. Psychological problems related to alcohol use include intoxication, depression, anxiety, psychosis, delirium, and dementia.   °· The need for increased amounts of alcohol to achieve the same desired effect, or a decreased effect from the consumption of the same amount of alcohol (tolerance). °· Withdrawal symptoms upon reducing or stopping alcohol use, or alcohol use to reduce or avoid withdrawal symptoms. Withdrawal symptoms include: °¨ Racing heart. °¨ Hand tremor. °¨ Difficulty sleeping. °¨ Nausea. °¨ Vomiting. °¨ Hallucinations. °¨ Restlessness. °¨ Seizures. °DIAGNOSIS °Alcohol use disorder is diagnosed through an assessment by your health care provider. Your health care provider may start by asking three or four questions to screen for excessive or problematic alcohol use. To confirm a diagnosis of alcohol use disorder, at least two symptoms must be present within a 12-month period. The severity of alcohol use disorder depends on the number of symptoms: °· Mild--two or three. °· Moderate--four or five. °· Severe--six or more. °Your health care provider may perform a physical exam or use results from lab tests to see if you have physical problems resulting from alcohol use. Your health care provider may refer you to a mental health professional for evaluation. °TREATMENT  °Some people with alcohol use disorder are able to reduce their alcohol use to low-risk levels. Some people with alcohol use disorder need to quit drinking alcohol. When necessary, mental health professionals with specialized training in substance use treatment can help. Your health care provider can help you decide how severe your alcohol use disorder is and what type of treatment you need.   The following forms of treatment are available:   Detoxification. Detoxification involves the use of prescription medicines to prevent alcohol withdrawal  symptoms in the first week after quitting. This is important for people with a history of symptoms of withdrawal and for heavy drinkers who are likely to have withdrawal symptoms. Alcohol withdrawal can be dangerous and, in severe cases, cause death. Detoxification is usually provided in a hospital or in-patient substance use treatment facility.  Counseling or talk therapy. Talk therapy is provided by substance use treatment counselors. It addresses the reasons people use alcohol and ways to keep them from drinking again. The goals of talk therapy are to help people with alcohol use disorder find healthy activities and ways to cope with life stress, to identify and avoid triggers for alcohol use, and to handle cravings, which can cause relapse.  Medicines.Different medicines can help treat alcohol use disorder through the following actions:  Decrease alcohol cravings.  Decrease the positive reward response felt from alcohol use.  Produce an uncomfortable physical reaction when alcohol is used (aversion therapy).  Support groups. Support groups are run by people who have quit drinking. They provide emotional support, advice, and guidance. These forms of treatment are often combined. Some people with alcohol use disorder benefit from intensive combination treatment provided by specialized substance use treatment centers. Both inpatient and outpatient treatment programs are available.   This information is not intended to replace advice given to you by your health care provider. Make sure you discuss any questions you have with your health care provider.   Document Released: 12/05/2004 Document Revised: 11/18/2014 Document Reviewed: 02/04/2013 Elsevier Interactive Patient Education [employer]2016 Elsevier Inc. WITH [facility] and [place].

## 2016-04-01 ENCOUNTER — Emergency Department
Admission: EM | Admit: 2016-04-01 | Discharge: 2016-04-01 | Disposition: A | Discharge status: Other Acute Inpatient Hospital | Payer: Self-pay | Attending: Emergency Medicine | Admitting: Emergency Medicine

## 2016-04-01 ENCOUNTER — Inpatient Hospital Stay
Admission: EM | Admit: 2016-04-01 | Discharge: 2016-04-05 | DRG: 897 | Disposition: A | Discharge status: Home / Self Care | Payer: No Typology Code available for payment source | Source: Intra-hospital | Attending: Psychiatry | Admitting: Psychiatry

## 2016-04-01 ENCOUNTER — Encounter: Payer: Self-pay | Admitting: *Deleted

## 2016-04-01 DIAGNOSIS — F10239 Alcohol dependence with withdrawal, unspecified: Secondary | ICD-10-CM | POA: Diagnosis present

## 2016-04-01 DIAGNOSIS — F10939 Alcohol use, unspecified with withdrawal, unspecified: Secondary | ICD-10-CM

## 2016-04-01 DIAGNOSIS — R45851 Suicidal ideations: Secondary | ICD-10-CM

## 2016-04-01 DIAGNOSIS — F1024 Alcohol dependence with alcohol-induced mood disorder: Principal | ICD-10-CM | POA: Diagnosis present

## 2016-04-01 DIAGNOSIS — Z811 Family history of alcohol abuse and dependence: Secondary | ICD-10-CM | POA: Diagnosis not present

## 2016-04-01 DIAGNOSIS — Y908 Blood alcohol level of 240 mg/100 ml or more: Secondary | ICD-10-CM | POA: Diagnosis present

## 2016-04-01 DIAGNOSIS — G4700 Insomnia, unspecified: Secondary | ICD-10-CM | POA: Diagnosis present

## 2016-04-01 DIAGNOSIS — F10229 Alcohol dependence with intoxication, unspecified: Secondary | ICD-10-CM | POA: Insufficient documentation

## 2016-04-01 DIAGNOSIS — F1994 Other psychoactive substance use, unspecified with psychoactive substance-induced mood disorder: Secondary | ICD-10-CM

## 2016-04-01 DIAGNOSIS — F10929 Alcohol use, unspecified with intoxication, unspecified: Secondary | ICD-10-CM

## 2016-04-01 DIAGNOSIS — F1014 Alcohol abuse with alcohol-induced mood disorder: Secondary | ICD-10-CM

## 2016-04-01 DIAGNOSIS — F10129 Alcohol abuse with intoxication, unspecified: Secondary | ICD-10-CM

## 2016-04-01 DIAGNOSIS — F3289 Other specified depressive episodes: Secondary | ICD-10-CM

## 2016-04-01 DIAGNOSIS — F10220 Alcohol dependence with intoxication, uncomplicated: Secondary | ICD-10-CM | POA: Diagnosis present

## 2016-04-01 DIAGNOSIS — F332 Major depressive disorder, recurrent severe without psychotic features: Secondary | ICD-10-CM | POA: Insufficient documentation

## 2016-04-01 DIAGNOSIS — F1020 Alcohol dependence, uncomplicated: Secondary | ICD-10-CM

## 2016-04-01 HISTORY — DX: Other specified health status: Z78.9

## 2016-04-01 HISTORY — DX: Anxiety disorder, unspecified: F41.9

## 2016-04-01 HISTORY — DX: Depression, unspecified: F32.A

## 2016-04-01 HISTORY — DX: Major depressive disorder, single episode, unspecified: F32.9

## 2016-04-01 LAB — CBC
HCT: 43.1 % (ref 35.0–47.0)
Hemoglobin: 14.6 g/dL (ref 12.0–16.0)
MCH: 32.1 pg (ref 26.0–34.0)
MCHC: 33.9 g/dL (ref 32.0–36.0)
MCV: 94.7 fL (ref 80.0–100.0)
PLATELETS: 328 10*3/uL (ref 150–440)
RBC: 4.55 MIL/uL (ref 3.80–5.20)
RDW: 13.1 % (ref 11.5–14.5)
WBC: 8.1 10*3/uL (ref 3.6–11.0)

## 2016-04-01 LAB — ETHANOL: ALCOHOL ETHYL (B): 362 mg/dL — AB (ref <5)

## 2016-04-01 LAB — COMPREHENSIVE METABOLIC PANEL
ALBUMIN: 4.4 g/dL (ref 3.5–5.0)
ALK PHOS: 93 U/L (ref 38–126)
ALT: 19 U/L (ref 14–54)
ANION GAP: 11 (ref 5–15)
AST: 26 U/L (ref 15–41)
BILIRUBIN TOTAL: 0.5 mg/dL (ref 0.3–1.2)
BUN: 10 mg/dL (ref 6–20)
CALCIUM: 8.6 mg/dL — AB (ref 8.9–10.3)
CO2: 24 mmol/L (ref 22–32)
Chloride: 102 mmol/L (ref 101–111)
Creatinine, Ser: 0.53 mg/dL (ref 0.44–1.00)
GFR calc non Af Amer: >60 mL/min (ref >60)
Glucose, Bld: 86 mg/dL (ref 65–99)
Potassium: 4 mmol/L (ref 3.5–5.1)
SODIUM: 137 mmol/L (ref 135–145)
TOTAL PROTEIN: 7.5 g/dL (ref 6.5–8.1)

## 2016-04-01 LAB — SALICYLATE LEVEL

## 2016-04-01 LAB — URINE DRUG SCREEN, QUALITATIVE (ARMC ONLY)
Amphetamines, Ur Screen: NOT DETECTED
BARBITURATES, UR SCREEN: NOT DETECTED
BENZODIAZEPINE, UR SCRN: NOT DETECTED
Cannabinoid 50 Ng, Ur ~~LOC~~: NOT DETECTED
Cocaine Metabolite,Ur ~~LOC~~: NOT DETECTED
MDMA (Ecstasy)Ur Screen: NOT DETECTED
METHADONE SCREEN, URINE: NOT DETECTED
OPIATE, UR SCREEN: NOT DETECTED
Phencyclidine (PCP) Ur S: NOT DETECTED
TRICYCLIC, UR SCREEN: NOT DETECTED

## 2016-04-01 LAB — ACETAMINOPHEN LEVEL

## 2016-04-01 MED ORDER — VITAMIN B-1 100 MG PO TABS
100.0000 mg | ORAL_TABLET | Freq: Every day | ORAL | Status: DC
  Administered 2016-04-01: 100 mg via ORAL
  Filled 2016-04-01 (×2): qty 1

## 2016-04-01 MED ORDER — LORAZEPAM 2 MG PO TABS: 0.0000 mg | ORAL_TABLET | Freq: Two times a day (BID) | ORAL | Status: DC

## 2016-04-01 MED ORDER — THIAMINE HCL 100 MG/ML IJ SOLN: 100.0000 mg | Freq: Every day | INTRAMUSCULAR | Status: DC

## 2016-04-01 MED ORDER — PROMETHAZINE HCL 25 MG PO TABS
ORAL_TABLET | ORAL | Status: AC
  Filled 2016-04-01: qty 1

## 2016-04-01 MED ORDER — LORAZEPAM 2 MG PO TABS
0.0000 mg | ORAL_TABLET | Freq: Four times a day (QID) | ORAL | Status: DC
  Administered 2016-04-01: 1 mg via ORAL
  Filled 2016-04-01: qty 1

## 2016-04-01 MED ORDER — ONDANSETRON 4 MG PO TBDP
ORAL_TABLET | ORAL | Status: AC
  Administered 2016-04-01: 4 mg via ORAL
  Filled 2016-04-01: qty 1

## 2016-04-01 MED ORDER — PROMETHAZINE HCL 25 MG PO TABS
12.5000 mg | ORAL_TABLET | ORAL | Status: DC | PRN
  Administered 2016-04-01: 12.5 mg via ORAL

## 2016-04-01 MED ORDER — ONDANSETRON 8 MG PO TBDP
4.0000 mg | ORAL_TABLET | Freq: Three times a day (TID) | ORAL | Status: DC | PRN
  Administered 2016-04-01: 4 mg via ORAL
  Filled 2016-04-01 (×3): qty 0.5

## 2016-04-01 MED ORDER — ONDANSETRON 4 MG PO TBDP
4.0000 mg | ORAL_TABLET | Freq: Once | ORAL | Status: AC
  Administered 2016-04-01: 4 mg via ORAL

## 2016-04-01 MED ORDER — ONDANSETRON 4 MG PO TBDP
4.0000 mg | ORAL_TABLET | Freq: Once | ORAL | Status: AC
  Administered 2016-04-01: 4 mg via ORAL
  Filled 2016-04-01: qty 1

## 2016-04-01 NOTE — ED Notes (Signed)
Zofran administered as ordered  - pt states  "my stomach is hurting right now."  Continue to monitor

## 2016-04-01 NOTE — ED Notes (Signed)
Pt is intoxicated [place], pt consumed a large bottle of wine this morning, pt reports wanting to kill herself with no plan, pt denies HI, pt is cooperative in triage

## 2016-04-01 NOTE — ED Notes (Signed)
TTS at this time. 

## 2016-04-01 NOTE — ED Notes (Signed)
Patient assigned to appropriate care area. Patient oriented to unit/care area: Informed that, for their safety, care areas are designed for safety and monitored by security cameras at all times; and visiting hours explained to patient. Patient verbalizes understanding, and verbal contract for safety obtained.  Pt tearful. Pt denies SI/HI. Pt does not remember ever making comments about wanting to die. Pt stated she wants help and "doesnt want to die this way (from alcohol)."  Pt expresses remorse for all the hurt she has caused her children. Pt stated "Ive lost them." Pt also ashamed of letting her parents and brother down.   No distress noted. Maintained on 15 minute checks and observation by security camera for safety.

## 2016-04-01 NOTE — ED Notes (Signed)
BEHAVIORAL HEALTH ROUNDING Patient sleeping: Yes.   Patient alert and oriented: eyes closed  Appears to be asleep Behavior appropriate: Yes.  ; If no, describe:  Nutrition and fluids offered:   Sleeping  Toileting and hygiene offered: sleeping Sitter present: q 15 minute observations and security monitoring Law enforcement present: yes  ODS 

## 2016-04-01 NOTE — ED Notes (Signed)
Pt to be admitted to [facility].

## 2016-04-01 NOTE — Consult Note (Signed)
Patty Cruz   Reason for Cruz:  Cruz for this 60 year old woman with a history of alcohol abuse and depression who came back to the hospital about a day and a half after her last discharge once again voicing suicidal ideation Referring Physician:  Mariea Clonts Patient Identification: Patty Cruz MRN:  [id number] Principal Diagnosis: Substance induced mood disorder [facility]) Diagnosis:   Patient Active Problem List   Diagnosis Date Noted  . Substance induced mood disorder ([place]) [F19.94] 04/01/2016  . Suicidal ideation [R45.851] 04/01/2016  . Depression [F32.9]   . Nausea [R11.0] 10/17/2015  . Acute alcohol intoxication ([place]) [F10.129]   . Alcohol dependence with alcohol-induced mood disorder ([place]) [F10.24] 12/28/2014  . Alcohol dependence with uncomplicated withdrawal ([place]) [F10.230] 12/27/2014  . Recurrent major depression-severe ([place]) [F33.2] 12/27/2014    Total Time spent with patient: 1 hour  Subjective:   Patty Cruz is a 60 y.o. female patient admitted with "I'm back again. I was drinking".  HPI:  Patient interviewed. Chart reviewed labs reviewed. 60 year old woman was seen on Friday in the emergency room intoxicated with suicidal ideation. She left the hospital on Saturday. She came back early this morning. She reports that after leaving she immediately went and got a bottle of wine and has been drinking nonstop ever since. She sent a text message to her brother talking about having her body cremated. She told the emergency room doctor when she arrived that she was suicidal and wanted to die. Patient continues to express sadness and negativity but is now denying suicidal ideation. Denies abuse of any other drugs. Says that she didn't think she was planning to go drink when she was here in the emergency room but she immediately relapsed on leaving the ER. Hasn't been taking a day medication. Not having any hallucinations. Major stress continues to be the  relationship with her daughter but now the relapse into drinking as well.  Social history: Patient lives by herself. She has a daughter and brother whom she stays in pretty close contact with but her relationship with him is difficult because they are always criticizing her for her drinking.  Medical history: History of bad shakes no history of seizures. Underweight.  Family history: She does have a family history of some alcohol abuse.  Past Psychiatric History: Patient has a history of heavy alcohol abuse. She's been hospitalized several times before. Multiple episodes of suicidal ideation some with some self injury and attend. She is supposed to be following up with outpatient treatment in taking antidepressants but she is often noncompliant. No history of mania. Doesn't abuse any other drugs. Longest sobriety was for a couple years back in about 2010.  Risk to Self: Suicidal Ideation: Yes-Currently Present Suicidal Intent: No Is patient at risk for suicide?: Yes Suicidal Plan?: No Access to Means: No What has been your use of drugs/alcohol within the last 12 months?: see above How many times?: 0 Other Self Harm Risks: 0 Triggers for Past Attempts: Other (Comment) (no past attempts reported) Intentional Self Injurious Behavior: None Risk to Others: Homicidal Ideation: No Thoughts of Harm to Others: No Current Homicidal Intent: No Current Homicidal Plan: No Access to Homicidal Means: No History of harm to others?: No Assessment of Violence: None Noted Violent Behavior Description: none noted Does patient have access to weapons?: No Criminal Charges Pending?: No Does patient have a court date: No Prior Inpatient Therapy: Prior Inpatient Therapy: Yes Prior Therapy Dates: 11/2015, 01/2015, 12/2014, 06/2014, 11/2013, 04/2009 Prior Therapy  Facilty/Provider(s): [facility], [facility], [facility], [place] Reason for Treatment: Depression and Alcohol Use Prior  Outpatient Therapy: Prior Outpatient Therapy: Yes Prior Therapy Dates: 2010 Prior Therapy Facilty/Provider(s): [facility] Reason for Treatment: Alcohol Use Does patient have an ACCT team?: No Does patient have Intensive In-House Services?  : No Does patient have [facility] services? : No Does patient have P4CC services?: No  Past Medical History:  Past Medical History  Diagnosis Date  . Alcohol abuse     Past Surgical History  Procedure Laterality Date  . Cesarean section  1991  . Wrist surgery Right    Family History: No family history on file. Family Psychiatric  History: Family history of alcohol abuse Social History:  History  Alcohol Use  . Yes    Comment: [place] quantity     History  Drug Use No    Social History   Social History  . Marital Status: Divorced    Spouse Name: N/A  . Number of Children: N/A  . Years of Education: N/A   Social History Main Topics  . Smoking status: Never Smoker   . Smokeless tobacco: Never Used  . Alcohol Use: Yes     Comment: [place] quantity  . Drug Use: No  . Sexual Activity: Not Asked   Other Topics Concern  . None   Social History Narrative   Additional Social History:    Allergies:   Allergies  Allergen Reactions  . Codeine Nausea And Vomiting  . Tramadol Nausea Only  . Trazodone And Nefazodone Nausea And Vomiting    Labs:  Results for orders placed or performed during the hospital encounter of 04/01/16 (from the past 48 hour(s))  Comprehensive metabolic panel     Status: Abnormal   Collection Time: 04/01/16  9:11 AM  Result Value Ref Range   Sodium 137 135 - 145 mmol/L   Potassium 4.0 3.5 - 5.1 mmol/L   Chloride 102 101 - 111 mmol/L   CO2 24 22 - 32 mmol/L   Glucose, Bld 86 65 - 99 mg/dL   BUN 10 6 - 20 mg/dL   Creatinine, Ser 0.53 0.44 - 1.00 mg/dL   Calcium 8.6 (L) 8.9 - 10.3 mg/dL   Total Protein 7.5 6.5 - 8.1 g/dL   Albumin 4.4 3.5 - 5.0 g/dL   AST 26 15 - 41 U/L   ALT 19 14 - 54 U/L    Alkaline Phosphatase 93 38 - 126 U/L   Total Bilirubin 0.5 0.3 - 1.2 mg/dL   GFR calc non Af Amer >60 >60 mL/min   GFR calc Af Amer >60 >60 mL/min    Comment: (NOTE) The eGFR has been calculated using the CKD EPI equation. This calculation has not been validated in all clinical situations. eGFR's persistently <60 mL/min signify possible Chronic Kidney Disease.    Anion gap 11 5 - 15  Ethanol     Status: Abnormal   Collection Time: 04/01/16  9:11 AM  Result Value Ref Range   Alcohol, Ethyl (B) 362 (HH) <5 mg/dL    Comment:        LOWEST DETECTABLE LIMIT FOR SERUM ALCOHOL IS 5 mg/dL FOR MEDICAL PURPOSES ONLY CRITICAL RESULT CALLED TO, READ BACK BY AND VERIFIED WITH: JANIE BOWEN'@1024'$  ON 30/16/01 BY [facility]   Salicylate level     Status: None   Collection Time: 04/01/16  9:11 AM  Result Value Ref Range   Salicylate Lvl <0.9 2.8 - 30.0 mg/dL  Acetaminophen level     Status: Abnormal   Collection Time: 04/01/16  9:11 AM  Result Value Ref Range   Acetaminophen (Tylenol), Serum <10 (L) 10 - 30 ug/mL    Comment:        THERAPEUTIC CONCENTRATIONS VARY SIGNIFICANTLY. A RANGE OF 10-30 ug/mL MAY BE AN EFFECTIVE CONCENTRATION FOR MANY PATIENTS. HOWEVER, SOME ARE BEST TREATED AT CONCENTRATIONS OUTSIDE THIS RANGE. ACETAMINOPHEN CONCENTRATIONS >150 ug/mL AT 4 HOURS AFTER INGESTION AND >50 ug/mL AT 12 HOURS AFTER INGESTION ARE OFTEN ASSOCIATED WITH TOXIC REACTIONS.   cbc     Status: None   Collection Time: 04/01/16  9:11 AM  Result Value Ref Range   WBC 8.1 3.6 - 11.0 K/uL   RBC 4.55 3.80 - 5.20 MIL/uL   Hemoglobin 14.6 12.0 - 16.0 g/dL   HCT 43.1 35.0 - 47.0 %   MCV 94.7 80.0 - 100.0 fL   MCH 32.1 26.0 - 34.0 pg   MCHC 33.9 32.0 - 36.0 g/dL   RDW 13.1 11.5 - 14.5 %   Platelets 328 150 - 440 K/uL  Urine Drug Screen, Qualitative     Status: None   Collection Time: 04/01/16  9:13 AM  Result Value Ref Range   Tricyclic, Ur Screen NONE DETECTED NONE DETECTED   Amphetamines, Ur  Screen NONE DETECTED NONE DETECTED   MDMA (Ecstasy)Ur Screen NONE DETECTED NONE DETECTED   Cocaine Metabolite,Ur [place] NONE DETECTED NONE DETECTED   Opiate, Ur Screen NONE DETECTED NONE DETECTED   Phencyclidine (PCP) Ur S NONE DETECTED NONE DETECTED   Cannabinoid 50 Ng, Ur [employer] NONE DETECTED NONE DETECTED   Barbiturates, Ur Screen NONE DETECTED NONE DETECTED   Benzodiazepine, Ur Scrn NONE DETECTED NONE DETECTED   Methadone Scn, Ur NONE DETECTED NONE DETECTED    Comment: (NOTE) 341  Tricyclics, urine               Cutoff 1000 ng/mL 200  Amphetamines, urine             Cutoff 1000 ng/mL 300  MDMA (Ecstasy), urine           Cutoff 500 ng/mL 400  Cocaine Metabolite, urine       Cutoff 300 ng/mL 500  Opiate, urine                   Cutoff 300 ng/mL 600  Phencyclidine (PCP), urine      Cutoff 25 ng/mL 700  Cannabinoid, urine              Cutoff 50 ng/mL 800  Barbiturates, urine             Cutoff 200 ng/mL 900  Benzodiazepine, urine           Cutoff 200 ng/mL 1000 Methadone, urine                Cutoff 300 ng/mL 1100 1200 The urine drug screen provides only a preliminary, unconfirmed 1300 analytical test result and should not be used for non-medical 1400 purposes. Clinical consideration and professional judgment should 1500 be applied to any positive drug screen result due to possible 1600 interfering substances. A more specific alternate chemical method 1700 must be used in order to obtain a confirmed analytical result.  1800 Gas chromato graphy / mass spectrometry (GC/MS) is the preferred 1900 confirmatory method.     No current facility-administered medications for this encounter.   Current Outpatient Prescriptions  Medication Sig Dispense Refill  . acamprosate (CAMPRAL)  333 MG tablet Take 2 tablets (666 mg total) by mouth 3 (three) times daily with meals. (Patient not taking: Reported on 09/24/2015) 180 tablet 0  . promethazine (PHENERGAN) 25 MG suppository Place 1 suppository (25 mg  total) rectally every 6 (six) hours as needed for nausea or vomiting. 12 each 1  . venlafaxine XR (EFFEXOR-XR) 150 MG 24 hr capsule Take 1 capsule (150 mg total) by mouth daily with breakfast. (Patient not taking: Reported on 09/24/2015) 30 capsule 0    Musculoskeletal: Strength & Muscle Tone: decreased Gait & Station: normal Patient leans: N/A  Psychiatric Specialty Exam: Physical Exam  Nursing note and vitals reviewed. HENT:  Head: Normocephalic and atraumatic.  Eyes: Conjunctivae are normal. Pupils are equal, round, and reactive to light.  Neck: Normal range of motion.  Cardiovascular: Normal heart sounds.   Respiratory: Effort normal.  GI: Soft.  Musculoskeletal: Normal range of motion.  Neurological: She is alert.  Skin: Skin is warm and dry.  Psychiatric: Her mood appears anxious. Her affect is blunt. Her speech is delayed. She is slowed. She expresses impulsivity. She exhibits a depressed mood. She expresses suicidal ideation. She exhibits abnormal recent memory.    Review of Systems  Constitutional: Negative.   HENT: Negative.   Eyes: Negative.   Respiratory: Negative.   Cardiovascular: Negative.   Gastrointestinal: Positive for nausea.  Musculoskeletal: Negative.   Skin: Negative.   Neurological: Negative.   Psychiatric/Behavioral: Positive for depression, suicidal ideas, memory loss and substance abuse. Negative for hallucinations. The patient is nervous/anxious and has insomnia.     Blood pressure 112/72, pulse 112, temperature 98.4 F (36.9 C), temperature source Oral, resp. rate 18, height '5\' 2"'$  (1.575 m), weight 49.896 kg (110 lb), SpO2 94 %.Body mass index is 20.11 kg/(m^2).  General Appearance: Disheveled  Eye Contact:  Minimal  Speech:  Slow  Volume:  Decreased  Mood:  Dysphoric  Affect:  Depressed  Thought Process:  Coherent  Orientation:  Full (Time, Place, and Person)  Thought Content:  Negative  Suicidal Thoughts:  Yes.  with intent/plan  Homicidal  Thoughts:  No  Memory:  Immediate;   Good Recent;   Poor Remote;   Fair  Judgement:  Impaired  Insight:  Shallow  Psychomotor Activity:  Decreased  Concentration:  Concentration: Fair and Attention Span: Poor  Recall:  [employer] of Knowledge:  Fair  Language:  Fair  Akathisia:  No  Handed:  Right  AIMS (if indicated):     Assets:  Communication Skills Desire for Improvement Financial Resources/Insurance Housing Resilience Social Support  ADL's:  Intact  Cognition:  Impaired,  Mild  Sleep:        Treatment Plan Summary: Daily contact with patient to assess and evaluate symptoms and progress in treatment, Medication management and Plan 60 year old woman with a history of alcohol abuse and depression. This is her second visit to the emergency room over the weekend. Her first visit she had convincingly denied to me any suicidal ideation and was released home on Saturday after sobering up. She immediately started drinking and came back once again talking about killing herself. At this point we will admit her to the hospital because of suicidal ideation. Patient advised of the plan. Continue her outpatient antidepressant with venlafaxine at a dose of 150 mg a day. When necessary medicine for withdrawal. Suicidal ideation will be covered with the continuous observation.  Disposition: Recommend psychiatric Inpatient admission when medically cleared. Supportive therapy provided about ongoing stressors.  Alethia Berthold, MD 04/01/2016 4:44 PM

## 2016-04-01 NOTE — ED Notes (Signed)
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

## 2016-04-01 NOTE — ED Notes (Signed)
BEHAVIORAL HEALTH ROUNDING Patient sleeping: Yes.   Patient alert and oriented: eyes closed  Appears to be asleep Behavior appropriate: Yes.  ; If no, describe:  Nutrition and fluids offered: Yes  Toileting and hygiene offered: sleeping Sitter present: q 15 minute observations and security monitoring Law enforcement present: yes  ODS 

## 2016-04-01 NOTE — Tx Team (Signed)
Initial Interdisciplinary Treatment Plan   PATIENT STRESSORS: Substance abuse   PATIENT STRENGTHS: General fund of knowledge Motivation for treatment/growth   PROBLEM LIST: Problem List/Patient Goals Date to be addressed Date deferred Reason deferred Estimated date of resolution  Substance Abuse 04/01/16     Suicidal Ideation 04/01/16     Depression 04/01/16                                          DISCHARGE CRITERIA:  Improved stabilization in mood, thinking, and/or behavior  PRELIMINARY DISCHARGE PLAN: Outpatient therapy  PATIENT/FAMIILY INVOLVEMENT: This treatment plan has been presented to and reviewed with the patient, Patty Cruz, and/or family member.  The patient and family have been given the opportunity to ask questions and make suggestions.  Patty Cruz, Patty Cruz 04/01/2016, 10:35 PM

## 2016-04-01 NOTE — ED Notes (Signed)
Report called to The PepsiChristine RN in the BMU.

## 2016-04-01 NOTE — ED Notes (Signed)
Report given to Serena Petterson B RN in behavioral holding unit  Pt to transfer at this time   She is ambulatory with a steady gait

## 2016-04-01 NOTE — ED Notes (Addendum)
Pt given medication for nausea. Pt asking for medication for withdrawal. Psychiatrist made aware and did not think that was necessary at this time. RN observed no tremor or sweating and pt did not complain of a headache. Blood pressure within normal limits. Maintained on 15 minute checks and observation by security camera for safety.

## 2016-04-01 NOTE — BH Assessment (Addendum)
Tele Assessment Note   Patty LobeJane S Cruz is a 60 y.o. female who [facility] bib law enforcement (later IVC'd by EDP) due to being drunk with SI. Pt was able to complete the assessment appropriately, although she displayed some thought blocking, most likely due to her current intoxication. Pt endorsed SI x 7-8 days. Pt reported having no plan, b/c "I have no way to kill myself". Pt denied HI or AVH. Pt reported last having a drink this morning ("a whole lot"). Pt repeatedly said "I want to die" and "they won't let me die", referring to the police.   Diagnosis: MDD, recurrent episode, severe; Alcohol use d/o, severe  Past Medical History:  Past Medical History  Diagnosis Date  . Alcohol abuse     Past Surgical History  Procedure Laterality Date  . Cesarean section  1991  . Wrist surgery Right     Family History: No family history on file.  Social History:  reports that she has never smoked. She has never used smokeless tobacco. She reports that she drinks alcohol. She reports that she does not use illicit drugs.  Additional Social History:  Alcohol / Drug Use Pain Medications: See PTA Prescriptions: See PTA Over the Counter: See PTA History of alcohol / drug use?: Yes  CIWA: CIWA-Ar BP: 115/72 mmHg Pulse Rate: 96 COWS:    PATIENT STRENGTHS: (choose at least two) Average or above average intelligence Capable of independent living  Allergies:  Allergies  Allergen Reactions  . Codeine Nausea And Vomiting  . Tramadol Nausea Only  . Trazodone And Nefazodone Nausea And Vomiting    Home Medications:  (Not in a hospital admission)  OB/GYN Status:  No LMP recorded. Patient is postmenopausal.  General Assessment Data [facility] TTS Assessment: In system Is this a Tele or Face-to-Face Assessment?: Tele Assessment Is this an Initial Assessment or a Re-assessment for this encounter?: Initial Assessment Marital status: Divorced ElizabethMaiden name: Roads Is  patient pregnant?: No Pregnancy Status: No Living Arrangements: Alone Can pt return to current living arrangement?: Yes Admission Status: Involuntary Is patient capable of signing voluntary admission?: Yes Referral Source: Self/Family/Friend Insurance type: none  Medical Screening Exam [facility] Walk-in ONLY) Medical Exam completed: Yes  Crisis Care Plan Living Arrangements: Alone Name of Psychiatrist: none reported Name of Therapist: none reported  Education Status Is patient currently in school?: No Highest grade of school patient has completed: Bachelor's Degree Name of school: [employer] Contact person: n/a  Risk to self with the past 6 months Suicidal Ideation: Yes-Currently Present Has patient been a risk to self within the past 6 months prior to admission? : No Suicidal Intent: No Has patient had any suicidal intent within the past 6 months prior to admission? : No Is patient at risk for suicide?: Yes Suicidal Plan?: No Has patient had any suicidal plan within the past 6 months prior to admission? : No Access to Means: No What has been your use of drugs/alcohol within the last 12 months?: see above Previous Attempts/Gestures: No How many times?: 0 Other Self Harm Risks: 0 Triggers for Past Attempts: Other (Comment) (no past attempts reported) Intentional Self Injurious Behavior: None Family Suicide History: No Recent stressful life event(s): Other (Comment) (relapse on alcohol) Persecutory voices/beliefs?: No Depression: Yes Depression Symptoms: Feeling angry/irritable, Tearfulness, Insomnia Substance abuse history and/or treatment for substance abuse?: No Suicide prevention information given to non-admitted patients: Not applicable  Risk to Others within the past 6 months Homicidal  Ideation: No Does patient have any lifetime risk of violence toward others beyond the six months prior to admission? : No Thoughts of Harm to Others: No Current Homicidal Intent:  No Current Homicidal Plan: No Access to Homicidal Means: No History of harm to others?: No Assessment of Violence: None Noted Violent Behavior Description: none noted Does patient have access to weapons?: No Criminal Charges Pending?: No Does patient have a court date: No Is patient on probation?: No  Psychosis Hallucinations: None noted Delusions: None noted  Mental Status Report Appearance/Hygiene: Unremarkable Eye Contact: Poor Motor Activity: Unremarkable Speech: Logical/coherent, Soft, Slurred Level of Consciousness: Drowsy, Quiet/awake, Irritable Mood: Irritable, Depressed Affect: Appropriate to circumstance, Depressed, Irritable Anxiety Level: None Thought Processes: Coherent, Relevant, Thought Blocking Judgement: Impaired Orientation: Person, Place, Time, Situation, Appropriate for developmental age Obsessive Compulsive Thoughts/Behaviors: None  Cognitive Functioning Concentration: Decreased Memory: Recent Impaired, Remote Impaired IQ: Average Insight: see judgement above Impulse Control: Unable to Assess Appetite: Poor Sleep: Decreased Vegetative Symptoms: None  ADLScreening [facility] Assessment Services) Patient's cognitive ability adequate to safely complete daily activities?: Yes Patient able to express need for assistance with ADLs?: Yes Independently performs ADLs?: Yes (appropriate for developmental age)  Prior Inpatient Therapy Prior Inpatient Therapy: Yes Prior Therapy Dates: 11/2015, 01/2015, 12/2014, 06/2014, 11/2013, 04/2009 Prior Therapy Facilty/Provider(s): [facility], [facility], [facility], [facility] Reason for Treatment: Depression and Alcohol Use  Prior Outpatient Therapy Prior Outpatient Therapy: Yes Prior Therapy Dates: 2010 Prior Therapy [facility]ilty[facility]Provider([facility]): [facility] Reason for Treatment: Alcohol Use Does patient have an ACCT team?: No Does patient have Intensive In-House Services?  : No Does patient have  [facility] services? : No Does patient have P4CC services?: No  ADL Screening (condition at time of admission) Patient's cognitive ability adequate to safely complete daily activities?: Yes Is the patient deaf or have difficulty hearing?: No Does the patient have difficulty seeing, even when wearing glasses/contacts?: No Does the patient have difficulty concentrating, remembering, or making decisions?: Yes Patient able to express need for assistance with ADLs?: Yes Does the patient have difficulty dressing or bathing?: No Independently performs ADLs?: Yes (appropriate for developmental age) Does the patient have difficulty walking or climbing stairs?: No Weakness of Legs: None Weakness of Arms/Hands: None  Home Assistive Devices/Equipment Home Assistive Devices/Equipment: None  Therapy Consults (therapy consults require a physician order) PT Evaluation Needed: No OT Evalulation Needed: No SLP Evaluation Needed: No Abuse/Neglect Assessment (Assessment to be complete while patient is alone) Physical Abuse: Denies Verbal Abuse: Denies Sexual Abuse: Denies Exploitation of patient/patient's resources: Denies Self-Neglect: Denies Values / Beliefs Cultural Requests During Hospitalization: None Spiritual Requests During Hospitalization: None Consults Spiritual Care Consult Needed: No Social Work Consult Needed: No Merchant navy officer (For Healthcare) Does patient have an advance directive?: No Would patient like information on creating an advanced directive?: No - patient declined information    Additional Information 1:1 In Past 12 Months?: No CIRT Risk: No Elopement Risk: No Does patient have medical clearance?: No     Disposition:  Disposition Initial Assessment Completed for this Encounter: Yes Disposition of Patient: Other dispositions (To be seen by psychiatrist)  Laddie Aquas 04/01/2016 10:13 AM

## 2016-04-01 NOTE — ED Notes (Addendum)

## 2016-04-01 NOTE — BH Assessment (Signed)
Per Dr. Clapacs, patient meets criteria for inpatient hospitalization.     Writer informed the Charge Nurse (Phyllis) that the patient is in need of a bed at [facility].  

## 2016-04-01 NOTE — ED Provider Notes (Signed)
[facility] Emergency Department Provider Note  ____________________________________________  Time seen: Approximately 9:02 AM  I have reviewed the triage vital signs and the nursing notes.   HISTORY  Chief Complaint Suicidal    HPI Patty Cruz is a 60 y.o. female with a history of alcohol dependence, alcohol-induced mood disorder and recurrent major depression presenting with suicidal ideations. The patient reports that she drank a significant amount alcohol, and "I don't live anymore." She does not have a specific plan for suicide attempt, and states "all I care about his my cat." No SI or hallucinations. No medical complaints at this time.   Past Medical History  Diagnosis Date  . Alcohol abuse     Patient Active Problem List   Diagnosis Date Noted  . Depression   . Nausea 10/17/2015  . Acute alcohol intoxication (HCC)   . Alcohol dependence with alcohol-induced mood disorder (HCC) 12/28/2014  . Alcohol dependence with uncomplicated withdrawal (HCC) 12/27/2014  . Recurrent major depression-severe (HCC) 12/27/2014    Past Surgical History  Procedure Laterality Date  . Cesarean section  1991  . Wrist surgery Right     Current Outpatient Rx  Name  Route  Sig  Dispense  Refill  . acamprosate (CAMPRAL) 333 MG tablet   Oral   Take 2 tablets (666 mg total) by mouth 3 (three) times daily with meals. Patient not taking: Reported on 09/24/2015   180 tablet   0   . promethazine (PHENERGAN) 25 MG suppository   Rectal   Place 1 suppository (25 mg total) rectally every 6 (six) hours as needed for nausea or vomiting.   12 each   1     [facility] [address] ...   . venlafaxine XR (EFFEXOR-XR) 150 MG 24 hr capsule   Oral   Take 1 capsule (150 mg total) by mouth daily with breakfast. Patient not taking: Reported on 09/24/2015   30 capsule   0     Allergies Codeine; Tramadol; and Trazodone and nefazodone  No  family history on file.  Social History Social History  Substance Use Topics  . Smoking status: Never Smoker   . Smokeless tobacco: Never Used  . Alcohol Use: Yes     Comment: unkwown quantity    Review of Systems Constitutional: No fever/chills.No lightheadedness or syncope. Eyes: No visual changes. ENT: No sore throat. No congestion or rhinorrhea. Cardiovascular: Denies chest pain. Denies palpitations. Respiratory: Denies shortness of breath.  No cough. Gastrointestinal: No abdominal pain.  No nausea, no vomiting.  No diarrhea.  No constipation. Genitourinary: Negative for dysuria. Musculoskeletal: Negative for back pain. Skin: Negative for rash. Neurological: Negative for headaches. No focal numbness, tingling or weakness.  Psychiatric:Positive SI. Negative HI or hallucinations. Positive alcohol abuse.  10-point ROS otherwise negative.  ____________________________________________   PHYSICAL EXAM:  VITAL SIGNS: ED Triage Vitals  Enc Vitals Group     BP 04/01/16 0811 115/72 mmHg     Pulse Rate 04/01/16 0811 96     Resp 04/01/16 0811 20     Temp 04/01/16 0811 98.2 F (36.8 C)     Temp Source 04/01/16 0811 Oral     SpO2 04/01/16 0811 97 %     Weight 04/01/16 0811 110 lb (49.896 kg)     Height 04/01/16 0811 5\' 2"  (1.575 m)     Head Cir --      Peak Flow --      Pain Score --  Pain Loc --      Pain Edu? --      Excl. in GC? --     Constitutional: Alert and oriented. Intoxicated but nontoxic. Answers questions appropriately. Eyes: Conjunctivae are normal.  EOMI. No scleral icterus. Head: Atraumatic. Nose: No congestion/rhinnorhea. Mouth/Throat: Mucous membranes are moist.  Neck: No stridor.  Supple.  No JVD. No meningismus. Cardiovascular: Normal rate, regular rhythm. No murmurs, rubs or gallops.  Respiratory: Normal respiratory effort.  No accessory muscle use or retractions. Lungs CTAB.  No wheezes, rales or ronchi. Gastrointestinal: Soft, nontender and  nondistended.  No guarding or rebound.  No peritoneal signs. Musculoskeletal: No LE edema.  Neurologic:  A&Ox3.  Speech is mildly slurred but patient is able to answer questions appropriately..  Face and smile are symmetric.  EOMI.  Moves all extremities well. Skin:  Skin is warm, dry and intact. No rash noted. Psychiatric: Depressed mood and flat affect with poor judgment.  ____________________________________________   LABS (all labs ordered are listed, but only abnormal results are displayed)  Labs Reviewed  COMPREHENSIVE METABOLIC PANEL - Abnormal; Notable for the following:    Calcium 8.6 (*)    All other components within normal limits  ETHANOL - Abnormal; Notable for the following:    Alcohol, Ethyl (B) 362 (*)    All other components within normal limits  ACETAMINOPHEN LEVEL - Abnormal; Notable for the following:    Acetaminophen (Tylenol), Serum <10 (*)    All other components within normal limits  SALICYLATE LEVEL  CBC  URINE DRUG SCREEN, QUALITATIVE ([facility] ONLY)   ____________________________________________  EKG  Not indicated ____________________________________________  RADIOLOGY  No results found.  ____________________________________________   PROCEDURES  Procedure(s) performed: None  Critical Care performed: No ____________________________________________   INITIAL IMPRESSION / ASSESSMENT AND PLAN / ED COURSE  Pertinent labs & imaging results that were available during my care of the patient were reviewed by me and considered in my medical decision making (see chart for details).  60 y.o. female with a history of alcohol dependence and alcohol induced mood disorder, severe depression, presenting with SI. The patient does appear grossly intoxicated I'll get an alcohol level to confirm this. At this time she has no medical complaints and I will complete her medical clearance, and psychiatry will be involved regarding her  SI.  ----------------------------------------- 3:55 PM on 04/01/2016 -----------------------------------------  Patient has been medically cleared and is awaiting psychiatric disposition.  ____________________________________________  FINAL CLINICAL IMPRESSION(S) / ED DIAGNOSES  Final diagnoses:  Alcohol intoxication, with unspecified complication (HCC)  Suicidal ideation      NEW MEDICATIONS STARTED DURING THIS VISIT:  New Prescriptions   No medications on file     Rockne Menghini, MD 04/01/16 1555

## 2016-04-01 NOTE — ED Notes (Signed)
BEHAVIORAL HEALTH ROUNDING Patient sleeping: No. Patient alert and oriented: yes Behavior appropriate: Yes.  ; If no, describe:  Nutrition and fluids offered: yes Toileting and hygiene offered: Yes  Sitter present: q15 minute observations and security  monitoring Law enforcement present: Yes  ODS  

## 2016-04-02 DIAGNOSIS — F10939 Alcohol use, unspecified with withdrawal, unspecified: Secondary | ICD-10-CM

## 2016-04-02 DIAGNOSIS — F1020 Alcohol dependence, uncomplicated: Secondary | ICD-10-CM

## 2016-04-02 DIAGNOSIS — F10239 Alcohol dependence with withdrawal, unspecified: Secondary | ICD-10-CM

## 2016-04-02 DIAGNOSIS — F10129 Alcohol abuse with intoxication, unspecified: Secondary | ICD-10-CM

## 2016-04-02 DIAGNOSIS — F1014 Alcohol abuse with alcohol-induced mood disorder: Secondary | ICD-10-CM

## 2016-04-02 DIAGNOSIS — F3289 Other specified depressive episodes: Secondary | ICD-10-CM

## 2016-04-02 HISTORY — DX: Alcohol dependence with withdrawal, unspecified: F10.239

## 2016-04-02 HISTORY — DX: Alcohol dependence, uncomplicated: F10.20

## 2016-04-02 HISTORY — DX: Alcohol abuse with intoxication, unspecified: F10.129

## 2016-04-02 HISTORY — DX: Alcohol use, unspecified with withdrawal, unspecified: F10.939

## 2016-04-02 MED ORDER — ALUM & MAG HYDROXIDE-SIMETH 200-200-20 MG/5ML PO SUSP: 30.0000 mL | ORAL | Status: DC | PRN

## 2016-04-02 MED ORDER — ACETAMINOPHEN 325 MG PO TABS: 650.0000 mg | ORAL_TABLET | Freq: Four times a day (QID) | ORAL | Status: DC | PRN

## 2016-04-02 MED ORDER — HYDROXYZINE HCL 50 MG PO TABS
50.0000 mg | ORAL_TABLET | Freq: Once | ORAL | Status: AC
  Administered 2016-04-02: 50 mg via ORAL
  Filled 2016-04-02: qty 1

## 2016-04-02 MED ORDER — LORAZEPAM 2 MG PO TABS
ORAL_TABLET | ORAL | Status: AC
  Filled 2016-04-02: qty 1

## 2016-04-02 MED ORDER — VENLAFAXINE HCL ER 75 MG PO CP24
150.0000 mg | ORAL_CAPSULE | Freq: Every day | ORAL | Status: DC
  Administered 2016-04-02: 150 mg via ORAL
  Filled 2016-04-02: qty 2

## 2016-04-02 MED ORDER — LORAZEPAM 1 MG PO TABS
1.0000 mg | ORAL_TABLET | Freq: Four times a day (QID) | ORAL | Status: DC | PRN
  Administered 2016-04-02 (×2): 1 mg via ORAL
  Filled 2016-04-02 (×2): qty 1

## 2016-04-02 MED ORDER — THIAMINE HCL 100 MG/ML IJ SOLN
100.0000 mg | Freq: Once | INTRAMUSCULAR | Status: AC
  Administered 2016-04-02: 100 mg via INTRAMUSCULAR
  Filled 2016-04-02: qty 2

## 2016-04-02 MED ORDER — CHLORDIAZEPOXIDE HCL 25 MG PO CAPS
50.0000 mg | ORAL_CAPSULE | Freq: Three times a day (TID) | ORAL | Status: DC
  Administered 2016-04-02 – 2016-04-04 (×8): 50 mg via ORAL
  Filled 2016-04-02 (×9): qty 2

## 2016-04-02 MED ORDER — MAGNESIUM HYDROXIDE 400 MG/5ML PO SUSP: 30.0000 mL | Freq: Every day | ORAL | Status: DC | PRN

## 2016-04-02 MED ORDER — MAGNESIUM HYDROXIDE 400 MG/5ML PO SUSP
30.0000 mL | Freq: Every day | ORAL | Status: DC | PRN
  Administered 2016-04-03: 30 mL via ORAL
  Filled 2016-04-02: qty 30

## 2016-04-02 MED ORDER — HYDROXYZINE HCL 25 MG PO TABS: 25.0000 mg | ORAL_TABLET | Freq: Four times a day (QID) | ORAL | Status: DC | PRN

## 2016-04-02 MED ORDER — ADULT MULTIVITAMIN W/MINERALS CH
1.0000 | ORAL_TABLET | Freq: Every day | ORAL | Status: DC
  Administered 2016-04-02 – 2016-04-05 (×4): 1 via ORAL
  Filled 2016-04-02 (×4): qty 1

## 2016-04-02 MED ORDER — VITAMIN B-1 100 MG PO TABS
100.0000 mg | ORAL_TABLET | Freq: Every day | ORAL | Status: DC
  Administered 2016-04-03: 100 mg via ORAL
  Filled 2016-04-02: qty 1

## 2016-04-02 MED ORDER — LOPERAMIDE HCL 2 MG PO CAPS: 2.0000 mg | ORAL_CAPSULE | ORAL | Status: DC | PRN

## 2016-04-02 MED ORDER — ONDANSETRON 4 MG PO TBDP
4.0000 mg | ORAL_TABLET | Freq: Four times a day (QID) | ORAL | Status: DC | PRN
  Filled 2016-04-02: qty 1

## 2016-04-02 MED ORDER — LORAZEPAM 2 MG PO TABS
2.0000 mg | ORAL_TABLET | Freq: Three times a day (TID) | ORAL | Status: DC | PRN
  Administered 2016-04-02: 2 mg via ORAL
  Filled 2016-04-02: qty 1

## 2016-04-02 NOTE — [facility] Suicide Risk Assessment (Addendum)
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 Alcohol use disorder, severe, dependence (HCC) [F10.20] 04/02/2016  . Alcohol withdrawal (HCC) [F10.239] 04/02/2016  . 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 Suicidal ideation [R45.851] 04/01/2016   Subjective Data:   Continued Clinical Symptoms:  Alcohol Use Disorder Identification Test Final Score (AUDIT): 25 The "Alcohol Use Disorders Identification Test", Guidelines for Use in Primary Care, Second Edition.  World Science writerHealth Organization [place](WHO). Score between 0-7:  no or low risk or alcohol related problems. Score between 8-15:  moderate risk of alcohol related problems. Score between 16-19:  high risk of alcohol related problems. Score 20 or above:  warrants further diagnostic evaluation for alcohol dependence and treatment.   CLINICAL FACTORS:     Psychiatric Specialty Exam: Physical Exam  ROS  Blood pressure 121/81, pulse 85, temperature 98.2 F (36.8 C), temperature source Oral, resp. rate 18, height 5\' 5"  (1.651 m), weight 67.994 kg (149 lb 14.4 oz), SpO2 99 %.Body mass index is 24.94 kg/(m^2).    COGNITIVE FEATURES THAT CONTRIBUTE TO RISK:  None    SUICIDE RISK:   Moderate:  Frequent suicidal ideation with limited intensity, and duration, some specificity in terms of plans, no associated intent, good self-control, limited dysphoria/symptomatology, some risk factors present,  and identifiable protective factors, including available and accessible social support.  [facility]  I certify that inpatient services furnished can reasonably be expected to improve the patient's condition.   Jimmy FootmanHernandez-Gonzalez,  Cristobal Advani, MD 04/02/2016, 2:01 PM

## 2016-04-02 NOTE — Progress Notes (Signed)
Recreation Therapy Notes  INPATIENT RECREATION THERAPY ASSESSMENT  Patient Details Name: Patty LobeJane S Harty MRN: [id number] DOB: 11-21-1955 Today's Date: 04/02/2016  Patient Stressors: Family, Work, Other (Comment) Doesn't know if she has a good relationship with her family-brother and sister are behind her to get help; children do not believe in [employer] and the church the patient attends-they are disgusted with her and do not believe she will do this on her own; lost job recently due to alcohol; in process of moving-found fleas in carpet of new house; does not know where she will be going after d/c; wants to find a program and roommate.  Coping Skills:   Isolate, Substance Abuse, Avoidance, Exercise, Talking, Music, Sports, Other (Comment) (Gardening, reading, solitare)  Personal Challenges: Decision-Making, Expressing Yourself, Relationships, Self-Esteem/Confidence, Social Interaction, Stress Management, Substance Abuse  Leisure Interests (2+):  Individual - Other (Comment) (Read, walk)  Awareness of Community Resources:  Yes  Community Resources:  [employer], Other (Comment) Armed forces logistics/support/administrative officer([facility])  Current Use: No  If no, Barriers?: Social  Patient Strengths:  Caring, always trying to do something for someone else to make them smile  Patient Identified Areas of Improvement:  Self-esteem, relationships, communication skills, whole life  Current Recreation Participation:  Moving and lining shelves, reading the Bible and newspaper  Patient Goal for Hospitalization:  Take everything she can and apply it to life to stay sober  [place]  [place]idence:  [place]   Current SI (including self-harm):  No  Current HI:  No  Consent to Intern Participation: N/A   Jacquelynn CreeGreene,Giancarlo Askren M, LRT/CTRS 04/02/2016, 2:25 PM

## 2016-04-02 NOTE — Progress Notes (Signed)
D: Patient is a 60 y.o.  year-old female admitted to [facility] ambulatory without difficulty. Patient is alert and oriented upon admission. A: Admission assessment completed without difficulty. Skin and contraband assessment completed with Heather tech with no skin abnormalities nor contraband found. Q.15 minute safety checks were implemented at the time of admission. Patient was oriented to the unit and escorted to room   25                                                    R: Patient was receptive to and cooperative with admission assessment. Patient contracts for safety on the unit at this time

## 2016-04-02 NOTE — H&P (Signed)
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 Alcohol use disorder, severe, dependence ([place]) [F10.20] 04/02/2016  . Alcohol withdrawal ([place]) [F10.239] 04/02/2016  . 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 Suicidal ideation [R45.851] 04/01/2016   History of Present Illness: Patty Cruz is a 60 y.o. female with a history of alcohol dependence, alcohol-induced mood disorder who presented to our ER on 5/22  with suicidal ideations and an alcohol level 362. The patient reported that she drank a significant amount alcohol, and "I don't live anymore", "all I care about his my cat."  Today the patient tells me that she is no longer suicidal and does not feel that she is depressed. She feels he was "the alcohol talking".  Patient stated that she will never hurt herself because of her children. Patient denies any prior history of suicidal attempts or self injury.  This patient is well-known to me as she has been hospitalized in our facility a multitude of times in the past for complications related to alcohol use. Looks a she is being in the emergency department every month with high alcohol levels. Prior to this presentation on May 22 she had been seen in the emergency department on May 19.  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 She has been unemployed now for more than a year. She has been living in a house that she's been renting for many years and the landlord has allow her to stay there without having to pay rent for the last year.  She was recently asked to  move out of the house as the house is all and will be demolished.    The only income she has at this time is the inheritance that her father left her.  Patient is not receiving any kind of psychiatric or substance abuse treatment. She has been going to [place] hasn't a sponsor. Says that she goes to meetings to 4 times a day. She says that AA is like "my family".  Patient has 3 adult children ages 19, 71, 80 years old but they are not close.  Substance abuse history patient has a long history of alcoholism. Her longest period of sobriety was in 2011 after discharge from [facility] when she was sober for 2 years and a half.  She said that she also stated sober from March to October of last year. Denies the use of any illicit substances. Denies the use of cigarettes  Associated Signs/Symptoms: Depression Symptoms:  denies (Hypo) Manic Symptoms:  denies Anxiety Symptoms:  denies Psychotic Symptoms:  denies PTSD Symptoms: Negative Total Time spent with patient: 1 hour  Past Psychiatric History: Long history of alcoholism. Currently not receiving any psychiatric care. Multiple prior psychiatric hospitalizations due to alcoholism. Has been in rehabilitation for substance abuse a multitude of times. Denies any history of self injury or suicidal attempts in the past. Currently not on any psychiatric medications  Is the patient at risk to self? Yes.    Has the patient been a risk to self in the past 6 months? No.  Has the patient been a risk to self within  the distant past? No.  Is the patient a risk to others? No.  Has the patient been a risk to others in the past 6 months? No.  Has the patient been a risk to others within the distant past? No.   Past Medical History: Denies any history of seizures or head Trauma Past Medical History  Diagnosis Date  . Alcohol abuse   . Medical history non-contributory   . Depression   . Anxiety     Past Surgical History  Procedure Laterality Date  .  Cesarean section  1991  . Wrist surgery Right   . No past surgeries     Family History: History reviewed. No pertinent family history.  Family Psychiatric  History: Patient reports that her brother is an alcoholic. Denies any history of suicide in her family. Denies any history of mental illness in her family  Tobacco Screening: non smoker  Social History: Patient has been unemployed for more than a year. 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 From July to December of last year she worked briefly at [employer] but after she relapsed on alcohol human resources demanded her to go to rehabilitation but she declined. No medical insurance insurance at this time History  Alcohol Use  . Yes    Comment: [place] quantity     History  Drug Use No     Allergies:   Allergies  Allergen Reactions  . Codeine Nausea And Vomiting  . Tramadol Nausea Only  . Trazodone And Nefazodone Nausea And Vomiting   Lab Results:  Results for orders placed or performed during the hospital encounter of 04/01/16 (from the past 48 hour(s))  Comprehensive metabolic panel     Status: Abnormal   Collection Time: 04/01/16  9:11 AM  Result Value Ref Range   Sodium 137 135 - 145 mmol/L   Potassium 4.0 3.5 - 5.1 mmol/L   Chloride 102 101 - 111 mmol/L   CO2 24 22 - 32 mmol/L   Glucose, Bld 86 65 - 99 mg/dL   BUN 10 6 - 20 mg/dL   Creatinine, Ser 0.53 0.44 - 1.00 mg/dL   Calcium 8.6 (L) 8.9 - 10.3 mg/dL   Total Protein 7.5 6.5 - 8.1 g/dL   Albumin 4.4 3.5 - 5.0 g/dL   AST 26 15 - 41 U/L   ALT 19 14 - 54 U/L   Alkaline Phosphatase 93 38 - 126 U/L   Total Bilirubin 0.5 0.3 - 1.2 mg/dL   GFR calc non Af Amer >60 >60 mL/min   GFR calc Af Amer >60 >60 mL/min    Comment: (NOTE) The eGFR has been calculated using the CKD EPI equation. This calculation has not been validated in all clinical situations. eGFR's persistently <60 mL/min signify possible  Chronic Kidney Disease.    Anion gap 11 5 - 15  Ethanol     Status: Abnormal   Collection Time: 04/01/16  9:11 AM  Result Value Ref Range   Alcohol, Ethyl (B) 362 (HH) <5 mg/dL    Comment:        LOWEST DETECTABLE LIMIT FOR SERUM ALCOHOL IS 5 mg/dL FOR MEDICAL PURPOSES ONLY CRITICAL RESULT CALLED TO, READ BACK BY AND VERIFIED WITH: JANIE BOWEN'@1024'$  ON 31/51/76 BY [facility]   Salicylate level     Status: None   Collection Time: 04/01/16  9:11 AM  Result Value Ref Range   Salicylate Lvl <1.6 2.8 -  30.0 mg/dL  Acetaminophen level     Status: Abnormal   Collection Time: 04/01/16  9:11 AM  Result Value Ref Range   Acetaminophen (Tylenol), Serum <10 (L) 10 - 30 ug/mL    Comment:        THERAPEUTIC CONCENTRATIONS VARY SIGNIFICANTLY. A RANGE OF 10-30 ug/mL MAY BE AN EFFECTIVE CONCENTRATION FOR MANY PATIENTS. HOWEVER, SOME ARE BEST TREATED AT CONCENTRATIONS OUTSIDE THIS RANGE. ACETAMINOPHEN CONCENTRATIONS >150 ug/mL AT 4 HOURS AFTER INGESTION AND >50 ug/mL AT 12 HOURS AFTER INGESTION ARE OFTEN ASSOCIATED WITH TOXIC REACTIONS.   cbc     Status: None   Collection Time: 04/01/16  9:11 AM  Result Value Ref Range   WBC 8.1 3.6 - 11.0 K/uL   RBC 4.55 3.80 - 5.20 MIL/uL   Hemoglobin 14.6 12.0 - 16.0 g/dL   HCT 43.1 35.0 - 47.0 %   MCV 94.7 80.0 - 100.0 fL   MCH 32.1 26.0 - 34.0 pg   MCHC 33.9 32.0 - 36.0 g/dL   RDW 13.1 11.5 - 14.5 %   Platelets 328 150 - 440 K/uL  Urine Drug Screen, Qualitative     Status: None   Collection Time: 04/01/16  9:13 AM  Result Value Ref Range   Tricyclic, Ur Screen NONE DETECTED NONE DETECTED   Amphetamines, Ur Screen NONE DETECTED NONE DETECTED   MDMA (Ecstasy)Ur Screen NONE DETECTED NONE DETECTED   Cocaine Metabolite,Ur [place] NONE DETECTED NONE DETECTED   Opiate, Ur Screen NONE DETECTED NONE DETECTED   Phencyclidine (PCP) Ur S NONE DETECTED NONE DETECTED   Cannabinoid 50 Ng, Ur [place] NONE DETECTED NONE DETECTED   Barbiturates, Ur Screen NONE DETECTED  NONE DETECTED   Benzodiazepine, Ur Scrn NONE DETECTED NONE DETECTED   Methadone Scn, Ur NONE DETECTED NONE DETECTED    Comment: (NOTE) 681  Tricyclics, urine               Cutoff 1000 ng/mL 200  Amphetamines, urine             Cutoff 1000 ng/mL 300  MDMA (Ecstasy), urine           Cutoff 500 ng/mL 400  Cocaine Metabolite, urine       Cutoff 300 ng/mL 500  Opiate, urine                   Cutoff 300 ng/mL 600  Phencyclidine (PCP), urine      Cutoff 25 ng/mL 700  Cannabinoid, urine              Cutoff 50 ng/mL 800  Barbiturates, urine             Cutoff 200 ng/mL 900  Benzodiazepine, urine           Cutoff 200 ng/mL 1000 Methadone, urine                Cutoff 300 ng/mL 1100 1200 The urine drug screen provides only a preliminary, unconfirmed 1300 analytical test result and should not be used for non-medical 1400 purposes. Clinical consideration and professional judgment should 1500 be applied to any positive drug screen result due to possible 1600 interfering substances. A more specific alternate chemical method 1700 must be used in order to obtain a confirmed analytical result.  1800 Gas chromato graphy / mass spectrometry (GC/MS) is the preferred 1900 confirmatory method.     Blood Alcohol level:  Lab Results  Component Value Date   [facility] 362* 04/01/2016   [facility]  268* 29/51/8841    Metabolic Disorder Labs:  No results found for: HGBA1C, MPG No results found for: PROLACTIN No results found for: CHOL, TRIG, HDL, CHOLHDL, VLDL, LDLCALC  Current Medications: Current Facility-Administered Medications  Medication Dose Route Frequency Provider Last Rate Last Dose  . acetaminophen (TYLENOL) tablet 650 mg  650 mg Oral Q6H PRN Gonzella Lex, MD      . alum & mag hydroxide-simeth (MAALOX/MYLANTA) 200-200-20 MG/5ML suspension 30 mL  30 mL Oral Q4H PRN Gonzella Lex, MD      . chlordiazePOXIDE (LIBRIUM) capsule 50 mg  50 mg Oral TID Hildred Priest, MD   50 mg at 04/02/16 1208  .  hydrOXYzine (ATARAX/VISTARIL) tablet 50 mg  50 mg Oral Once Hildred Priest, MD      . LORazepam (ATIVAN) tablet 2 mg  2 mg Oral TID PRN Hildred Priest, MD      . magnesium hydroxide (MILK OF MAGNESIA) suspension 30 mL  30 mL Oral Daily PRN Gonzella Lex, MD      . multivitamin with minerals tablet 1 tablet  1 tablet Oral Daily Kerrie Buffalo, NP   1 tablet at 04/02/16 0909  . ondansetron (ZOFRAN-ODT) disintegrating tablet 4 mg  4 mg Oral Q6H PRN Kerrie Buffalo, NP      . Derrill Memo ON 04/03/2016] thiamine (VITAMIN B-1) tablet 100 mg  100 mg Oral Daily Kerrie Buffalo, NP       PTA Medications: Prescriptions prior to admission  Medication Sig Dispense Refill Last Dose  . acamprosate (CAMPRAL) 333 MG tablet Take 2 tablets (666 mg total) by mouth 3 (three) times daily with meals. (Patient not taking: Reported on 09/24/2015) 180 tablet 0 Completed Course at Unknown time  . venlafaxine XR (EFFEXOR-XR) 150 MG 24 hr capsule Take 1 capsule (150 mg total) by mouth daily with breakfast. (Patient not taking: Reported on 09/24/2015) 30 capsule 0 Completed Course at Unknown time    Musculoskeletal: Strength & Muscle Tone: within normal limits Gait & Station: normal Patient leans: N/A  Psychiatric Specialty Exam: Physical Exam  Constitutional: She is oriented to person, place, and time. She appears well-developed and well-nourished.  HENT:  Head: Normocephalic and atraumatic.  Neck: Normal range of motion.  Musculoskeletal: Normal range of motion.  Neurological: She is alert and oriented to person, place, and time.    Review of Systems  Constitutional: Negative.   HENT: Negative.   Eyes: Negative.   Respiratory: Negative.   Cardiovascular: Negative.   Gastrointestinal: Negative.   Genitourinary: Negative.   Musculoskeletal: Negative.   Skin: Negative.   Neurological: Negative.   Endo/Heme/Allergies: Negative.   Psychiatric/Behavioral: Positive for depression and substance  abuse. The patient is nervous/anxious.     Blood pressure 121/81, pulse 85, temperature 98.2 F (36.8 C), temperature source Oral, resp. rate 18, height '5\' 5"'$  (1.651 m), weight 67.994 kg (149 lb 14.4 oz), SpO2 99 %.Body mass index is 24.94 kg/(m^2).  General Appearance: Well Groomed  Eye Contact:  Good  Speech:  Clear and Coherent  Volume:  Normal  Mood:  Euthymic  Affect:  Appropriate  Thought Process:  Linear and Descriptions of Associations: Intact  Orientation:  Full (Time, Place, and Person)  Thought Content:  Logical  Suicidal Thoughts:  No  Homicidal Thoughts:  No  Memory:  Immediate;   Good Recent;   Good Remote;   Good  Judgement:  Fair  Insight:  Fair  Psychomotor Activity:  Normal  Concentration:  Concentration: Fair and Attention  Span: Fair  Recall:  [employer] of Knowledge:  Good  Language:  Good  Akathisia:  No  Handed:    AIMS (if indicated):     Assets:  Communication Skills Housing  ADL's:  Intact  Cognition:  WNL  Sleep:        Treatment Plan Summary:  Alcohol induced depressive d/o: currently denies depression.  "It was the alcohol talking".  Says she is not on any medications and is not f/u with a psychiatrist.  Alcohol withdrawal: will order librium 50 mg tid and ativan 2 mg q 8 h prn for CIWA >15.  CIWA will be checked q 8 h along with VS.  Pt on thiamine and MVT.  Patient clearly showing signs of withdrawal, see what was 11 this morning in her heart rate was 112.  Alcohol dep: will try to encourage pt to go to residential. Pt is interested on going to [facility] (there is a 30 d wait list).  Pt is requesting antabuse at discharge.   Nausea: continue zofran prn  Precautions: q 75 m checks and also withdrawal precautions  Diet: regular  Hospitalization status IVC  Dispo: will d/c back to her home  F/u: will schedule her to f/u with [facility].  Will refer to [facility] pt aware there is a waiting list  I certify that inpatient services furnished can reasonably  be expected to improve the patient's condition.    Hildred Priest, MD 5/23/20172:01 PM

## 2016-04-02 NOTE — [facility] Group Notes (Signed)
[facility] Group Notes:  (Nursing/MHT/Case Management/Adjunct)  Date:  04/02/2016  Time:  2:33 PM  Type of Therapy:  Psychoeducational Skills  Participation Level:  Did Not Attend   Patty Cruz 04/02/2016, 2:33 PM

## 2016-04-02 NOTE — Progress Notes (Signed)
Recreation Therapy Notes  Date: 05.23.17 Time: 9:30 am Location: Craft Room  Group Topic: Goal Setting  Goal Area(s) Addresses:  Patient will write at least one goal. Patient will write at least one obstacle.  Behavioral Response: Attentive, Interactive  Intervention: Recovery Goal Chart  Activity: Patients were instructed to make a recovery goal chart including goals towards their recovery, obstacles, the date they started working on their goal, and the date they achieved their goal.  Education: LRT educated patients on ways they can celebrate achieving their goals.  Education Outcome: Acknowledges education/In group clarification offered  Clinical Observations/Feedback: Patient completed activity by writing down goals and obstacles. Patient contributed to group discussion by stating how she can use her support system to help her achieve her goals, and some ways she can celebrate achieving her goals.  Jacquelynn CreeGreene,Shaylyn Bawa M, LRT/CTRS 04/02/2016 12:21 PM

## 2016-04-02 NOTE — [facility] Group Notes (Signed)
[facility] LCSW Group Therapy   04/02/2016 11:00 am  Type of Therapy: Group Therapy   Participation Level: Active   Participation Quality: Attentive, Sharing and Supportive   Affect: Appropriate  Cognitive: Alert and Oriented   Insight: Developing/Improving and Engaged   Engagement in Therapy: Developing/Improving and Engaged   Modes of Intervention: Clarification, Confrontation, Discussion, Education, Exploration,  Limit-setting, Orientation, Problem-solving, Rapport Building, Dance movement psychotherapisteality Testing, Socialization and Support  Summary of Progress/Problems: The topic for group therapy was feelings about diagnosis. Pt actively participated in group discussion on their past and current diagnosis and how they feel towards this. Pt also identified how society and family members judge them, based on their diagnosis as well as stereotypes and stigmas. Pt reported that acceptance of the pt's diagnosis was easy and straightforward that the pt now believes that a bettr life through sobriety will assist her in being a better grandmother and mother and having am better life with all will be a result of making a difficult time useful as long as the pt uses medication management, therapy and the pt's support system, which the pt lists as 12-step programs, in assisting the pt in treating the symptoms of the pt's diagnosis of substance use disorder.  Pt was polite and cooperative with the CSW and other group members and focused and attentive to the topics discussed and the sharing of others.     Dorothe PeaJonathan F. Kitrina Maurin, LCSWA, LCAS  04/02/16

## 2016-04-02 NOTE — Progress Notes (Signed)
Pt pleasant and cooperative. Reports having shakes and mild anxiety, controlled with scheduled medications. Denies SI, HI, AVH. Pt in bed resting. Pt reports being dizzy at times. Encouraged pt to call for any assistance needed. CIWA=4. No medication needed. Pt med and group compliant. Appropriate with staff and peers. No compliants of nausea. Able to tolerate meals. Encouragement and support offered. Pt receptive and remains safe on unit with q 15 min checks.

## 2016-04-02 NOTE — Tx Team (Signed)
Initial Interdisciplinary Treatment Plan   PATIENT STRESSORS:  alcohol abuse withdrawals   PATIENT STRENGTHS: Ability for insight Communication skills Motivation for treatment/growth   PROBLEM LIST: Problem List/Patient Goals Date to be addressed Date deferred Reason deferred Estimated date of resolution  Alcohol abuse 04/01/16   04/10/16  Withdrawal symptoms 04/01/16   04/10/16  "to be successful with withdrawal from alcohol"    04/10/16  "to abstain from alcohol when in home environment"    04/10/16                                 DISCHARGE CRITERIA:  Ability to meet basic life and health needs Motivation to continue treatment in a less acute level of care Verbal commitment to aftercare and medication compliance Withdrawal symptoms are absent or subacute and managed without 24-hour nursing intervention  PRELIMINARY DISCHARGE PLAN: Attend aftercare/continuing care group Outpatient therapy  PATIENT/FAMIILY INVOLVEMENT: This treatment plan has been presented to and reviewed with the patient, Patty LobeJane S Cruz, and/or family member, .  The patient and family have been given the opportunity to ask questions and make suggestions.  Hillery JacksChristine W Calab Sachse 04/02/2016, 1:09 AM

## 2016-04-02 NOTE — Plan of Care (Signed)
Problem: Coping: Goal: Ability to verbalize feelings will improve Outcome: Progressing Patient able to verbalize feelings   

## 2016-04-03 MED ORDER — MIRTAZAPINE 15 MG PO TABS
7.5000 mg | ORAL_TABLET | Freq: Every day | ORAL | Status: DC
  Administered 2016-04-03 – 2016-04-04 (×2): 7.5 mg via ORAL
  Filled 2016-04-03 (×2): qty 1

## 2016-04-03 MED ORDER — HYDROXYZINE HCL 50 MG PO TABS
50.0000 mg | ORAL_TABLET | Freq: Every day | ORAL | Status: DC
  Administered 2016-04-03: 50 mg via ORAL
  Filled 2016-04-03: qty 1

## 2016-04-03 MED ORDER — ENSURE ENLIVE PO LIQD
237.0000 mL | Freq: Three times a day (TID) | ORAL | Status: DC
  Administered 2016-04-03 – 2016-04-05 (×7): 237 mL via ORAL

## 2016-04-03 NOTE — [facility] Group Notes (Signed)
[facility] Group Notes:  (Nursing/MHT/Case Management/Adjunct)  Date:  04/03/2016  Time:  10:12 PM  Type of Therapy:  Group Therapy  Participation Level:  Did Not Attend    Summary of Progress/Problems:  Patty Cruz 04/03/2016, 10:12 PM

## 2016-04-03 NOTE — Progress Notes (Signed)
Patient in room most of day sleeping. Comes out for meals. Reports constipated, MOM given, awaiting results. Pt med compliant. Pt reports not feeling well today. Pt has been attending and participating in groups. Encouragement and support offered. Pt receptive and remains safe with q 15 min checks.

## 2016-04-03 NOTE — Progress Notes (Signed)
[facility]  04/03/2016 1:51 PM Patty Cruz  MRN:  [id number] Subjective:  Patient appears quite depressed this morning. Mood is depressed, appetite is decreased, energy is low, sleep is poor. She denies suicidality or homicidality. She tells me her pastor called her last night and told her that there is a bed available for her at [facility].  patient doesn't really want to go but she feels she will disappoint him and her family if she doesn't go. She thinks she should be okay going home just with the anti-abuse.  Patient tells me she was unable to sleep last night thinking about her options. This morning she feels very tired.   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 Alcohol use disorder, severe, dependence (HCC) [F10.20] 04/02/2016  . Alcohol withdrawal (HCC) [F10.239] 04/02/2016  . 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 Suicidal ideation [R45.851] 04/01/2016   Total Time spent with patient: 30 minutes  Past Psychiatric History:   Past Medical History:  Past Medical History  Diagnosis Date  . Alcohol abuse   . Medical history non-contributory   . Depression   . Anxiety     Past Surgical History  Procedure Laterality Date  . Cesarean section  1991  . Wrist surgery Right   . No past surgeries     Family History: History reviewed. No pertinent family history. Family Psychiatric  History: Social History:  History  Alcohol Use  . Yes    Comment: [place] quantity     History  Drug Use No    Social History   Social History  . Marital Status: Divorced    Spouse Name: N/A  . Number of Children: N/A  . Years of Education: N/A   Social History Main Topics  . Smoking status: Never Smoker   . Smokeless tobacco: Never Used  . Alcohol  Use: Yes     Comment: [place] quantity  . Drug Use: No  . Sexual Activity: No   Other Topics Concern  . None   Social History Narrative     Current Medications: Current Facility-Administered Medications  Medication Dose Route Frequency Provider Last Rate Last Dose  . acetaminophen (TYLENOL) tablet 650 mg  650 mg Oral Q6H PRN Audery AmelJohn T Clapacs, MD      . alum & mag hydroxide-simeth (MAALOX/MYLANTA) 200-200-20 MG/5ML suspension 30 mL  30 mL Oral Q4H PRN Audery AmelJohn T Clapacs, MD      . chlordiazePOXIDE (LIBRIUM) capsule 50 mg  50 mg Oral TID Jimmy FootmanAndrea Hernandez-Gonzalez, MD   50 mg at 04/03/16 0844  . feeding supplement (ENSURE ENLIVE) (ENSURE ENLIVE) liquid 237 mL  237 mL Oral TID BM Jimmy FootmanAndrea Hernandez-Gonzalez, MD   237 mL at 04/03/16 1130  . hydrOXYzine (ATARAX/VISTARIL) tablet 50 mg  50 mg Oral QHS Jimmy FootmanAndrea Hernandez-Gonzalez, MD      . magnesium hydroxide (MILK OF MAGNESIA) suspension 30 mL  30 mL Oral Daily PRN Audery AmelJohn T Clapacs, MD      . mirtazapine (REMERON) tablet 7.5 mg  7.5 mg Oral QHS Jimmy FootmanAndrea Hernandez-Gonzalez, MD      . multivitamin with minerals tablet 1 tablet  1 tablet Oral Daily Adonis BrookSheila Agustin, NP   1 tablet at 04/03/16 0844  . ondansetron (ZOFRAN-ODT) disintegrating tablet 4 mg  4 mg Oral Q6H PRN Adonis BrookSheila Agustin,  NP        Lab Results: No results found for this or any previous visit (from the past 48 hour(s)).  Blood Alcohol level:  Lab Results  Component Value Date   ETH 362* 04/01/2016   ETH 268* 03/29/2016    Physical Findings: AIMS: Facial and Oral Movements Muscles of Facial Expression: None, normal Lips and Perioral Area: None, normal Jaw: None, normal Tongue: None, normal,Extremity Movements Upper (arms, wrists, hands, fingers): None, normal Lower (legs, knees, ankles, toes): None, normal, Trunk Movements Neck, shoulders, hips: None, normal, Overall Severity Severity of abnormal movements (highest score from questions above): None, normal Incapacitation due to abnormal  movements: None, normal Patient's awareness of abnormal movements (rate only patient's report): No Awareness, Dental Status Current problems with teeth and/or dentures?: No Does patient usually wear dentures?: No  CIWA:  CIWA-Ar Total: 5 COWS:     Musculoskeletal: Strength & Muscle Tone: within normal limits Gait & Station: normal Patient leans: N/A  Psychiatric Specialty Exam: Physical Exam  Constitutional: She is oriented to person, place, and time. She appears well-developed and well-nourished.  HENT:  Head: Normocephalic and atraumatic.  Eyes: Conjunctivae are normal.  Neck: Normal range of motion.  Respiratory: Effort normal.  Musculoskeletal: Normal range of motion.  Neurological: She is alert and oriented to person, place, and time.    Review of Systems  Constitutional: Negative.   HENT: Negative.   Eyes: Negative.   Respiratory: Negative.   Cardiovascular: Negative.   Gastrointestinal: Negative.   Genitourinary: Negative.   Musculoskeletal: Negative.   Skin: Negative.   Neurological: Negative.   Endo/Heme/Allergies: Negative.   Psychiatric/Behavioral: Positive for depression and substance abuse. The patient is nervous/anxious and has insomnia.     Blood pressure 120/77, pulse 84, temperature 97.8 F (36.6 C), temperature source Oral, resp. rate 18, height  (1.651 m), weight 67.994 kg (149 lb 14.4 oz), SpO2 99 %.Body mass index is 24.94 kg/(m^2).  General Appearance: Well Groomed  Eye Contact:  Good  Speech:  Normal Rate  Volume:  Decreased  Mood:  Dysphoric  Affect:  Blunt  Thought Process:  Linear and Descriptions of Associations: Intact  Orientation:  Full (Time, Place, and Person)  Thought Content:  Hallucinations: None  Suicidal Thoughts:  No  Homicidal Thoughts:  No  Memory:  Immediate;   Fair Recent;   Fair Remote;   Fair  Judgement:  Poor  Insight:  Shallow  Psychomotor Activity:  Decreased  Concentration:  Concentration: Fair and Attention  Span: Fair  Recall:  [employer] of Knowledge:  Fair  Language:  Fair  Akathisia:  No  Handed:    AIMS (if indicated):     Assets:  Manufacturing systems engineer Housing  ADL's:  Intact  Cognition:  WNL  Sleep:  Number of Hours: 7.5     Treatment Plan Summary: Daily contact with patient to assess and evaluate symptoms and progress in treatment and Medication management   R/o depression: Patient complains of dysphoria today. Says that last night she will unable to fall asleep and she is wondering about her situation.  Says that her pastor found her a bed at [facility] ([facility]) but she isn't sure if she should go or not.   Mrs. Spoonis usually never inclined to go to inpatient treatment. She tells me if she goes to [facility] is only not to disappoint her family  Alcohol induced depressive d/o: currently denies depression. "It was the alcohol talking". Says she is not  on any medications and is not f/u with a psychiatrist.  Alcohol withdrawal: continue librium 50 mg tid.  Continue multivitamins 1 tab by mouth daily.  Vital signs are stable this morning  Alcohol dep: will try to encourage pt to go to residential. Pt ambivalent about  going to [facility]. Pt is requesting antabuse at discharge.   Nausea: continue zofran prn  Precautions: q 15 m checks and also withdrawal precautions  Diet: regular  Hospitalization status IVC  Dispo: will d/c back to her home once stable  F/u: To be determined.  I will ask the social worker to look into the situation at [facility].  If there is a bed available I will strongly encourage her to go as she has been in our emergency department 3 times over the last month.  Possible discharge in the next 24-48 hours.  Jimmy Footman, MD 04/03/2016, 1:51 PM

## 2016-04-03 NOTE — Progress Notes (Signed)
Recreation Therapy Notes  Date: 05.24.17 Time: 1:00 pm Location: Craft Room  Group Topic: Self-esteem  Goal Area(s) Addresses:  Patient will write at least one positive trait. Patient will verbalize benefit of having healthy self-esteem.  Behavioral Response: Attentive, Interactive  Intervention: I Am  Activity: Patients were given a worksheet with the letter I on it and instructed to write as many positive traits inside the letter.  Education: LRT educated patients on ways they can increase their self-esteem.  Education Outcome: Acknowledges education/In group clarification offered  Clinical Observations/Feedback: Patient completed activity by writing positive traits. Patient contributed to group discussion by stating that it was difficult to think of positive traits at first, but it got easier, and how her self-esteem affects her.  Jacquelynn CreeGreene,Iyonna Rish M, LRT/CTRS 04/03/2016 2:18 PM

## 2016-04-03 NOTE — Plan of Care (Signed)
Problem: [facility] Participation in Recreation Therapeutic Interventions Goal: STG-Patient will identify at least five coping skills for ** STG: Coping Skills - Within 4 treatment sessions, patient will verbalize at least 5 coping skills for substance abuse in each of 2 treatment sessions to decrease substance abuse post d/c.  Outcome: Progressing Treatment Session 1; Completed 1 out of 2: At approximately 11:00 am, LRT met with patient in patient room. Patient verbalized 5 coping skills for substance abuse. LRT educated patient on leisure and why it is important to implement it into her schedule. LRT educated and provided patient with blank schedules to help her plan her day and try to avoid using substances. LRT educated patient on healthy support systems.  Leonette Monarch, LRT/CTRS 05.24.17 2:22 pm Goal: STG-Other Recreation Therapy Goal (Specify) STG: Stress Management - Within 4 treatment sessions, patient will verbalize understanding of the stress management techniques in each of 2 treatment sessions to increase stress management skills post d/c.  Outcome: Progressing Treatment Session 1; Completed 1 out 2: At approximately 11:00 am, LRT met with patient in patient room. LRT educated and provided patient with handouts on stress management techniques. Patient verbalized understanding. LRT encouraged patient to read over and practice the stress management techniques.  Leonette Monarch, LRT/CTRS 05.24.17 2:24 pm

## 2016-04-03 NOTE — [facility] Group Notes (Signed)
[facility] Group Notes:  (Nursing/MHT/Case Management/Adjunct)  Date:  04/03/2016  Time:  6:55 PM  Type of Therapy:  Group Therapy  Participation Level:  Did Not Attend  Participation Quality:    Mayra NeerJackie L Zeya Balles 04/03/2016, 6:55 PM

## 2016-04-03 NOTE — Plan of Care (Signed)
Problem: Medication: Goal: Compliance with prescribed medication regimen will improve Outcome: Progressing Pt taking medications as prescribed.  Problem: Safety: Goal: Periods of time without injury will increase Outcome: Progressing Pt remains free from harm.

## 2016-04-03 NOTE — [facility] Group Notes (Signed)
[facility] LCSW Group Therapy   04/03/2016  9:30am   Type of Therapy: Group Therapy   Participation Level: Active   Participation Quality: Attentive, Sharing and Supportive   Affect: Depressed and Flat   Cognitive: Alert and Oriented   Insight: Developing/Improving and Engaged   Engagement in Therapy: Developing/Improving and Engaged   Modes of Intervention: Clarification, Confrontation, Discussion, Education, Exploration, Limit-setting, Orientation, Problem-solving, Rapport Building, Dance movement psychotherapisteality Testing, Socialization and Support   Summary of Progress/Problems: The topic for group today was emotional regulation. This group focused on both positive and negative emotion identification and allowed group members to process ways to identify feelings, regulate negative emotions, and find healthy ways to manage internal/external emotions. Group members were asked to reflect on a time when their reaction to an emotion led to a negative outcome and explored how alternative responses using emotion regulation would have benefited them. Group members were also asked to discuss a time when emotion regulation was utilized when a negative emotion was experienced. Pt shared the pt's difficulty in the past with regulating the pt's emotions without the use of alcohol and how this inability to maintain emotional stability had affected the pt's relationships with family members and also shared how alcohol had further affected the pt's ability to maintain stability in regards to the pt's relationships with her grandchildren in particular. Pt shared that in the future the pt will seek to utilize medication management, substance abuse treatment and therapy to assist in regulating the pt's emotions, preferably in a long-term treatment facility. Pt was polite and cooperative with the CSW and other group members and focused and attentive to the topics discussed and the sharing of others.    Dorothe PeaJonathan F. Eliu Batch, MSW, LCSWA,  LCAS

## 2016-04-03 NOTE — Plan of Care (Signed)
Problem: Health Behavior/Discharge Planning: Goal: Identification of resources available to assist in meeting health care needs will improve Outcome: Progressing Pt discusses with writer the possibility of going to a [facility]. She verbalizes acceptance that "that is where I need to be."  Problem: Safety: Goal: Ability to remain free from injury will improve Outcome: Progressing Pt remains free from harm.

## 2016-04-03 NOTE — Progress Notes (Signed)
D: Pt is seen laying in bed most of this evening. She reports that she attended groups today and "found them very informative. It was nice to hear other people's point of view." Pt reports that her goal today was "to stay sober." She denies withdrawal symptoms at this time. Pt does not appear to have tremors, but she does report that she gets "lightheaded" when she stands up quickly. Pt appears anxious. She rates anxiety 2/10. Denies SI/HI/AVH at this time. Denies pain. Pt c/o restlessness and is given PRN ativan with relief.  A: Emotional support and encouragement provided. Medications administered with education. q15 minute safety checks maintained. R: Pt remains free from harm. Will continue to monitor.

## 2016-04-04 MED ORDER — CHLORDIAZEPOXIDE HCL 25 MG PO CAPS: 25.0000 mg | ORAL_CAPSULE | Freq: Three times a day (TID) | ORAL | Status: DC

## 2016-04-04 MED ORDER — HYDROXYZINE HCL 50 MG PO TABS: 50.0000 mg | ORAL_TABLET | Freq: Every day | ORAL | Status: DC

## 2016-04-04 MED ORDER — DISULFIRAM 250 MG PO TABS: 250.0000 mg | ORAL_TABLET | Freq: Every day | ORAL | Status: DC

## 2016-04-04 NOTE — Progress Notes (Signed)
Pt with brighter affect today. Out in mileu. Med and group compliant. No complaints voiced. Pt continues to endorse depression but denies SI, HI, AVH. Encouragement and support offered. Pt receptive and remains safe on unit with q 15 min checks.

## 2016-04-04 NOTE — Progress Notes (Signed)
D: Pt remains in her room most of the evening sleeping. She does come out for snacks and medications. Pt reports that she is anxious about where she is going to go after discharge and the plans she has to put in place. Pt discusses with Clinical research associatewriter the possibility of going to a residential treatment center and states "that is what I need right now." She rates depression 5/10. Denies SI/HI/AVH at this time, stating "I have too much to live for." Denies pain. Pt reports that MOM given earlier in the day worked. A: Emotional support and encouragement provided. Medications administered with education. q15 minute safety checks maintained. R: Pt remains free from harm. Will continue to monitor.

## 2016-04-04 NOTE — Plan of Care (Signed)
Problem: Education: Goal: Knowledge of the prescribed therapeutic regimen will improve Outcome: Progressing Patient reports feeling better

## 2016-04-04 NOTE — [facility] Counselor (Signed)
Adult Comprehensive Assessment  Patient ID: Patty Cruz, female   DOB: 06-18-1956, 60 y.o.   MRN: [id number]  Information Source: Information source: Patient  Current Stressors:  Educational / Learning stressors: None reported  Employment / Job issues: Unemployed since Feb 2017.  Family Relationships: Strained relationship with family.  Financial / Lack of resources (include bankruptcy): No income, only income inheretince is from father.  Housing / Lack of housing: Pt recently moved.  Physical health (include injuries & life threatening diseases): None reported  Social relationships: None reported  Substance abuse: Pt reports drinking 1/2 gallon of wine per day.   Living/Environment/Situation:  Living Arrangements: Alone Living conditions (as described by patient or guardian): Pt is excited about her move.  How long has patient lived in current situation?: In the process of moving.  What is atmosphere in current home: Comfortable  Family History:  Marital status: Divorced Divorced, when?: 17 years  What types of issues is patient dealing with in the relationship?: "We were suited for each other"  Are you sexually active?: No What is your sexual orientation?: Heterosexual  Has your sexual activity been affected by drugs, alcohol, medication, or emotional stress?: NA Does patient have children?: Yes How is patient's relationship with their children?: one son [phone]; daughter [phone]; daughter 24-strained relationship recently due to relapse   Childhood History:  By whom was/is the patient raised?: Both parents Additional childhood history information: parents were married. no s/a or mental health issues with parents. close and loving family. no abuse reported Description of patient's relationship with caregiver when they were a child: close to both parents Patient's description of current relationship with people who raised him/her: Parents passed away.  How were you disciplined when you  got in trouble as a child/adolescent?: None reported  Does patient have siblings?: Yes Description of patient's current relationship with siblings: brother died in Nov 2015 due to alcohol related health problems. close to surviving siblings (brother and sister). neither have problems with MH or S/A.  Did patient suffer any verbal/emotional/physical/sexual abuse as a child?: No Did patient suffer from severe childhood neglect?: No Has patient ever been sexually abused/assaulted/raped as an adolescent or adult?: No Was the patient ever a victim of a crime or a disaster?: No Witnessed domestic violence?: No  Education:  Highest grade of school patient has completed: OncologistBachelor's Degree Currently a student?: No Learning disability?: No  Employment/Work Situation:   Employment situation: Unemployed Patient's job has been impacted by current illness: No What is the longest time patient has a held a job?: 10 years 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 Alcohol/Substance Abuse Treatment Hx: Past Tx, Inpatient, Past Tx, Outpatient, Past detox, Attends AA/NA If yes, describe treatment: [facility], [facility], AA meetings 4x per day, pt has sponsor Has alcohol/substance abuse ever caused legal problems?: No  Social Support System:   Patient's Community Support System: Fair Museum/gallery exhibitions officerDescribe Community Support System: family  Type of faith/religion: Christainity  How does patient's faith help to cope with current illness?: prayer   Leisure/Recreation:  Leisure  and Hobbies: aerobics; walking, reading, cooking, spending time with my kids.   Strengths/Needs:   What things does the patient do well?: caring, good with the public  In what areas does patient struggle / problems for patient: alcohol use   Discharge Plan:   Does patient have access to transportation?: Yes Will patient be returning to same living situation after discharge?: Yes Currently receiving community mental health services: No If no, would patient like referral for services when discharged?: Yes (What [place] Does patient have financial barriers related to discharge medications?: Yes Patient description of barriers related to discharge medications: No income, no insurance   Summary/Recommendations:    Patient is a 60 year old female admitted  with a diagnosis of Alcohol induced depressive disorder. Patient presented to the hospital with depression, SI and substance abuse. Patient reports primary triggers for admission were recent relapse. Patient will benefit from crisis stabilization, medication evaluation, group therapy and psycho education in addition to case management for discharge. At discharge, it is recommended that patient remain compliant with established discharge plan and continued treatment.   Patty Cruz L Patty Cruz. MSW, [facility]  04/04/2016

## 2016-04-04 NOTE — Progress Notes (Signed)
[facility]  04/04/2016 1:21 PM Patty Cruz  MRN:  [id number] Subjective:  Patient appears quite depressed this morning. Mood is depressed, appetite is decreased, energy is low, sleep is fair. She denies suicidality or homicidality. SW contacted [facility] today and the bed that her pastor said was available for her was for detox and not for residential.  Pt appears to still be withdrawing from alcohol.  She appears weak and has not been eating or sleeping much.  Pt has been withdrawn to room. (this usually is not who she has presented in the past)   Per nursing: D: Pt remains in her room most of the evening sleeping. She does come out for snacks and medications. Pt reports that she is anxious about where she is going to go after discharge and the plans she has to put in place. Pt discusses with Clinical research associatewriter the possibility of going to a residential treatment center and states "that is what I need right now." She rates depression 5/10. Denies SI/HI/AVH at this time, stating "I have too much to live for." Denies pain.          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 Alcohol use disorder, severe, dependence (HCC) [F10.20] 04/02/2016  . Alcohol withdrawal (HCC) [F10.239] 04/02/2016  . 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 Suicidal ideation [R45.851] 04/01/2016   Total Time spent with patient: 30 minutes  Past Psychiatric History:   Past Medical History:  Past Medical History  Diagnosis Date  . Alcohol abuse   . Medical history non-contributory   . Depression   . Anxiety     Past Surgical History  Procedure Laterality Date  . Cesarean section  1991  . Wrist surgery Right   . No past surgeries     Family History: History reviewed. No pertinent  family history. Family Psychiatric  History: Social History:  History  Alcohol Use  . Yes    Comment: [place] quantity     History  Drug Use No    Social History   Social History  . Marital Status: Divorced    Spouse Name: N/A  . Number of Children: N/A  . Years of Education: N/A   Social History Main Topics  . Smoking status: Never Smoker   . Smokeless tobacco: Never Used  . Alcohol Use: Yes     Comment: [place] quantity  . Drug Use: No  . Sexual Activity: No   Other Topics Concern  . None   Social History Narrative     Current Medications: Current Facility-Administered Medications  Medication Dose Route Frequency Provider Last Rate Last Dose  . acetaminophen (TYLENOL) tablet 650 mg  650 mg Oral Q6H PRN Audery AmelJohn T Clapacs, MD      . alum & mag hydroxide-simeth (MAALOX/MYLANTA) 200-200-20 MG/5ML suspension 30 mL  30 mL Oral Q4H PRN Audery AmelJohn T Clapacs, MD      . chlordiazePOXIDE (LIBRIUM) capsule 50 mg  50 mg Oral TID Jimmy FootmanAndrea Hernandez-Gonzalez, MD   50 mg at 04/04/16 [id number]  . feeding supplement (ENSURE ENLIVE) (ENSURE ENLIVE) liquid 237 mL  237 mL Oral TID BM Jimmy FootmanAndrea Hernandez-Gonzalez, MD   237 mL at 04/04/16 1000  . magnesium hydroxide (MILK OF MAGNESIA) suspension 30 mL  30 mL Oral Daily PRN Audery AmelJohn T Clapacs, MD  30 mL at 04/03/16 1603  . mirtazapine (REMERON) tablet 7.5 mg  7.5 mg Oral QHS Jimmy Footman, MD   7.5 mg at 04/03/16 2130  . multivitamin with minerals tablet 1 tablet  1 tablet Oral Daily Adonis Brook, NP   1 tablet at 04/04/16 [phone]  . ondansetron (ZOFRAN-ODT) disintegrating tablet 4 mg  4 mg Oral Q6H PRN Adonis Brook, NP        Lab Results: No results found for this or any previous visit (from the past 48 hour(s)).  Blood Alcohol level:  Lab Results  Component Value Date   ETH 362* 04/01/2016   ETH 268* 03/29/2016    Physical Findings: AIMS: Facial and Oral Movements Muscles of Facial Expression: None, normal Lips and Perioral Area: None,  normal Jaw: None, normal Tongue: None, normal,Extremity Movements Upper (arms, wrists, hands, fingers): None, normal Lower (legs, knees, ankles, toes): None, normal, Trunk Movements Neck, shoulders, hips: None, normal, Overall Severity Severity of abnormal movements (highest score from questions above): None, normal Incapacitation due to abnormal movements: None, normal Patient's awareness of abnormal movements (rate only patient's report): No Awareness, Dental Status Current problems with teeth and/or dentures?: No Does patient usually wear dentures?: No  CIWA:  CIWA-Ar Total: 5 COWS:     Musculoskeletal: Strength & Muscle Tone: within normal limits Gait & Station: normal Patient leans: N/A  Psychiatric Specialty Exam: Physical Exam  Constitutional: She is oriented to person, place, and time. She appears well-developed and well-nourished.  HENT:  Head: Normocephalic and atraumatic.  Eyes: Conjunctivae are normal.  Neck: Normal range of motion.  Respiratory: Effort normal.  Musculoskeletal: Normal range of motion.  Neurological: She is alert and oriented to person, place, and time.    Review of Systems  Constitutional: Negative.   HENT: Negative.   Eyes: Negative.   Respiratory: Negative.   Cardiovascular: Negative.   Gastrointestinal: Negative.   Genitourinary: Negative.   Musculoskeletal: Negative.   Skin: Negative.   Neurological: Negative.   Endo/Heme/Allergies: Negative.   Psychiatric/Behavioral: Positive for depression and substance abuse. The patient is nervous/anxious and has insomnia.     Blood pressure 111/78, pulse 99, temperature 98.1 F (36.7 C), temperature source Oral, resp. rate 18, height  (1.651 m), weight 67.994 kg (149 lb 14.4 oz), SpO2 99 %.Body mass index is 24.94 kg/(m^2).  General Appearance: Well Groomed  Eye Contact:  Good  Speech:  Normal Rate  Volume:  Decreased  Mood:  Dysphoric  Affect:  Blunt  Thought Process:  Linear and  Descriptions of Associations: Intact  Orientation:  Full (Time, Place, and Person)  Thought Content:  Hallucinations: None  Suicidal Thoughts:  No  Homicidal Thoughts:  No  Memory:  Immediate;   Fair Recent;   Fair Remote;   Fair  Judgement:  Poor  Insight:  Shallow  Psychomotor Activity:  Decreased  Concentration:  Concentration: Fair and Attention Span: Fair  Recall:  [employer] of Knowledge:  Fair  Language:  Fair  Akathisia:  No  Handed:    AIMS (if indicated):     Assets:  Manufacturing systems engineer Housing  ADL's:  Intact  Cognition:  WNL  Sleep:  Number of Hours: 6.75     Treatment Plan Summary: Daily contact with patient to assess and evaluate symptoms and progress in treatment and Medication management    Alcohol induced depressive d/o: currently denies depression. "It was the alcohol talking". Says she is not on any medications and is not f/u with a  psychiatrist.  Pt says she slept better last night but woke up a few time with diaphoresis.   Insomnia: continue remeron 7.5 mg po qhs  Alcohol withdrawal: continue librium 50 mg tid--continue same dose of lithium for now.  Continue multivitamins 1 tab by mouth daily.  Vital signs are stable this morning.  Not sleeping well, feels weak, poor appetite.   Alcohol dep: pt will be refer to intensive outpt substance abuse upon discharge. Will prescribe her with antabuse.  Nausea: continue zofran prn  Precautions: q 15 m checks and also withdrawal precautions  Diet: regular  Hospitalization status IVC  Dispo: will d/c back to her home once stable  F/u: To be determined.  Possible discharge tomorrow  Jimmy Footman, MD 04/04/2016, 1:21 PM

## 2016-04-04 NOTE — [facility] Group Notes (Signed)
[facility] Group Notes:  (Nursing/MHT/Case Management/Adjunct)  Date:  04/04/2016  Time:  5:18 PM  Type of Therapy:  Psychoeducational Skills  Participation Level:  Active  Participation Quality:  Appropriate and Attentive  Affect:  Appropriate  Cognitive:  Appropriate  Insight:  Appropriate  Engagement in Group:  Supportive  Modes of Intervention:  Discussion, Education and Exploration  Summary of Progress/Problems:  Twanna Hymanda C Berenice Oehlert 04/04/2016, 5:18 PM

## 2016-04-04 NOTE — Progress Notes (Signed)
Recreation Therapy Notes  Date: 05.25.17 Time: 1:00 pm Location: Craft Room  Group Topic: Leisure Education  Goal Area(s) Addresses:  Patient will identify activities for each letter of the alphabet. Patient will verbalize ability to integrate positive leisure into life post d/c. Patient will verbalize ability to use leisure as a Associate Professorcoping skill.  Behavioral Response: Attentive, Interactive  Intervention: Leisure Alphabet  Activity: Patients were given a Leisure Information systems managerAlphabet worksheet and instructed to list healthy leisure activities for each letter.  Education: LRT educated patients on what they need to participate in leisure.  Education Outcome: Acknowledges education/In group clarification offered   Clinical Observations/Feedback: Patient completed activity by writing healthy leisure activities. Patient contributed to group discussion by stating some of the healthy leisure activities, what she needs to participate in leisure, and how she can integrate leisure in her schedule.  Jacquelynn CreeGreene,Toluwanimi Radebaugh M, LRT/CTRS 04/04/2016 3:24 PM

## 2016-04-04 NOTE — Plan of Care (Signed)
Problem: [facility] Participation in Recreation Therapeutic Interventions Goal: STG-Patient will identify at least five coping skills for ** STG: Coping Skills - Within 4 treatment sessions, patient will verbalize at least 5 coping skills for substance abuse in each of 2 treatment sessions to decrease substance abuse post d/c.  Outcome: Completed/Met Date Met:  04/04/16 Treatment Session 2; Completed 2 out of 2: At approximately 11:25 am, LRT met with patient in patient room. Patient verbalized 5 coping skills for substance abuse. LRT encouraged patient to participate in healthy leisure activities.  Leonette Monarch, LRT/CTRS 05.25.17 12:29 pm Goal: STG-Other Recreation Therapy Goal (Specify) STG: Stress Management - Within 4 treatment sessions, patient will verbalize understanding of the stress management techniques in each of 2 treatment sessions to increase stress management skills post d/c.  Outcome: Not Progressing Treatment Session 2; Completed 1 out of 2: At approximately 11:25 am, LRT met with patient in patient room. Patient reported she had not read over the stress management techniques. LRT encouraged patient to read over and practice the stress management techniques.  Leonette Monarch, LRT/CTRS 05.25.17 12:31 pm

## 2016-04-04 NOTE — Tx Team (Signed)
Interdisciplinary Treatment Plan Update (Adult)  Date:  04/04/2016 Time Reviewed:  2:34 PM  Progress in Treatment: Attending groups: No. Participating in groups:  No. Taking medication as prescribed:  Yes. Tolerating medication:  Yes. Family/Significant othe contact made:  No, will contact:  brother  Patient understands diagnosis:  No. and As evidenced by:  Limited insight  Discussing patient identified problems/goals with staff:  No. Medical problems stabilized or resolved:  Yes. Denies suicidal/homicidal ideation: Yes. Issues/concerns per patient self-inventory:  Yes. Other:  New problem(s) identified: No, Describe:  NA  Discharge Plan or Barriers: Pt plans to return home and follow up with outpatient.    Reason for Continuation of Hospitalization: Anxiety Depression Medication stabilization Suicidal ideation Withdrawal symptoms  Comments: Patty Cruz is a 60 y.o. female with a history of alcohol dependence, alcohol-induced mood disorder who presented to our ER on 5/22 with suicidal ideations and an alcohol level 362. The patient reported that she drank a significant amount alcohol, and "I don't live anymore", "all I care about his my cat." Today the patient tells me that she is no longer suicidal and does not feel that she is depressed. She feels he was "the alcohol talking". Patient stated that she will never hurt herself because of her children. Patient denies any prior history of suicidal attempts or self injury. his patient is well-known to me as she has been hospitalized in our facility a multitude of times in the past for complications related to alcohol use. Looks a she is being in the emergency department every month with high alcohol levels. Prior to this presentation on May 22 she had been seen in the emergency department on May 19. 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 She has been unemployed now for more than  a year. She has been living in a house that she's been renting for many years and the landlord has allow her to stay there without having to pay rent for the last year. She was recently asked to move out of the house as the house is all and will be demolished.   Estimated length of stay: Pt will likely d/c tomorrow.   New goal(s): NA  Review of initial/current patient goals per problem list:   1.  Goal(s): Patient will participate in aftercare plan * Met:  * Target date: at discharge * As evidenced by: Patient will participate within aftercare plan AEB aftercare provider and housing plan at discharge being identified.   2.  Goal (s): Patient will exhibit decreased depressive symptoms and suicidal ideations. * Met:  *  Target date: at discharge * As evidenced by: Patient will utilize self rating of depression at 3 or below and demonstrate decreased signs of depression or be deemed stable for discharge by MD.   3.  Goal(s): Patient will demonstrate decreased signs and symptoms of anxiety. * Met:  * Target date: at discharge * As evidenced by: Patient will utilize self rating of anxiety at 3 or below and demonstrated decreased signs of anxiety, or be deemed stable for discharge by MD   4.  Goal(s): Patient will demonstrate decreased signs of withdrawal due to substance abuse * Met:  * Target date: at discharge * As evidenced by: Patient will produce a CIWA/COWS score of 0, have stable vitals signs, and no symptoms of withdrawal.  Attendees: Patient:  Patty Cruz 5/25/20172:34 PM  Family:   5/25/20172:34 PM  Physician:  Dr. Jerilee Hoh   5/25/20172:34 PM  Nursing:   Floyde Parkins, RN  5/25/20172:34 PM  Case Manager:   5/25/20172:34 PM  Counselor:   5/25/20172:34 PM  Other:  Wray Kearns, LCSWA 5/25/20172:34 PM  Other:  Everitt Amber, LRT  5/25/20172:34 PM  Other:   5/25/20172:34 PM  Other:  5/25/20172:34 PM  Other:  5/25/20172:34 PM  Other:  5/25/20172:34 PM  Other:  5/25/20172:34  PM  Other:  5/25/20172:34 PM  Other:  5/25/20172:34 PM  Other:   5/25/20172:34 PM   Scribe for Treatment Team:   Wray Kearns, MSW, [place]  04/04/2016, 2:34 PM

## 2016-04-05 MED ORDER — TRAZODONE HCL 50 MG PO TABS
ORAL_TABLET | ORAL | Status: AC
  Filled 2016-04-05: qty 2

## 2016-04-05 NOTE — Discharge Instructions (Signed)
[facility] peer support specialist [phone]

## 2016-04-05 NOTE — Plan of Care (Signed)
Problem: Activity: Goal: Imbalance in normal sleep/wake cycle will improve Outcome: Progressing Pt reports getting a better sleep and having more energy today.  Problem: Coping: Goal: Ability to cope will improve Outcome: Progressing Pt discusses with writer coping techniques such as walking.

## 2016-04-05 NOTE — [facility] Group Notes (Signed)
[facility] Group Notes:  (Nursing/MHT/Case Management/Adjunct)  Date:  04/05/2016  Time:  1:31 AM  Type of Therapy:  Psychoeducational Skills  Participation Level:  Active  Participation Quality:  Appropriate and Sharing  Affect:  Appropriate  Cognitive:  Appropriate  Insight:  Good  Engagement in Group:  Engaged  Modes of Intervention:  Discussion, Problem-solving and Socialization  Summary of Progress/Problems:  Chancy MilroyLaquanda Y Darianne Muralles 04/05/2016, 1:31 AM3

## 2016-04-05 NOTE — Progress Notes (Signed)
D: Pt is seen in the milieu this evening. Affect is brighter. She reports more energy and is seen walking the halls. Behavior is appropriate. Denies SI/HI/AVH at this time. Denies pain. A: Emotional support and encouragement provided. Medications administered with education. q15 minute safety checks maintained. R: Pt remains free from harm. Will continue to monitor.

## 2016-04-05 NOTE — [facility] Suicide Risk Assessment (Signed)
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 Alcohol use disorder, severe, dependence (HCC) [F10.20] 04/02/2016  . Alcohol withdrawal (HCC) [F10.239] 04/02/2016  . 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 Suicidal ideation [R45.851] 04/01/2016     Psychiatric Specialty Exam: ROS  Blood pressure 108/52, pulse 101, temperature 97.8 F (36.6 C), temperature source Oral, resp. rate 18, height 5\' 5"  (1.651 m), weight 67.994 kg (149 lb 14.4 oz), SpO2 99 %.Body mass index is 24.94 kg/(m^2).                                                       Mental Status Per Nursing Assessment::   On Admission:  NA  Demographic Factors:  Caucasian, Low socioeconomic status, Living alone and Unemployed  Loss Factors: Financial problems/change in socioeconomic status  Historical Factors: NA  Risk Reduction Factors:   Sense of responsibility to family  Continued Clinical Symptoms:  Alcohol/Substance Abuse/Dependencies Previous Psychiatric Diagnoses and Treatments  Cognitive Features That Contribute To Risk:  None    Suicide Risk:  Minimal: No identifiable suicidal ideation.  Patients presenting with no risk factors but with morbid ruminations; may be classified as minimal risk based on the severity of the depressive symptoms   Patty Cruz,  Patty Printz, MD 04/05/2016, 8:51 AM

## 2016-04-05 NOTE — [facility] Group Notes (Addendum)
[facility] LCSW Group Therapy   04/04/2016 9:30am  Type of Therapy: Group Therapy   Participation Level: Active   Participation Quality: Attentive, Sharing and Supportive   Affect: Appropriate   Cognitive: Alert and Oriented   Insight: Developing/Improving and Engaged   Engagement in Therapy: Developing/Improving and Engaged   Modes of Intervention: Clarification, Confrontation, Discussion, Education, Exploration, Limit-setting, Orientation, Problem-solving, Rapport Building, Dance movement psychotherapisteality Testing, Socialization and Support   Summary of Progress/Problems: The topic for group was balance in life. Today's group focused on defining balance in one's own words, identifying things that can knock one off balance, and exploring healthy ways to maintain balance in life. Group members were asked to provide an example of a time when they felt off balance, describe how they handled that situation, and process healthier ways to regain balance in the future. Group members were asked to share the most important tool for maintaining balance that [facility] and how they plan to apply this method after discharge.    Dorothe PeaJonathan F. Cassadi Purdie, LCSWA, LCAS    04/04/16

## 2016-04-05 NOTE — Progress Notes (Signed)
D/C instructions/transition record/suicide risk assessment/meds/follow-up appointments reviewed and given to patient, pt verbalized understanding, pt's belongings returned to pt, denies SI/HI/AVH, 7 day supply given to patient.

## 2016-04-05 NOTE — [facility] Suicide Risk Assessment (Signed)
[facility] INPATIENT:  Family/Significant Other Suicide Prevention Education  Suicide Prevention Education:  Education Completed;Richard Jenean LindauRhodes (brother) [phone] has been identified by the patient as the family member/significant other with whom the patient will be residing, and identified as the person(s) who will aid the patient in the event of a mental health crisis (suicidal ideations/suicide attempt).  With written consent from the patient, the family member/significant other has been provided the following suicide prevention education, prior to the and/or following the discharge of the patient.  The suicide prevention education provided includes the following:  Suicide risk factors  Suicide prevention and interventions  National Suicide Hotline telephone number  [facility] assessment telephone number  [facility] Emergency Assistance 911  [facility] and/or Residential Mobile Crisis Unit telephone number  Request made of family/significant other to:  Remove weapons (e.g., guns, rifles, knives), all items previously/currently identified as safety concern.    Remove drugs/medications (over-the-counter, prescriptions, illicit drugs), all items previously/currently identified as a safety concern.  The family member/significant other verbalizes understanding of the suicide prevention education information provided.  The family member/significant other agrees to remove the items of safety concern listed above.  Sempra EnergyCandace L Danine Hor MSW, LCSWA  04/05/2016, 9:34 AM

## 2016-04-05 NOTE — Progress Notes (Signed)
  [facility] :  Will you be returning to the same living situation after discharge:  Yes,  home  At discharge, do you have transportation home?: Yes,  brother  Do you have the ability to pay for your medications: Yes,  [facility]   Release of information consent forms completed and in the chart;  Patient's signature needed at discharge.  Patient to Follow up at: Follow-up Information    [facility].   Why:  Your hospital follow up appointment will be walk in. Walk in hours are Monday- Friday between 8:00am and 10:00am.    Contact information:   [address] [facility] [place] [phone]       Next level of care provider has access to [facility] Link:no  Safety Planning and Suicide Prevention discussed: Yes,  with patient and brother   Have you used any form of tobacco in the last 30 days? (Cigarettes, Smokeless Tobacco, Cigars, and/or Pipes): No  Has patient been referred to the Quitline?: N/A patient is not a smoker  Patient has been referred for addiction treatment: Pt. refused referral  Rondall AllegraCandace L Glyn Zendejas MSW, LCSWA  04/05/2016, 2:54 PM

## 2016-04-05 NOTE — [facility] Group Notes (Signed)
[facility] Group Notes:  (Nursing/MHT/Case Management/Adjunct)  Date:  04/05/2016  Time:  4:23 PM  Type of Therapy:  Psychoeducational Skills  Participation Level:  Did Not Attend    Patty Cruz 04/05/2016, 4:23 PM

## 2016-04-05 NOTE — Discharge Summary (Addendum)
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 Alcohol use disorder, severe, dependence (HCC) [F10.20] 04/02/2016  . Alcohol withdrawal (HCC) [F10.239] 04/02/2016  . 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 Suicidal ideation [R45.851] 04/01/2016   History of Present Illness: Patty Cruz is a 60 y.o. female with a history of alcohol dependence, alcohol-induced mood disorder who presented to our ER on 5/22 with suicidal ideations and an alcohol level 362. The patient reported that she drank a significant amount alcohol, and "I don't live anymore", "all I care about his my cat."  Today the patient tells me that she is no longer suicidal and does not feel that she is depressed. She feels he was "the alcohol talking". Patient stated that she will never hurt herself because of her children. Patient denies any prior history of suicidal attempts or self injury.  This patient is well-known to me as she has been hospitalized in our facility a multitude of times in the past for complications related to alcohol use. Looks a she is being in the emergency department every month with high alcohol levels. Prior to this presentation on May 22 she had been seen in the emergency department on May 19. 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 She has been unemployed now for more than a  year. She has been living in a house that she's been renting for many years and the landlord has allow her to stay there without having to pay rent for the last year. She was recently asked to move out of the house as the house is all and will be demolished.   The only income she has at this time is the inheritance that her father left her.  Patient is not receiving any kind of psychiatric or substance abuse treatment. She has been going to AA hasn't a sponsor. Says that she goes to meetings to 4 times a day. She says that AA is like "my family".  Patient has 3 adult children ages 96, 75, 17 years old but they are not close.  Substance abuse history patient has a long history of alcoholism. Her longest period of sobriety was in 2011 after discharge from [facility] when she was sober for 2 years and a half. She said that she also stated sober from March to October of last year. Denies the use of any illicit substances. Denies the use of cigarettes  Associated Signs/Symptoms: Depression Symptoms: denies (Hypo) Manic Symptoms: denies Anxiety Symptoms: denies Psychotic Symptoms: denies PTSD Symptoms: Negative Total Time spent with patient: 1 hour  Past Psychiatric History: Long history of alcoholism. Currently not receiving any psychiatric care. Multiple prior psychiatric hospitalizations due to alcoholism. Has been in rehabilitation for substance abuse a multitude of times. Denies any history of self injury or suicidal attempts in the past. Currently not on any psychiatric medications   Past Medical History:  Denies any history of seizures or head Trauma Past Medical History  Diagnosis Date  . Alcohol abuse   . Medical history non-contributory   . Depression   . Anxiety     Past Surgical History  Procedure Laterality Date  . Cesarean section  1991  . Wrist surgery Right   . No past surgeries     Family History: History reviewed. No pertinent family history.  Family  Psychiatric History: Patient reports that her brother is an alcoholic. Denies any history of suicide in her family. Denies any history of mental illness in her family  Tobacco Screening: non smoker  Social History: Patient has been unemployed for more than a year. 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 From July to December of last year she worked briefly at [employer] but after she relapsed on alcohol human resources demanded her to go to rehabilitation but she declined. No medical insurance insurance at this time History  Alcohol Use  . Yes    Comment: [place] quantity     History  Drug Use No    Social History   Social History  . Marital Status: Divorced    Spouse Name: N/A  . Number of Children: N/A  . Years of Education: N/A   Social History Main Topics  . Smoking status: Never Smoker   . Smokeless tobacco: Never Used  . Alcohol Use: Yes     Comment: [place] quantity  . Drug Use: No  . Sexual Activity: No   Other Topics Concern  . None   Social History Narrative    Hospital Course:    Alcohol induced depressive d/o: currently denies depression. "It was the alcohol talking". Says she is not on any medications and is not f/u with a psychiatrist. Pt says she slept better last night but woke up a few time with diaphoresis.   Insomnia the patient will be discharged on Vistaril when necessary for insomnia  Alcohol withdrawal: Patient will be discharged on Librium 25 mg 3 times a day for 2 days  Alcohol dep: pt will be refer to intensive outpt substance abuse upon discharge. Will prescribe her with antabuse.  Today the patient is denying depression or problems with mood, sleep, appetite or concentration. She denies suicidality, homicidality or having auditory or visual hallucinations. She denies any side effects from her medications. She denies having any physical complaints.  Major stated the  patient participated actively in programming. She did not require seclusion, restraints or forced medications. She did not display any unsafe or destructive behaviors.  Physical Findings: AIMS: Facial and Oral Movements Muscles of Facial Expression: None, normal Lips and Perioral Area: None, normal Jaw: None, normal Tongue: None, normal,Extremity Movements Upper (arms, wrists, hands, fingers): None, normal Lower (legs, knees, ankles, toes): None, normal, Trunk Movements Neck, shoulders, hips: None, normal, Overall Severity Severity of abnormal movements (highest score from questions above): None, normal Incapacitation due to abnormal movements: None, normal Patient's awareness of abnormal movements (rate only patient's report): No Awareness, Dental Status Current problems with teeth and/or dentures?: No Does patient usually wear dentures?: No  CIWA:  CIWA-Ar Total: 5 COWS:     Musculoskeletal: Strength & Muscle Tone: within normal limits Gait & Station: normal Patient leans: N/A  Psychiatric Specialty Exam: Physical Exam  Constitutional: She is oriented to person, place, and time. She appears well-developed and well-nourished.  HENT:  Head: Normocephalic and atraumatic.  Eyes:  EOM are normal.  Neck: Normal range of motion.  Respiratory: Effort normal.  Musculoskeletal: Normal range of motion.  Neurological: She is alert and oriented to person, place, and time.    Review of Systems  Constitutional: Negative.   HENT: Negative.   Eyes: Negative.   Respiratory: Negative.   Cardiovascular: Negative.   Gastrointestinal: Negative.   Genitourinary: Negative.   Musculoskeletal: Negative.   Skin: Negative.   Neurological: Negative.   Endo/Heme/Allergies: Negative.   Psychiatric/Behavioral: Positive for substance abuse. Negative for depression and suicidal ideas.    Blood pressure 108/52, pulse 101, temperature 97.8 F (36.6 C), temperature source Oral, resp. rate 18, height 5'  5" (1.651 m), weight 67.994 kg (149 lb 14.4 oz), SpO2 99 %.Body mass index is 24.94 kg/(m^2).  General Appearance: Well Groomed  Eye Contact:  Good  Speech:  Clear and Coherent  Volume:  Decreased  Mood:  Dysphoric  Affect:  Appropriate  Thought Process:  Linear  Orientation:  Full (Time, Place, and Person)  Thought Content:  Hallucinations: None  Suicidal Thoughts:  No  Homicidal Thoughts:  No  Memory:  Immediate;   Fair Recent;   Fair Remote;   Fair  Judgement:  Poor  Insight:  Shallow  Psychomotor Activity:  Decreased  Concentration:  Concentration: Fair and Attention Span: Fair  Recall:  Fair  Fund of Knowledge:  Good  Language:  Good  Akathisia:  No  Handed:    AIMS (if indicated):     Assets:  Communication Skills Physical Health  ADL's:  Intact  Cognition:  WNL  Sleep:  Number of Hours: 8     Have you used any form of tobacco in the last 30 days? (Cigarettes, Smokeless Tobacco, Cigars, and/or Pipes): No  Has this patient used any form of tobacco in the last 30 days? (Cigarettes, Smokeless Tobacco, Cigars, and/or Pipes) Yes, No  Blood Alcohol level:  Lab Results  Component Value Date   ETH 362* 04/01/2016   ETH 268* 03/29/2016    Metabolic Disorder Labs:  No results found for: HGBA1C, MPG No results found for: PROLACTIN No results found for: CHOL, TRIG, HDL, CHOLHDL, VLDL, LDLCALC  See Psychiatric Specialty Exam and Suicide Risk Assessment completed by Attending Physician prior to discharge.  Discharge destination:  Home  Is patient on multiple antipsychotic therapies at discharge:  No   Has Patient had three or more failed trials of antipsychotic monotherapy by history:  No  Recommended Plan for Multiple Antipsychotic Therapies: NA     Medication List    STOP taking these medications        acamprosate 333 MG tablet  Commonly known as:  CAMPRAL     venlafaxine XR 150 MG 24 hr capsule  Commonly known as:  EFFEXOR-XR      TAKE these  medications      Indication   chlordiazePOXIDE 25 MG capsule  Commonly known as:  LIBRIUM  Take 1 capsule (25 mg total) by mouth 3 (three) times daily.  Notes to Patient:  Take for 2 days (6 tabs given)   Indication:  Acute Alcohol Withdrawal Syndrome     disulfiram 250 MG tablet  Commonly known as:  ANTABUSE  Take 1 tablet (250 mg total) by mouth daily.   Indication:  Excessive Use of Alcohol     hydrOXYzine 50 MG tablet  Commonly known as:  ATARAX/VISTARIL  Take 1 tablet (50 mg total) by mouth at bedtime.  Notes to Patient:  Use as  needed for insomnia   Indication:  insomnia       Follow-up Information    Follow up with [facility]. Go on 04/08/2016.   Why:  Your hospital follow up appointment will be walk in. Walk in hours are Monday- Friday between 8:00am and 10:00am.    Contact information:   [address] [place] [address] [phone]       Signed: Jimmy Footman, MD 04/05/2016, 11:28 AM

## 2016-04-05 NOTE — Progress Notes (Signed)
Recreation Therapy Notes  Date: 05.26.17 Time: 9:30 am Location: Craft Room  Group Topic: Coping Skills  Goal Area(s) Addresses:  Patient will verbalize benefit of using art as a coping skill. Patient will verbalize one emotion experienced during group.  Behavioral Response: Attentive, Interactive  Intervention: Coloring  Activity: Patients were given coloring sheets and instructed to color while thinking about what their mind was focused on and what emotions they felt.  Education: LRT educated patient on healthy coping skills.  Education Outcome: Acknowledges education/In group clarification offered   Clinical Observations/Feedback: Patient colored coloring sheet. Patient contributed to group discussion by stating what emotions she felt and what her mind was focused on.  Jacquelynn CreeGreene,Deziray Nabi M, LRT/CTRS 04/05/2016 12:16 PM

## 2016-04-05 NOTE — Plan of Care (Signed)
Problem: [facility] Participation in Recreation Therapeutic Interventions Goal: STG-Other Recreation Therapy Goal (Specify) STG: Stress Management - Within 4 treatment sessions, patient will verbalize understanding of the stress management techniques in each of 2 treatment sessions to increase stress management skills post d/c.  Outcome: Completed/Met Date Met:  04/05/16 Treatment Session 3; Completed 2 out of 2: At approximately 11:30 am, LRT met with patient in craft room. Patient verbalized understanding of the stress management techniques. LRT encouraged patient to use the techniques post d/c.  Leonette Monarch, LRT/CTRS 05.26.17 12:27 pm

## 2016-04-08 NOTE — Progress Notes (Signed)
Recreation Therapy Notes  INPATIENT RECREATION TR PLAN  Patient Details Name: Patty Cruz MRN: [id number] DOB: [birth year] Today's Date: 04/08/2016  Rec Therapy Plan Is patient appropriate for Therapeutic Recreation?: Yes Treatment times per week: At least once a week TR Treatment/Interventions: 1:1 session, Group participation (Comment) (Appropriate participation in daily recreational therapy tx)  Discharge Criteria Pt will be discharged from therapy if:: Treatment goals are met, Discharged Treatment plan/goals/alternatives discussed and agreed upon by:: Patient/family  Discharge Summary Short term goals set: See Care Plan Short term goals met: Complete Progress toward goals comments: One-to-one attended Which groups?: Self-esteem, Coping skills, Leisure education, Goal setting One-to-one attended: Stress management, coping skills Reason goals not met: N/A Therapeutic equipment acquired: None Reason patient discharged from therapy: Discharge from hospital Pt/family agrees with progress & goals achieved: Yes Date patient discharged from therapy: 04/05/16   Leonette Monarch, LRT/CTRS 04/08/2016, 9:16 AM

## 2018-08-18 ENCOUNTER — Ambulatory Visit: Payer: Self-pay | Admitting: Nurse Practitioner

## 2018-08-20 ENCOUNTER — Ambulatory Visit: Payer: Self-pay | Admitting: Nurse Practitioner

## 2018-08-20 ENCOUNTER — Encounter: Payer: Self-pay | Admitting: Nurse Practitioner

## 2018-08-20 VITALS — BP 109/72 | HR 65 | Temp 98.3°F | Ht 63.0 in | Wt 113.6 lb

## 2018-08-20 DIAGNOSIS — F1011 Alcohol abuse, in remission: Secondary | ICD-10-CM

## 2018-08-20 DIAGNOSIS — R5382 Chronic fatigue, unspecified: Secondary | ICD-10-CM | POA: Insufficient documentation

## 2018-08-20 DIAGNOSIS — H9312 Tinnitus, left ear: Secondary | ICD-10-CM | POA: Insufficient documentation

## 2018-08-20 NOTE — Assessment & Plan Note (Signed)
Chronic, stable.  Has been in remission for 2 1/2 years.  Continue to monitor closely for relapse and encourage continued cessation.  H/O heavy alcohol use with suicidal ideation.

## 2018-08-20 NOTE — Assessment & Plan Note (Signed)
Chronic, ongoing for 6 months in presence of h/o heavy alcohol use and poor sleep pattern.  Obtain CBC, Ferritin, B12, TSH, CMP, iron level today.  Continue Melatonin at this time per home dosing.  Return in 2 weeks for annual physical.  Consider use of Trazodone, which may assist with sleep and mood.

## 2018-08-20 NOTE — Assessment & Plan Note (Addendum)
Chronic, ongoing for 1 1/2 years.  In presence of h/o heavy alcohol use and poor sleep pattern.  Obtain lab work today.  Return in 2 weeks for annual physical.  Continue at home Melatonin.  Consider addition of SSRI if appropriate and pt continues to abstain from alcohol use.  Consider referral to audiology if continued issue (at this time pt is self pay and would prefer not to have referral).

## 2018-08-20 NOTE — Patient Instructions (Addendum)
Fatigue Fatigue is feeling tired all of the time, a lack of energy, or a lack of motivation. Occasional or mild fatigue is often a normal response to activity or life in general. However, long-lasting (chronic) or extreme fatigue may indicate an underlying medical condition. Tinnitus Tinnitus refers to hearing a sound when there is no actual source for that sound. This is often described as ringing in the ears. However, people with this condition may hear a variety of noises. A person may hear the sound in one ear or in both ears. The sounds of tinnitus can be soft, loud, or somewhere in between. Tinnitus can last for a few seconds or can be constant for days. It may go away without treatment and come back at various times. When tinnitus is constant or happens often, it can lead to other problems, such as trouble sleeping and trouble concentrating. Almost everyone experiences tinnitus at some point. Tinnitus that is long-lasting (chronic) or comes back often is a problem that may require medical attention. What are the causes? The cause of tinnitus is often not known. In some cases, it can result from other problems or conditions, including:  Exposure to loud noises from machinery, music, or other sources.  Hearing loss.  Ear or sinus infections.  Earwax buildup.  A foreign object in the ear.  Use of certain medicines.  Use of alcohol and caffeine.  High blood pressure.  Heart diseases.  Anemia.  Allergies.  Meniere disease.  Thyroid problems.  Tumors.  An enlarged part of a weakened blood vessel (aneurysm).  What are the signs or symptoms? The main symptom of tinnitus is hearing a sound when there is no source for that sound. It may sound like:  Buzzing.  Roaring.  Ringing.  Blowing air, similar to the sound heard when you listen to a seashell.  Hissing.  Whistling.  Sizzling.  Humming.  Running water.  A sustained musical note.  How is this  diagnosed? Tinnitus is diagnosed based on your symptoms. Your health care provider will do a physical exam. A comprehensive hearing exam (audiologic exam) will be done if your tinnitus:  Affects only one ear (unilateral).  Causes hearing difficulties.  Lasts 6 months or longer.  You may also need to see a health care provider who specializes in hearing disorders (audiologist). You may be asked to complete a questionnaire to determine the severity of your tinnitus. Tests may be done to help determine the cause and to rule out other conditions. These can include:  Imaging studies of your head and brain, such as: ? A CT scan. ? An MRI.  An imaging study of your blood vessels (angiogram).  How is this treated? Treating an underlying medical condition can sometimes make tinnitus go away. If your tinnitus continues, other treatments may include:  Medicines, such as certain antidepressants or sleeping aids.  Sound generators to mask the tinnitus. These include: ? Tabletop sound machines that play relaxing sounds to help you fall asleep. ? Wearable devices that fit in your ear and play sounds or music. ? A small device that uses headphones to deliver a signal embedded in music (acoustic neural stimulation). In time, this may change the pathways of your brain and make you less sensitive to tinnitus. This device is used for very severe cases when no other treatment is working.  Therapy and counseling to help you manage the stress of living with tinnitus.  Using hearing aids or cochlear implants, if your tinnitus is related  to hearing loss.  Follow these instructions at home:  When possible, avoid being in loud places and being exposed to loud sounds.  Wear hearing protection, such as earplugs, when you are exposed to loud noises.  Do not take stimulants, such as nicotine, alcohol, or caffeine.  Practice techniques for reducing stress, such as meditation, yoga, or deep breathing.  Use a  white noise machine, a humidifier, or other devices to mask the sound of tinnitus.  Sleep with your head slightly raised. This may reduce the impact of tinnitus.  Try to get plenty of rest each night. Contact a health care provider if:  You have tinnitus in just one ear.  Your tinnitus continues for 3 weeks or longer without stopping.  Home care measures are not helping.  You have tinnitus after a head injury.  You have tinnitus along with any of the following: ? Dizziness. ? Loss of balance. ? Nausea and vomiting. This information is not intended to replace advice given to you by your health care provider. Make sure you discuss any questions you have with your health care provider. Document Released: 10/28/2005 Document Revised: 06/30/2016 Document Reviewed: 03/30/2014 Elsevier Interactive Patient Education  2018 [employer]. Follow these instructions at home: Watch your fatigue for any changes. The following actions may help to lessen any discomfort you are feeling:  Talk to your health care provider about how much sleep you need each night. Try to get the required amount every night.  Take medicines only as directed by your health care provider.  Eat a healthy and nutritious diet. Ask your health care provider if you need help changing your diet.  Drink enough fluid to keep your urine clear or pale yellow.  Practice ways of relaxing, such as yoga, meditation, massage therapy, or acupuncture.  Exercise regularly.  Change situations that cause you stress. Try to keep your work and personal routine reasonable.  Do not abuse illegal drugs.  Limit alcohol intake to no more than 1 drink per day for nonpregnant women and 2 drinks per day for men. One drink equals 12 ounces of beer, 5 ounces of wine, or 1 ounces of hard liquor.  Take a multivitamin, if directed by your health care provider.  Contact a health care provider if:  Your fatigue does not get better.  You have  a fever.  You have unintentional weight loss or gain.  You have headaches.  You have difficulty: ? Falling asleep. ? Sleeping throughout the night.  You feel angry, guilty, anxious, or sad.  You are unable to have a bowel movement (constipation).  You skin is dry.  Your legs or another part of your body is swollen. Get help right away if:  You feel confused.  Your vision is blurry.  You feel faint or pass out.  You have a severe headache.  You have severe abdominal, pelvic, or back pain.  You have chest pain, shortness of breath, or an irregular or fast heartbeat.  You are unable to urinate or you urinate less than normal.  You develop abnormal bleeding, such as bleeding from the rectum, vagina, nose, lungs, or nipples.  You vomit blood.  You have thoughts about harming yourself or committing suicide.  You are worried that you might harm someone else. This information is not intended to replace advice given to you by your health care provider. Make sure you discuss any questions you have with your health care provider. Document Released: 08/25/2007 Document Revised: 04/04/2016 Document  Reviewed: 03/01/2014 Elsevier Interactive Patient Education  2018 [employer] Plantar Fasciitis Plantar fasciitis is a painful foot condition that affects the heel. It occurs when the band of tissue that connects the toes to the heel bone (plantar fascia) becomes irritated. This can happen after exercising too much or doing other repetitive activities (overuse injury). The pain from plantar fasciitis can range from mild irritation to severe pain that makes it difficult for you to walk or move. The pain is usually worse in the morning or after you have been sitting or lying down for a while. What are the causes? This condition may be caused by:  Standing for long periods of time.  Wearing shoes that do not fit.  Doing high-impact activities, including running, aerobics, and  ballet.  Being overweight.  Having an abnormal way of walking (gait).  Having tight calf muscles.  Having high arches in your feet.  Starting a new athletic activity.  What are the signs or symptoms? The main symptom of this condition is heel pain. Other symptoms include:  Pain that gets worse after activity or exercise.  Pain that is worse in the morning or after resting.  Pain that goes away after you walk for a few minutes.  How is this diagnosed? This condition may be diagnosed based on your signs and symptoms. Your health care provider will also do a physical exam to check for:  A tender area on the bottom of your foot.  A high arch in your foot.  Pain when you move your foot.  Difficulty moving your foot.  You may also need to have imaging studies to confirm the diagnosis. These can include:  X-rays.  Ultrasound.  MRI.  How is this treated? Treatment for plantar fasciitis depends on the severity of the condition. Your treatment may include:  Rest, ice, and over-the-counter pain medicines to manage your pain.  Exercises to stretch your calves and your plantar fascia.  A splint that holds your foot in a stretched, upward position while you sleep (night splint).  Physical therapy to relieve symptoms and prevent problems in the future.  Cortisone injections to relieve severe pain.  Extracorporeal shock wave therapy (ESWT) to stimulate damaged plantar fascia with electrical impulses. It is often used as a last resort before surgery.  Surgery, if other treatments have not worked after 12 months.  Follow these instructions at home:  Take medicines only as directed by your health care provider.  Avoid activities that cause pain.  Roll the bottom of your foot over a bag of ice or a bottle of cold water. Do this for 20 minutes, 3-4 times a day.  Perform simple stretches as directed by your health care provider.  Try wearing athletic shoes with air-sole  or gel-sole cushions or soft shoe inserts.  Wear a night splint while sleeping, if directed by your health care provider.  Keep all follow-up appointments with your health care provider. How is this prevented?  Do not perform exercises or activities that cause heel pain.  Consider finding low-impact activities if you continue to have problems.  Lose weight if you need to. The best way to prevent plantar fasciitis is to avoid the activities that aggravate your plantar fascia. Contact a health care provider if:  Your symptoms do not go away after treatment with home care measures.  Your pain gets worse.  Your pain affects your ability to move or do your daily activities. This information is not intended to replace advice  given to you by your health care provider. Make sure you discuss any questions you have with your health care provider. Document Released: 07/23/2001 Document Revised: 04/01/2016 Document Reviewed: 09/07/2014 Elsevier Interactive Patient Education  [employer].

## 2018-08-20 NOTE — Progress Notes (Signed)
BP 109/72   Pulse 65   Temp 98.3 F (36.8 C) (Oral)   Ht '5\' 3"'$  (1.6 m)   Wt 113 lb 9.6 oz (51.5 kg)   SpO2 99%   BMI 20.12 kg/m    Subjective:    Patient ID: Patty Cruz, female    DOB: 1956/01/09, 62 y.o.   MRN: [id number]  HPI: Patty Cruz is a 62 y.o. female presents for new patient visit.  Reviewed new patient information with patient and discussed nurse practitioner role.  She was previously a patient at practice, but has not been seen in "several years".  Chief Complaint  Patient presents with  . Establish Care  . Fatigue    pt states she has been feeling very fatigued, tired, and exhausted for the past 6 months  . Tinnitus    pt states she has been experiencing ringing in her ears for the past year   Fatigue: Chronic, present for six months.  Gets about 5 hours sleep a night, but wakes up frequently.  She reports she falls asleep easily, but does not stay asleep.  Has tried Melatonin '9MG'$ , which she received in treatment years ao. States it does not work.  She is cautious about use of other medications d/t h/o alcohol abuse and would prefer to avoid anything that may trigger addiction.  Last went to treatment in January 2016.  Has not drank alcohol in 2 1/2 years.  Works at [employer] and reports she is frequently around alcohol but does "not crave it".  States she eats a bowl of cereal every night now vs what she used to do which was "drink alcohol before bed".  Has cold intolerance, hands "stay cold a lot".  Denies constipation, decrease in activity tolerance (able to work without issue), SOB, CP, weakness, syncope, weight gain or loss, blood in stool, or abdominal pain.  Tinnitus: Chronic, ongoing.  Has been present for 1 1/2 years.  Mostly to the left ear and notices it more when it is quiet or she is really tired.  States that it is intermittent, not constant.  Denies otalgia, dizziness, drainage, headache, noise intolerance, hearing loss, or neck/jaw pain.   H/O heavy alcohol use and she reports concern that this has something to do with the tinnitus.  Has not taken any medications for this issue.  She can think of nothing that improves episodes when present.  Relevant past medical, surgical, family and social history reviewed and updated as indicated. Interim medical history since our last visit reviewed. Allergies and medications reviewed and updated.  Review of Systems  Constitutional: Positive for fatigue. Negative for activity change, appetite change, diaphoresis, fever and unexpected weight change.  HENT: Positive for tinnitus. Negative for congestion, ear discharge, ear pain, hearing loss, postnasal drip, sinus pressure, sinus pain and sore throat.   Eyes: Negative for visual disturbance.  Respiratory: Negative for cough, chest tightness, shortness of breath and wheezing.   Cardiovascular: Negative for chest pain and palpitations.  Gastrointestinal: Negative for abdominal distention, abdominal pain, blood in stool, constipation, diarrhea, nausea and vomiting.  Endocrine: Positive for cold intolerance. Negative for heat intolerance, polydipsia, polyphagia and polyuria.  Genitourinary: Negative.   Musculoskeletal: Negative.   Skin: Negative.   Allergic/Immunologic: Negative.   Neurological: Negative for dizziness, tremors, weakness, light-headedness, numbness and headaches.  Hematological: Negative.   Psychiatric/Behavioral: Positive for sleep disturbance. Negative for behavioral problems. The patient is nervous/anxious.      Per HPI unless specifically  indicated above     Objective:    BP 109/72   Pulse 65   Temp 98.3 F (36.8 C) (Oral)   Ht '5\' 3"'$  (1.6 m)   Wt 113 lb 9.6 oz (51.5 kg)   SpO2 99%   BMI 20.12 kg/m   Wt Readings from Last 3 Encounters:  08/20/18 113 lb 9.6 oz (51.5 kg)  04/01/16 110 lb (49.9 kg)  03/29/16 110 lb (49.9 kg)    Physical Exam  Constitutional: She is oriented to person, place, and time. She appears  well-developed and well-nourished.  HENT:  Head: Normocephalic and atraumatic.  Right Ear: External ear normal. No decreased hearing is noted.  Left Ear: External ear normal. No decreased hearing is noted.  Rinne Test AC > BC Weber Testing normal  Eyes: Pupils are equal, round, and reactive to light. EOM are normal.  Neck: Normal range of motion. Neck supple. No JVD present. No thyromegaly present.  Cardiovascular: Normal rate, regular rhythm and normal heart sounds.  Pulmonary/Chest: Effort normal and breath sounds normal.  Abdominal: Soft. Bowel sounds are normal.  Neurological: She is alert and oriented to person, place, and time. She has normal reflexes. She displays a negative Romberg sign.  Skin: Skin is warm and dry.  Psychiatric: She has a normal mood and affect. Her behavior is normal. Judgment and thought content normal.      Office Visit from 08/20/2018 in [facility]  PHQ-9 Total Score  8     GAD 7 : Generalized Anxiety Score 08/20/2018  Nervous, Anxious, on Edge 0  Control/stop worrying 0  Worry too much - different things 0  Trouble relaxing 1  Restless 1  Easily annoyed or irritable 0  Afraid - awful might happen 0  Total GAD 7 Score 2    Results for orders placed or performed during the hospital encounter of 04/01/16  Comprehensive metabolic panel  Result Value Ref Range   Sodium 137 135 - 145 mmol/L   Potassium 4.0 3.5 - 5.1 mmol/L   Chloride 102 101 - 111 mmol/L   CO2 24 22 - 32 mmol/L   Glucose, Bld 86 65 - 99 mg/dL   BUN 10 6 - 20 mg/dL   Creatinine, Ser 0.53 0.44 - 1.00 mg/dL   Calcium 8.6 (L) 8.9 - 10.3 mg/dL   Total Protein 7.5 6.5 - 8.1 g/dL   Albumin 4.4 3.5 - 5.0 g/dL   AST 26 15 - 41 U/L   ALT 19 14 - 54 U/L   Alkaline Phosphatase 93 38 - 126 U/L   Total Bilirubin 0.5 0.3 - 1.2 mg/dL   GFR calc non Af Amer >60 >60 mL/min   GFR calc Af Amer >60 >60 mL/min   Anion gap 11 5 - 15  Ethanol  Result Value Ref Range   Alcohol, Ethyl  (B) 362 (HH) <5 mg/dL  Salicylate level  Result Value Ref Range   Salicylate Lvl <2.2 2.8 - 30.0 mg/dL  Acetaminophen level  Result Value Ref Range   Acetaminophen (Tylenol), Serum <10 (L) 10 - 30 ug/mL  cbc  Result Value Ref Range   WBC 8.1 3.6 - 11.0 K/uL   RBC 4.55 3.80 - 5.20 MIL/uL   Hemoglobin 14.6 12.0 - 16.0 g/dL   HCT 43.1 35.0 - 47.0 %   MCV 94.7 80.0 - 100.0 fL   MCH 32.1 26.0 - 34.0 pg   MCHC 33.9 32.0 - 36.0 g/dL   RDW 13.1 11.5 -  14.5 %   Platelets 328 150 - 440 K/uL  Urine Drug Screen, Qualitative  Result Value Ref Range   Tricyclic, Ur Screen NONE DETECTED NONE DETECTED   Amphetamines, Ur Screen NONE DETECTED NONE DETECTED   MDMA (Ecstasy)Ur Screen NONE DETECTED NONE DETECTED   Cocaine Metabolite,Ur [place] NONE DETECTED NONE DETECTED   Opiate, Ur Screen NONE DETECTED NONE DETECTED   Phencyclidine (PCP) Ur S NONE DETECTED NONE DETECTED   Cannabinoid 50 Ng, Ur [place] NONE DETECTED NONE DETECTED   Barbiturates, Ur Screen NONE DETECTED NONE DETECTED   Benzodiazepine, Ur Scrn NONE DETECTED NONE DETECTED   Methadone Scn, Ur NONE DETECTED NONE DETECTED      Assessment & Plan:   Problem List Items Addressed This Visit      Other   Chronic fatigue - Primary    Chronic, ongoing for 6 months in presence of h/o heavy alcohol use and poor sleep pattern.  Obtain CBC, Ferritin, B12, TSH, CMP, iron level today.  Continue Melatonin at this time per home dosing.  Return in 2 weeks for annual physical.  Consider use of Trazodone, which may assist with sleep and mood.      Relevant Orders   CBC   B12   TSH   Comp Met (CMET)   Ferritin   Iron   Tinnitus aurium, left    Chronic, ongoing for 1 1/2 years.  In presence of h/o heavy alcohol use and poor sleep pattern.  Obtain lab work today.  Return in 2 weeks for annual physical.  Continue at home Melatonin.  Consider addition of SSRI if appropriate and pt continues to abstain from alcohol use.  Consider referral to audiology if  continued issue (at this time pt is self pay and would prefer not to have referral).      Alcohol abuse, in remission    Chronic, stable.  Has been in remission for 2 1/2 years.  Continue to monitor closely for relapse and encourage continued cessation.  H/O heavy alcohol use with suicidal ideation.          Follow up plan: Return in about 2 weeks (around 09/03/2018), or if symptoms worsen or fail to improve, for for annual physical assessment.

## 2018-08-21 LAB — COMPREHENSIVE METABOLIC PANEL
ALT: 31 IU/L (ref 0–32)
AST: 41 IU/L — ABNORMAL HIGH (ref 0–40)
Albumin/Globulin Ratio: 1.8 (ref 1.2–2.2)
Albumin: 4.9 g/dL — ABNORMAL HIGH (ref 3.6–4.8)
Alkaline Phosphatase: 142 IU/L — ABNORMAL HIGH (ref 39–117)
BUN/Creatinine Ratio: 22 (ref 12–28)
BUN: 14 mg/dL (ref 8–27)
Bilirubin Total: 0.3 mg/dL (ref 0.0–1.2)
CO2: 24 mmol/L (ref 20–29)
CREATININE: 0.65 mg/dL (ref 0.57–1.00)
Calcium: 9.6 mg/dL (ref 8.7–10.3)
Chloride: 93 mmol/L — ABNORMAL LOW (ref 96–106)
GFR, EST AFRICAN AMERICAN: 110 mL/min/{1.73_m2} (ref >59)
GFR, EST NON AFRICAN AMERICAN: 95 mL/min/{1.73_m2} (ref >59)
GLUCOSE: 89 mg/dL (ref 65–99)
Globulin, Total: 2.8 g/dL (ref 1.5–4.5)
Potassium: 5.3 mmol/L — ABNORMAL HIGH (ref 3.5–5.2)
SODIUM: 131 mmol/L — AB (ref 134–144)
Total Protein: 7.7 g/dL (ref 6.0–8.5)

## 2018-08-21 LAB — VITAMIN B12: Vitamin B-12: 971 pg/mL (ref 232–1245)

## 2018-08-21 LAB — TSH: TSH: 4.31 u[IU]/mL (ref 0.450–4.500)

## 2018-08-21 LAB — CBC
Hematocrit: 38.4 % (ref 34.0–46.6)
Hemoglobin: 12.9 g/dL (ref 11.1–15.9)
MCH: 30.8 pg (ref 26.6–33.0)
MCHC: 33.6 g/dL (ref 31.5–35.7)
MCV: 92 fL (ref 79–97)
PLATELETS: 397 10*3/uL (ref 150–450)
RBC: 4.19 x10E6/uL (ref 3.77–5.28)
RDW: 11.8 % — AB (ref 12.3–15.4)
WBC: 7.2 10*3/uL (ref 3.4–10.8)

## 2018-08-21 LAB — FERRITIN: Ferritin: 111 ng/mL (ref 15–150)

## 2018-08-21 LAB — IRON: Iron: 78 ug/dL (ref 27–139)

## 2018-08-25 ENCOUNTER — Encounter: Payer: Self-pay | Admitting: Family Medicine

## 2018-09-03 ENCOUNTER — Encounter: Payer: Self-pay | Admitting: Nurse Practitioner

## 2019-03-31 ENCOUNTER — Ambulatory Visit: Payer: Self-pay | Admitting: Nurse Practitioner

## 2019-03-31 ENCOUNTER — Encounter: Payer: Self-pay | Admitting: Nurse Practitioner

## 2019-03-31 ENCOUNTER — Other Ambulatory Visit: Payer: Self-pay

## 2019-03-31 VITALS — BP 101/67 | HR 67 | Temp 98.7°F | Ht 64.0 in | Wt 119.0 lb

## 2019-03-31 DIAGNOSIS — F320 Major depressive disorder, single episode, mild: Secondary | ICD-10-CM

## 2019-03-31 DIAGNOSIS — Z1239 Encounter for other screening for malignant neoplasm of breast: Secondary | ICD-10-CM

## 2019-03-31 DIAGNOSIS — N644 Mastodynia: Secondary | ICD-10-CM

## 2019-03-31 MED ORDER — VENLAFAXINE HCL ER 37.5 MG PO CP24: 37.5000 mg | ORAL_CAPSULE | Freq: Every day | ORAL | 2 refills | Status: DC

## 2019-03-31 NOTE — Assessment & Plan Note (Signed)
Acute x one month, no masses or changes noted on exam.  Mammogram screening ordered.

## 2019-03-31 NOTE — Progress Notes (Signed)
BP 101/67   Pulse 67   Temp 98.7 F (37.1 C) (Oral)   Ht '5\' 4"'$  (1.626 m)   Wt 119 lb (54 kg)   SpO2 94%   BMI 20.43 kg/m    Subjective:    Patient ID: Patty Cruz, female    DOB: 1956/07/02, 63 y.o.   MRN: [id number]  HPI: Patty Cruz is a 63 y.o. female  Chief Complaint  Patient presents with  . Depression  . Breast discomfort    left side x about a month ago   DEPRESSION In past was on Effexor.  Reports feeling "a lot of regrets" due to her history of alcohol abuse. Has been talking to pastor.  States during the Covid 19 has had "too much time in my head".  States she is much more a spring and summer person, weather has effected mood.  She is very aware of her triggers, knows that if she takes one drink that she will not turn back.  States she has not been drinking, last drink was 3 years ago.  Did have a low sodium level on labs in October 2019, 131, she could not return for recheck due to self pay and out of pocket cost.  She does not want sodium level rechecked today due to cost, but reports no headache, N&V, or muscle cramps.  States at time she was drinking "a lot" of water, but has not switched to Gatorade or other electrolyte drinks.  Will avoid SSRI at this time, discussed with patient.  She wishes to trial low dose of SNRI, Effexor, which worked in past. Mood status: exacerbated Satisfied with current treatment?: no Symptom severity: moderate  Duration of current treatment : months Psychotherapy/counseling: none Previous psychiatric medications: Effexor Depressed mood: yes Anxious mood: yes Anhedonia: no Significant weight loss or gain: no Insomnia: yes hard to stay asleep Fatigue: yes Feelings of worthlessness or guilt: yes Impaired concentration/indecisiveness: yes Suicidal ideations: no Hopelessness: no Crying spells: yes Depression screen [facility] 2/9 03/31/2019 08/20/2018  Decreased Interest 2 1  Down, Depressed, Hopeless 2 1  PHQ - 2 Score 4 2  Altered  sleeping 3 3  Tired, decreased energy 0 3  Change in appetite 1 0  Feeling bad or failure about yourself  0 0  Trouble concentrating 0 0  Moving slowly or fidgety/restless 0 0  Suicidal thoughts 0 0  PHQ-9 Score 8 8  Difficult doing work/chores Somewhat difficult Not difficult at all   GAD 7 : Generalized Anxiety Score 03/31/2019 08/20/2018  Nervous, Anxious, on Edge 2 0  Control/stop worrying 2 0  Worry too much - different things 2 0  Trouble relaxing 0 1  Restless 0 1  Easily annoyed or irritable 0 0  Afraid - awful might happen 1 0  Total GAD 7 Score 7 2  Anxiety Difficulty Somewhat difficult -   LEFT BREAST PAIN Started one month ago, she can not pinpoint location of pain.  States somewhere "left breast left side".  Denies any recent illness or trauma.  Denies any changes in breast. Duration :months Location: left Onset: gradual Severity: mild Quality: dull and aching Frequency: intermittent Redness: no Swelling: no Trauma: no trauma Breastfeeding: no Associated with menstral cycle: no Nipple discharge: no Breast lump: no Status: stable Treatments attempted: none Previous mammogram: yes, several years ago  Relevant past medical, surgical, family and social history reviewed and updated as indicated. Interim medical history since our last visit reviewed. Allergies and medications  reviewed and updated.  Review of Systems  Constitutional: Negative for activity change, appetite change, diaphoresis, fatigue and fever.  Respiratory: Negative for cough, chest tightness and shortness of breath.   Cardiovascular: Negative for chest pain, palpitations and leg swelling.  Gastrointestinal: Negative for abdominal distention, abdominal pain, constipation, diarrhea, nausea and vomiting.  Neurological: Negative for dizziness, syncope, weakness, light-headedness, numbness and headaches.  Psychiatric/Behavioral: Positive for decreased concentration and sleep disturbance. Negative  for self-injury and suicidal ideas. The patient is nervous/anxious.     Per HPI unless specifically indicated above     Objective:    BP 101/67   Pulse 67   Temp 98.7 F (37.1 C) (Oral)   Ht '5\' 4"'$  (1.626 m)   Wt 119 lb (54 kg)   SpO2 94%   BMI 20.43 kg/m   Wt Readings from Last 3 Encounters:  03/31/19 119 lb (54 kg)  08/20/18 113 lb 9.6 oz (51.5 kg)  04/01/16 110 lb (49.9 kg)    Physical Exam Vitals signs and nursing note reviewed.  Constitutional:      General: She is awake. She is not in acute distress.    Appearance: She is well-developed. She is not ill-appearing.  HENT:     Head: Normocephalic.     Right Ear: Hearing normal.     Left Ear: Hearing normal.     Nose: Nose normal.  Eyes:     General: Lids are normal.        Right eye: No discharge.        Left eye: No discharge.     Conjunctiva/sclera: Conjunctivae normal.     Pupils: Pupils are equal, round, and reactive to light.  Neck:     Musculoskeletal: Normal range of motion and neck supple.     Thyroid: No thyromegaly.     Vascular: No carotid bruit or JVD.  Cardiovascular:     Rate and Rhythm: Normal rate and regular rhythm.     Heart sounds: Normal heart sounds. No murmur. No gallop.   Pulmonary:     Effort: Pulmonary effort is normal. No accessory muscle usage or respiratory distress.     Breath sounds: Normal breath sounds.  Chest:     Breasts:        Right: Normal. No swelling, bleeding, inverted nipple, mass, nipple discharge, skin change or tenderness.        Left: Tenderness present. No swelling, bleeding, inverted nipple, mass, nipple discharge or skin change.    Abdominal:     General: Bowel sounds are normal.     Palpations: Abdomen is soft.  Musculoskeletal:     Right lower leg: No edema.     Left lower leg: No edema.  Lymphadenopathy:     Cervical: No cervical adenopathy.     Upper Body:     Right upper body: No supraclavicular, axillary or pectoral adenopathy.     Left upper body:  No supraclavicular, axillary or pectoral adenopathy.  Skin:    General: Skin is warm and dry.  Neurological:     Mental Status: She is alert and oriented to person, place, and time.  Psychiatric:        Attention and Perception: Attention normal.        Mood and Affect: Mood normal.        Behavior: Behavior normal. Behavior is cooperative.        Thought Content: Thought content normal.        Judgment: Judgment normal.  Results for orders placed or performed in visit on 08/20/18  CBC  Result Value Ref Range   WBC 7.2 3.4 - 10.8 x10E3/uL   RBC 4.19 3.77 - 5.28 x10E6/uL   Hemoglobin 12.9 11.1 - 15.9 g/dL   Hematocrit 38.4 34.0 - 46.6 %   MCV 92 79 - 97 fL   MCH 30.8 26.6 - 33.0 pg   MCHC 33.6 31.5 - 35.7 g/dL   RDW 11.8 (L) 12.3 - 15.4 %   Platelets 397 150 - 450 x10E3/uL  B12  Result Value Ref Range   Vitamin B-12 971 232 - 1,245 pg/mL  TSH  Result Value Ref Range   TSH 4.310 0.450 - 4.500 uIU/mL  Comp Met (CMET)  Result Value Ref Range   Glucose 89 65 - 99 mg/dL   BUN 14 8 - 27 mg/dL   Creatinine, Ser 0.65 0.57 - 1.00 mg/dL   GFR calc non Af Amer 95 >59 mL/min/1.73   GFR calc Af Amer 110 >59 mL/min/1.73   BUN/Creatinine Ratio 22 12 - 28   Sodium 131 (L) 134 - 144 mmol/L   Potassium 5.3 (H) 3.5 - 5.2 mmol/L   Chloride 93 (L) 96 - 106 mmol/L   CO2 24 20 - 29 mmol/L   Calcium 9.6 8.7 - 10.3 mg/dL   Total Protein 7.7 6.0 - 8.5 g/dL   Albumin 4.9 (H) 3.6 - 4.8 g/dL   Globulin, Total 2.8 1.5 - 4.5 g/dL   Albumin/Globulin Ratio 1.8 1.2 - 2.2   Bilirubin Total 0.3 0.0 - 1.2 mg/dL   Alkaline Phosphatase 142 (H) 39 - 117 IU/L   AST 41 (H) 0 - 40 IU/L   ALT 31 0 - 32 IU/L  Ferritin  Result Value Ref Range   Ferritin 111 15 - 150 ng/mL  Iron  Result Value Ref Range   Iron 78 27 - 139 ug/dL      Assessment & Plan:   Problem List Items Addressed This Visit      Other   Depression, major, single episode, mild ([place]) - Primary    New onset with current Covid 19  pandemic.  Will trial low dose 37.5 MG Effexor, script sent.  She denies SI/HI or recent alcohol use.  Last NA+ was 131, she refuses lab draw today due to cost and denies symptoms.  Discussed with her at length.  Return in 4 weeks for follow-up mood.      Relevant Medications   venlafaxine XR (EFFEXOR XR) 37.5 MG 24 hr capsule   Pain of left breast    Acute x one month, no masses or changes noted on exam.  Mammogram screening ordered.       Other Visit Diagnoses    Breast cancer screening       Relevant Orders   MM DIGITAL SCREENING BILATERAL       Follow up plan: Return in about 4 weeks (around 04/28/2019) for Mood follow-up.

## 2019-03-31 NOTE — Patient Instructions (Addendum)
Please call this # to schedule your mammogram: [phone]  Can put Nuun tablets in water or drink Gatorade.   Venlafaxine tablets What is this medicine? VENLAFAXINE (VEN la fax een) is used to treat depression, anxiety and panic disorder. This medicine may be used for other purposes; ask your health care provider or pharmacist if you have questions. COMMON BRAND NAME(S): Effexor What should I tell my health care provider before I take this medicine? They need to know if you have any of these conditions: -bleeding disorders -glaucoma -heart disease -high blood pressure -high cholesterol -kidney disease -liver disease -low levels of sodium in the blood -mania or bipolar disorder -seizures -suicidal thoughts, plans, or attempt; a previous suicide attempt by you or a family -take medicines that treat or prevent blood clots -thyroid disease -an unusual or allergic reaction to venlafaxine, desvenlafaxine, other medicines, foods, dyes, or preservatives -pregnant or trying to get pregnant -breast-feeding How should I use this medicine? Take this medicine by mouth with a glass of water. Follow the directions on the prescription label. Take it with food. Take your medicine at regular intervals. Do not take your medicine more often than directed. Do not stop taking this medicine suddenly except upon the advice of your doctor. Stopping this medicine too quickly may cause serious side effects or your condition may worsen. A special MedGuide will be given to you by the pharmacist with each prescription and refill. Be sure to read this information carefully each time. Talk to your pediatrician regarding the use of this medicine in children. Special care may be needed. Overdosage: If you think you have taken too much of this medicine contact a poison control center or emergency room at once. NOTE: This medicine is only for you. Do not share this medicine with others. What if I miss a dose? If you  miss a dose, take it as soon as you can. If it is almost time for your next dose, take only that dose. Do not take double or extra doses. What may interact with this medicine? Do not take this medicine with any of the following medications: -certain medicines for fungal infections like fluconazole, itraconazole, ketoconazole, posaconazole, voriconazole -cisapride -desvenlafaxine -dofetilide -dronedarone -duloxetine -levomilnacipran -linezolid -MAOIs like Carbex, Eldepryl, Marplan, Nardil, and Parnate -methylene blue (injected into a vein) -milnacipran -pimozide -thioridazine -ziprasidone This medicine may also interact with the following medications: -amphetamines -aspirin and aspirin-like medicines -certain medicines for depression, anxiety, or psychotic disturbances -certain medicines for migraine headaches like almotriptan, eletriptan, frovatriptan, naratriptan, rizatriptan, sumatriptan, zolmitriptan -certain medicines for sleep -certain medicines that treat or prevent blood clots like dalteparin, enoxaparin, warfarin -cimetidine -clozapine -diuretics -fentanyl -furazolidone -indinavir -isoniazid -lithium -metoprolol -NSAIDS, medicines for pain and inflammation, like ibuprofen or naproxen -other medicines that prolong the QT interval (cause an abnormal heart rhythm) -procarbazine -rasagiline -supplements like St. John's wort, kava kava, valerian -tramadol -tryptophan This list may not describe all possible interactions. Give your health care provider a list of all the medicines, herbs, non-prescription drugs, or dietary supplements you use. Also tell them if you smoke, drink alcohol, or use illegal drugs. Some items may interact with your medicine. What should I watch for while using this medicine? Tell your doctor if your symptoms do not get better or if they get worse. Visit your doctor or health care professional for regular checks on your progress. Because it may take  several weeks to see the full effects of this medicine, it is important to continue your treatment as  prescribed by your doctor. Patients and their families should watch out for new or worsening thoughts of suicide or depression. Also watch out for sudden changes in feelings such as feeling anxious, agitated, panicky, irritable, hostile, aggressive, impulsive, severely restless, overly excited and hyperactive, or not being able to sleep. If this happens, especially at the beginning of treatment or after a change in dose, call your health care professional. This medicine can cause an increase in blood pressure. Check with your doctor for instructions on monitoring your blood pressure while taking this medicine. You may get drowsy or dizzy. Do not drive, use machinery, or do anything that needs mental alertness until you know how this medicine affects you. Do not stand or sit up quickly, especially if you are an older patient. This reduces the risk of dizzy or fainting spells. Alcohol may interfere with the effect of this medicine. Avoid alcoholic drinks. Your mouth may get dry. Chewing sugarless gum, sucking hard candy and drinking plenty of water will help. Contact your doctor if the problem does not go away or is severe. What side effects may I notice from receiving this medicine? Side effects that you should report to your doctor or health care professional as soon as possible: -allergic reactions like skin rash, itching or hives, swelling of the face, lips, or tongue -anxious -breathing problems -confusion -changes in vision -chest pain -confusion -elevated mood, decreased need for sleep, racing thoughts, impulsive behavior -eye pain -fast, irregular heartbeat -feeling faint or lightheaded, falls -feeling agitated, angry, or irritable -hallucination, loss of contact with reality -high blood pressure -loss of balance or coordination -palpitations -redness, blistering, peeling or loosening of  the skin, including inside the mouth -restlessness, pacing, inability to keep still -seizures -stiff muscles -suicidal thoughts or other mood changes -trouble passing urine or change in the amount of urine -trouble sleeping -unusual bleeding or bruising -unusually weak or tired -vomiting Side effects that usually do not require medical attention (report to your doctor or health care professional if they continue or are bothersome): -change in sex drive or performance -change in appetite or weight -constipation -dizziness -dry mouth -headache -increased sweating -nausea -tired This list may not describe all possible side effects. Call your doctor for medical advice about side effects. You may report side effects to FDA at 1-800-FDA-1088. Where should I keep my medicine? Keep out of the reach of children. Store at a controlled temperature between 20 and 25 degrees C (68 and 77 degrees F), in a dry place. Throw away any unused medicine after the expiration date. NOTE: This sheet is a summary. It may not cover all possible information. If you have questions about this medicine, talk to your doctor, pharmacist, or health care provider.  2019 Elsevier/Gold Standard (2016-03-28 18:42:26)

## 2019-03-31 NOTE — Assessment & Plan Note (Signed)
New onset with current Covid 19 pandemic.  Will trial low dose 37.5 MG Effexor, script sent.  She denies SI/HI or recent alcohol use.  Last NA+ was 131, she refuses lab draw today due to cost and denies symptoms.  Discussed with her at length.  Return in 4 weeks for follow-up mood.

## 2019-04-28 ENCOUNTER — Ambulatory Visit (INDEPENDENT_AMBULATORY_CARE_PROVIDER_SITE_OTHER): Payer: Self-pay | Admitting: Nurse Practitioner

## 2019-04-28 ENCOUNTER — Other Ambulatory Visit: Payer: Self-pay | Admitting: Nurse Practitioner

## 2019-04-28 ENCOUNTER — Other Ambulatory Visit: Payer: Self-pay

## 2019-04-28 ENCOUNTER — Encounter: Payer: Self-pay | Admitting: Nurse Practitioner

## 2019-04-28 VITALS — BP 94/61 | HR 70 | Temp 98.5°F

## 2019-04-28 DIAGNOSIS — Z1231 Encounter for screening mammogram for malignant neoplasm of breast: Secondary | ICD-10-CM

## 2019-04-28 DIAGNOSIS — F320 Major depressive disorder, single episode, mild: Secondary | ICD-10-CM

## 2019-04-28 MED ORDER — VENLAFAXINE HCL 50 MG PO TABS: 50.0000 mg | ORAL_TABLET | Freq: Every day | ORAL | 5 refills | Status: DC

## 2019-04-28 MED ORDER — HYDROXYZINE PAMOATE 25 MG PO CAPS: 25.0000 mg | ORAL_CAPSULE | Freq: Three times a day (TID) | ORAL | 1 refills | Status: DC | PRN

## 2019-04-28 NOTE — Patient Instructions (Signed)

## 2019-04-28 NOTE — Progress Notes (Signed)
BP 94/61   Pulse 70   Temp 98.5 F (36.9 C) (Oral)   SpO2 99%    Subjective:    Patient ID: Patty Cruz, female    DOB: 26-Aug-1956, 63 y.o.   MRN: [id number]  HPI: Patty Cruz is a 62 y.o. female  Chief Complaint  Patient presents with  . Depression    4 week f/up   DEPRESSION Started on Effexor at last visit 37.5 MG.   Reports some improvement in mood, still some anxiety.  Has occasional panic attacks, states 6 in the last month.  Not every day.  More when she is at home or driving and not working, as she gets into her own head.   Mood status: stable Satisfied with current treatment?: yes Symptom severity: mild  Duration of current treatment : months Side effects: no Medication compliance: good compliance Psychotherapy/counseling: none Previous psychiatric medications: Effexor Depressed mood: yes Anxious mood: yes Anhedonia: no Significant weight loss or gain: no Insomnia: yes hard to stay asleep Fatigue: no Feelings of worthlessness or guilt: no Impaired concentration/indecisiveness: yes Suicidal ideations: no Hopelessness: no Crying spells: yes Depression screen [facility] 2/9 04/28/2019 03/31/2019 08/20/2018  Decreased Interest _0 Down, Depressed, Hopeless _1 PHQ - 2 Score _2 Altered sleeping _3 Tired, decreased energy 3 0 3  Change in appetite 0 1 0  Feeling bad or failure about yourself  1 0 0  Trouble concentrating 1 0 0  Moving slowly or fidgety/restless 0 0 0  Suicidal thoughts 0 0 0  PHQ-9 Score _4 Difficult doing work/chores Somewhat difficult Somewhat difficult Not difficult at all   GAD 7 : Generalized Anxiety Score 04/28/2019 03/31/2019 08/20/2018  Nervous, Anxious, on Edge 1 2 0  Control/stop worrying 0 2 0  Worry too much - different things 0 2 0  Trouble relaxing 1 0 1  Restless 0 0 1  Easily annoyed or irritable 0 0 0  Afraid - awful might happen 0 1 0  Total GAD 7 Score _5 Anxiety Difficulty Somewhat difficult  Somewhat difficult -     Relevant past medical, surgical, family and social history reviewed and updated as indicated. Interim medical history since our last visit reviewed. Allergies and medications reviewed and updated.  Review of Systems  Constitutional: Negative for activity change, appetite change, diaphoresis, fatigue and fever.  Respiratory: Negative for cough, chest tightness and shortness of breath.   Cardiovascular: Negative for chest pain, palpitations and leg swelling.  Gastrointestinal: Negative for abdominal distention, abdominal pain, constipation, diarrhea, nausea and vomiting.  Neurological: Negative for dizziness, syncope, weakness, light-headedness, numbness and headaches.  Psychiatric/Behavioral: Positive for decreased concentration and sleep disturbance. Negative for self-injury and suicidal ideas.    Per HPI unless specifically indicated above     Objective:    BP 94/61   Pulse 70   Temp 98.5 F (36.9 C) (Oral)   SpO2 99%   Wt Readings from Last 3 Encounters:  03/31/19 119 lb (54 kg)  08/20/18 113 lb 9.6 oz (51.5 kg)  04/01/16 110 lb (49.9 kg)    Physical Exam Vitals signs and nursing note reviewed.  Constitutional:      General: She is awake. She is not in acute distress.    Appearance: She is well-developed. She is not ill-appearing.  HENT:     Head: Normocephalic.     Right Ear: Hearing normal.  Left Ear: Hearing normal.     Nose: Nose normal.     Mouth/Throat:     Mouth: Mucous membranes are moist.  Eyes:     General: Lids are normal.        Right eye: No discharge.        Left eye: No discharge.     Conjunctiva/sclera: Conjunctivae normal.     Pupils: Pupils are equal, round, and reactive to light.  Neck:     Musculoskeletal: Normal range of motion and neck supple.  Cardiovascular:     Rate and Rhythm: Normal rate and regular rhythm.     Heart sounds: Normal heart sounds. No murmur. No gallop.   Pulmonary:     Effort: Pulmonary  effort is normal.     Breath sounds: Normal breath sounds.  Abdominal:     General: Bowel sounds are normal.     Palpations: Abdomen is soft.  Musculoskeletal:     Right lower leg: No edema.     Left lower leg: No edema.  Skin:    General: Skin is warm and dry.  Neurological:     Mental Status: She is alert and oriented to person, place, and time.  Psychiatric:        Attention and Perception: Attention normal.        Mood and Affect: Mood normal.        Behavior: Behavior normal. Behavior is cooperative.        Thought Content: Thought content normal.        Judgment: Judgment normal.     Results for orders placed or performed in visit on 08/20/18  CBC  Result Value Ref Range   WBC 7.2 3.4 - 10.8 x10E3/uL   RBC 4.19 3.77 - 5.28 x10E6/uL   Hemoglobin 12.9 11.1 - 15.9 g/dL   Hematocrit 38.4 34.0 - 46.6 %   MCV 92 79 - 97 fL   MCH 30.8 26.6 - 33.0 pg   MCHC 33.6 31.5 - 35.7 g/dL   RDW 11.8 (L) 12.3 - 15.4 %   Platelets 397 150 - 450 x10E3/uL  B12  Result Value Ref Range   Vitamin B-12 971 232 - 1,245 pg/mL  TSH  Result Value Ref Range   TSH 4.310 0.450 - 4.500 uIU/mL  Comp Met (CMET)  Result Value Ref Range   Glucose 89 65 - 99 mg/dL   BUN 14 8 - 27 mg/dL   Creatinine, Ser 0.65 0.57 - 1.00 mg/dL   GFR calc non Af Amer 95 >59 mL/min/1.73   GFR calc Af Amer 110 >59 mL/min/1.73   BUN/Creatinine Ratio 22 12 - 28   Sodium 131 (L) 134 - 144 mmol/L   Potassium 5.3 (H) 3.5 - 5.2 mmol/L   Chloride 93 (L) 96 - 106 mmol/L   CO2 24 20 - 29 mmol/L   Calcium 9.6 8.7 - 10.3 mg/dL   Total Protein 7.7 6.0 - 8.5 g/dL   Albumin 4.9 (H) 3.6 - 4.8 g/dL   Globulin, Total 2.8 1.5 - 4.5 g/dL   Albumin/Globulin Ratio 1.8 1.2 - 2.2   Bilirubin Total 0.3 0.0 - 1.2 mg/dL   Alkaline Phosphatase 142 (H) 39 - 117 IU/L   AST 41 (H) 0 - 40 IU/L   ALT 31 0 - 32 IU/L  Ferritin  Result Value Ref Range   Ferritin 111 15 - 150 ng/mL  Iron  Result Value Ref Range   Iron 78 27 - 139 ug/dL  Assessment & Plan:   Problem List Items Addressed This Visit      Other   Depression, major, single episode, mild ([place]) - Primary    Ongoing, some improvement.  Increase Effexor to 50 MG daily and add on Vistaril as needed for panic attacks and sleep.  She denies SI/HI or recent alcohol use.  Check NA+ today.  Return in 4 weeks for follow-up.      Relevant Medications   venlafaxine (EFFEXOR) 50 MG tablet   hydrOXYzine (VISTARIL) 25 MG capsule   Other Relevant Orders   Sodium       Follow up plan: Return in about 4 weeks (around 05/26/2019) for Mood follow-up.

## 2019-04-28 NOTE — Assessment & Plan Note (Signed)
Ongoing, some improvement.  Increase Effexor to 50 MG daily and add on Vistaril as needed for panic attacks and sleep.  She denies SI/HI or recent alcohol use.  Check NA+ today.  Return in 4 weeks for follow-up.

## 2019-04-29 LAB — SODIUM: Sodium: 132 mmol/L — ABNORMAL LOW (ref 134–144)

## 2019-05-26 ENCOUNTER — Ambulatory Visit: Payer: Self-pay | Admitting: Nurse Practitioner

## 2019-06-09 ENCOUNTER — Ambulatory Visit: Payer: Self-pay | Admitting: Nurse Practitioner

## 2019-07-20 ENCOUNTER — Encounter: Payer: Self-pay | Admitting: Nurse Practitioner

## 2019-07-20 ENCOUNTER — Other Ambulatory Visit: Payer: Self-pay

## 2019-07-20 ENCOUNTER — Ambulatory Visit (INDEPENDENT_AMBULATORY_CARE_PROVIDER_SITE_OTHER): Payer: BC Managed Care – PPO | Admitting: Nurse Practitioner

## 2019-07-20 DIAGNOSIS — F320 Major depressive disorder, single episode, mild: Secondary | ICD-10-CM | POA: Diagnosis not present

## 2019-07-20 MED ORDER — VENLAFAXINE HCL 50 MG PO TABS: 50.0000 mg | ORAL_TABLET | Freq: Every day | ORAL | 3 refills | Status: DC

## 2019-07-20 NOTE — Progress Notes (Signed)
BP 116/66   Pulse 73   Temp (!) 97.4 F (36.3 C) (Oral)   Ht 5\' 4"  (1.626 m)   Wt 117 lb (53.1 kg)   SpO2 100%   BMI 20.08 kg/m    Subjective:    Patient ID: Patty Cruz, female    DOB: 1956/08/10, 63 y.o.   MRN: [id number]  HPI: Patty Cruz is a 63 y.o. female  Chief Complaint  Patient presents with  . Depression   DEPRESSION Increased Effexor to 50 MG daily at last visit.  Reports she feels "a lot better.  Has not had to use Vistaril. Mood status: stable Satisfied with current treatment?: yes Symptom severity: mild  Duration of current: chronic Previous psychiatric medications: Efexor Depressed mood: no Anxious mood: no Anhedonia: no Significant weight loss or gain: no Insomnia: no none Fatigue: no Feelings of worthlessness or guilt: no Impaired concentration/indecisiveness: no Suicidal ideations: no Hopelessness: no Crying spells: no Depression screen [facility] 2/9 07/20/2019 04/28/2019 03/31/2019 08/20/2018  Decreased Interest 0 1 2 1   Down, Depressed, Hopeless 0 1 2 1   PHQ - 2 Score 0 2 4 2   Altered sleeping 3 3 3 3   Tired, decreased energy 3 3 0 3  Change in appetite 0 0 1 0  Feeling bad or failure about yourself  1 1 0 0  Trouble concentrating 0 1 0 0  Moving slowly or fidgety/restless 0 0 0 0  Suicidal thoughts 0 0 0 0  PHQ-9 Score 7 10 8 8   Difficult doing work/chores Not difficult at all Somewhat difficult Somewhat difficult Not difficult at all    Relevant past medical, surgical, family and social history reviewed and updated as indicated. Interim medical history since our last visit reviewed. Allergies and medications reviewed and updated.  Review of Systems  Constitutional: Negative for activity change, appetite change, diaphoresis, fatigue and fever.  Respiratory: Negative for cough, chest tightness and shortness of breath.   Cardiovascular: Negative for chest pain, palpitations and leg swelling.  Gastrointestinal: Negative for abdominal  distention, abdominal pain, constipation, diarrhea, nausea and vomiting.  Neurological: Negative for dizziness, syncope, weakness, light-headedness, numbness and headaches.  Psychiatric/Behavioral: Negative for decreased concentration, self-injury, sleep disturbance and suicidal ideas.    Per HPI unless specifically indicated above     Objective:    BP 116/66   Pulse 73   Temp (!) 97.4 F (36.3 C) (Oral)   Ht 5\' 4"  (1.626 m)   Wt 117 lb (53.1 kg)   SpO2 100%   BMI 20.08 kg/m   Wt Readings from Last 3 Encounters:  07/20/19 117 lb (53.1 kg)  03/31/19 119 lb (54 kg)  08/20/18 113 lb 9.6 oz (51.5 kg)    Physical Exam Vitals signs and nursing note reviewed.  Constitutional:      General: She is awake. She is not in acute distress.    Appearance: She is well-developed. She is not ill-appearing.  HENT:     Head: Normocephalic.     Right Ear: Hearing normal.     Left Ear: Hearing normal.  Eyes:     General: Lids are normal.        Right eye: No discharge.        Left eye: No discharge.     Conjunctiva/sclera: Conjunctivae normal.     Pupils: Pupils are equal, round, and reactive to light.  Neck:     Musculoskeletal: Normal range of motion and neck supple.     Thyroid:  No thyromegaly.     Vascular: No carotid bruit.  Cardiovascular:     Rate and Rhythm: Normal rate and regular rhythm.     Heart sounds: Normal heart sounds. No murmur. No gallop.   Pulmonary:     Effort: Pulmonary effort is normal. No accessory muscle usage or respiratory distress.     Breath sounds: Normal breath sounds.  Abdominal:     General: Bowel sounds are normal.     Palpations: Abdomen is soft.  Musculoskeletal:     Right lower leg: No edema.     Left lower leg: No edema.  Skin:    General: Skin is warm and dry.  Neurological:     Mental Status: She is alert and oriented to person, place, and time.  Psychiatric:        Attention and Perception: Attention normal.        Mood and Affect: Mood  normal.        Speech: Speech normal.        Behavior: Behavior normal. Behavior is cooperative.        Thought Content: Thought content normal.        Judgment: Judgment normal.     Results for orders placed or performed in visit on 04/28/19  Sodium  Result Value Ref Range   Sodium 132 (L) 134 - 144 mmol/L      Assessment & Plan:   Problem List Items Addressed This Visit      Other   Depression, major, single episode, mild (HCC)    Chronic, stable with current Effexor dose.  Continue current medication regimen and adjust as needed.  Refuses labs today.  Return in 6 months.      Relevant Medications   venlafaxine (EFFEXOR) 50 MG tablet       Follow up plan: Return in about 6 months (around 01/17/2020) for Mood follow-up.

## 2019-07-20 NOTE — Assessment & Plan Note (Signed)
Chronic, stable with current Effexor dose.  Continue current medication regimen and adjust as needed.  Refuses labs today.  Return in 6 months.

## 2019-07-20 NOTE — Patient Instructions (Signed)
Living With Depression Everyone experiences occasional disappointment, sadness, and loss in their lives. When you are feeling down, blue, or sad for at least 2 weeks in a row, it may mean that you have depression. Depression can affect your thoughts and feelings, relationships, daily activities, and physical health. It is caused by changes in the way your brain functions. If you receive a diagnosis of depression, your health care provider will tell you which type of depression you have and what treatment options are available to you. If you are living with depression, there are ways to help you recover from it and also ways to prevent it from coming back. How to cope with lifestyle changes Coping with stress     Stress is your body's reaction to life changes and events, both good and bad. Stressful situations may include:  Getting married.  The death of a spouse.  Losing a job.  Retiring.  Having a baby. Stress can last just a few hours or it can be ongoing. Stress can play a major role in depression, so it is important to learn both how to cope with stress and how to think about it differently. Talk with your health care provider or a counselor if you would like to learn more about stress reduction. He or she may suggest some stress reduction techniques, such as:  Music therapy. This can include creating music or listening to music. Choose music that you enjoy and that inspires you.  Mindfulness-based meditation. This kind of meditation can be done while sitting or walking. It involves being aware of your normal breaths, rather than trying to control your breathing.  Centering prayer. This is a kind of meditation that involves focusing on a spiritual word or phrase. Choose a word, phrase, or sacred image that is meaningful to you and that brings you peace.  Deep breathing. To do this, expand your stomach and inhale slowly through your nose. Hold your breath for 3-5 seconds, then exhale  slowly, allowing your stomach muscles to relax.  Muscle relaxation. This involves intentionally tensing muscles then relaxing them. Choose a stress reduction technique that fits your lifestyle and personality. Stress reduction techniques take time and practice to develop. Set aside 5-15 minutes a day to do them. Therapists can offer training in these techniques. The training may be covered by some insurance plans. Other things you can do to manage stress include:  Keeping a stress diary. This can help you learn what triggers your stress and ways to control your response.  Understanding what your limits are and saying no to requests or events that lead to a schedule that is too full.  Thinking about how you respond to certain situations. You may not be able to control everything, but you can control how you react.  Adding humor to your life by watching funny films or TV shows.  Making time for activities that help you relax and not feeling guilty about spending your time this way.  Medicines Your health care provider may suggest certain medicines if he or she feels that they will help improve your condition. Avoid using alcohol and other substances that may prevent your medicines from working properly (may interact). It is also important to:  Talk with your pharmacist or health care provider about all the medicines that you take, their possible side effects, and what medicines are safe to take together.  Make it your goal to take part in all treatment decisions (shared decision-making). This includes giving input on   the side effects of medicines. It is best if shared decision-making with your health care provider is part of your total treatment plan. If your health care provider prescribes a medicine, you may not notice the full benefits of it for 4-8 weeks. Most people who are treated for depression need to be on medicine for at least 6-12 months after they feel better. If you are taking  medicines as part of your treatment, do not stop taking medicines without first talking to your health care provider. You may need to have the medicine slowly decreased (tapered) over time to decrease the risk of harmful side effects. Relationships Your health care provider may suggest family therapy along with individual therapy and drug therapy. While there may not be family problems that are causing you to feel depressed, it is still important to make sure your family learns as much as they can about your mental health. Having your family's support can help make your treatment successful. How to recognize changes in your condition Everyone has a different response to treatment for depression. Recovery from major depression happens when you have not had signs of major depression for two months. This may mean that you will start to:  Have more interest in doing activities.  Feel less hopeless than you did 2 months ago.  Have more energy.  Overeat less often, or have better or improving appetite.  Have better concentration. Your health care provider will work with you to decide the next steps in your recovery. It is also important to recognize when your condition is getting worse. Watch for these signs:  Having fatigue or low energy.  Eating too much or too little.  Sleeping too much or too little.  Feeling restless, agitated, or hopeless.  Having trouble concentrating or making decisions.  Having unexplained physical complaints.  Feeling irritable, angry, or aggressive. Get help as soon as you or your family members notice these symptoms coming back. How to get support and help from others How to talk with friends and family members about your condition  Talking to friends and family members about your condition can provide you with one way to get support and guidance. Reach out to trusted friends or family members, explain your symptoms to them, and let them know that you are  working with a health care provider to treat your depression. Financial resources Not all insurance plans cover mental health care, so it is important to check with your insurance carrier. If paying for co-pays or counseling services is a problem, search for a local or county mental health care center. They may be able to offer public mental health care services at low or no cost when you are not able to see a private health care provider. If you are taking medicine for depression, you may be able to get the generic form, which may be less expensive. Some makers of prescription medicines also offer help to patients who cannot afford the medicines they need. Follow these instructions at home:   Get the right amount and quality of sleep.  Cut down on using caffeine, tobacco, alcohol, and other potentially harmful substances.  Try to exercise, such as walking or lifting small weights.  Take over-the-counter and prescription medicines only as told by your health care provider.  Eat a healthy diet that includes plenty of vegetables, fruits, whole grains, low-fat dairy products, and lean protein. Do not eat a lot of foods that are high in solid fats, added sugars, or salt.    Keep all follow-up visits as told by your health care provider. This is important. Contact a health care provider if:  You stop taking your antidepressant medicines, and you have any of these symptoms: ? Nausea. ? Headache. ? Feeling lightheaded. ? Chills and body aches. ? Not being able to sleep (insomnia).  You or your friends and family think your depression is getting worse. Get help right away if:  You have thoughts of hurting yourself or others. If you ever feel like you may hurt yourself or others, or have thoughts about taking your own life, get help right away. You can go to your nearest emergency department or call:  Your local emergency services (911 in the [place]  A suicide crisis helpline, such as the  National Suicide Prevention Lifeline at 1-800-273-8255. This is open 24-hours a day. Summary  If you are living with depression, there are ways to help you recover from it and also ways to prevent it from coming back.  Work with your health care team to create a management plan that includes counseling, stress management techniques, and healthy lifestyle habits. This information is not intended to replace advice given to you by your health care provider. Make sure you discuss any questions you have with your health care provider. Document Released: 09/30/2016 Document Revised: 02/19/2019 Document Reviewed: 09/30/2016 Elsevier Patient Education  2020 [employer]  

## 2019-09-03 ENCOUNTER — Other Ambulatory Visit: Payer: BC Managed Care – PPO

## 2019-09-03 ENCOUNTER — Other Ambulatory Visit: Payer: Self-pay

## 2019-09-03 ENCOUNTER — Other Ambulatory Visit: Payer: Self-pay | Admitting: Nurse Practitioner

## 2019-09-03 DIAGNOSIS — E871 Hypo-osmolality and hyponatremia: Secondary | ICD-10-CM | POA: Diagnosis not present

## 2019-09-03 DIAGNOSIS — R5382 Chronic fatigue, unspecified: Secondary | ICD-10-CM

## 2019-09-04 LAB — COMPREHENSIVE METABOLIC PANEL
ALT: 34 IU/L — ABNORMAL HIGH (ref 0–32)
AST: 50 IU/L — ABNORMAL HIGH (ref 0–40)
Albumin/Globulin Ratio: 2 (ref 1.2–2.2)
Albumin: 5 g/dL — ABNORMAL HIGH (ref 3.8–4.8)
Alkaline Phosphatase: 147 IU/L — ABNORMAL HIGH (ref 39–117)
BUN/Creatinine Ratio: 12 (ref 12–28)
BUN: 10 mg/dL (ref 8–27)
Bilirubin Total: 0.2 mg/dL (ref 0.0–1.2)
CO2: 26 mmol/L (ref 20–29)
Calcium: 9.6 mg/dL (ref 8.7–10.3)
Chloride: 98 mmol/L (ref 96–106)
Creatinine, Ser: 0.82 mg/dL (ref 0.57–1.00)
GFR calc Af Amer: 87 mL/min/{1.73_m2} (ref >59)
GFR calc non Af Amer: 76 mL/min/{1.73_m2} (ref >59)
Globulin, Total: 2.5 g/dL (ref 1.5–4.5)
Glucose: 90 mg/dL (ref 65–99)
Potassium: 5.3 mmol/L — ABNORMAL HIGH (ref 3.5–5.2)
Sodium: 137 mmol/L (ref 134–144)
Total Protein: 7.5 g/dL (ref 6.0–8.5)

## 2019-09-07 ENCOUNTER — Other Ambulatory Visit: Payer: Self-pay | Admitting: Nurse Practitioner

## 2019-09-07 DIAGNOSIS — N644 Mastodynia: Secondary | ICD-10-CM

## 2019-09-07 DIAGNOSIS — R5382 Chronic fatigue, unspecified: Secondary | ICD-10-CM

## 2019-09-07 NOTE — Progress Notes (Signed)
Future lab order  

## 2019-09-08 ENCOUNTER — Other Ambulatory Visit: Payer: Self-pay

## 2019-09-08 ENCOUNTER — Other Ambulatory Visit: Payer: BC Managed Care – PPO

## 2019-09-08 DIAGNOSIS — R5382 Chronic fatigue, unspecified: Secondary | ICD-10-CM | POA: Diagnosis not present

## 2019-09-11 LAB — CBC WITH DIFFERENTIAL/PLATELET
Basophils Absolute: 0.1 10*3/uL (ref 0.0–0.2)
Basos: 1 %
EOS (ABSOLUTE): 0.1 10*3/uL (ref 0.0–0.4)
Eos: 2 %
Hemoglobin: 12.5 g/dL (ref 11.1–15.9)
Immature Grans (Abs): 0 10*3/uL (ref 0.0–0.1)
Immature Granulocytes: 0 %
Lymphocytes Absolute: 1 10*3/uL (ref 0.7–3.1)
Lymphs: 22 %
MCH: 32.6 pg (ref 26.6–33.0)
MCHC: 33.5 g/dL (ref 31.5–35.7)
MCV: 97 fL (ref 79–97)
Monocytes Absolute: 0.4 10*3/uL (ref 0.1–0.9)
Monocytes: 10 %
Neutrophils Absolute: 2.9 10*3/uL (ref 1.4–7.0)
Neutrophils: 65 %
Platelets: 336 10*3/uL (ref 150–450)
RBC: 3.83 x10E6/uL (ref 3.77–5.28)
RDW: 11.6 % — ABNORMAL LOW (ref 11.7–15.4)
WBC: 4.5 10*3/uL (ref 3.4–10.8)

## 2019-09-11 LAB — ANEMIA PANEL
Ferritin: 116 ng/mL (ref 15–150)
Folate, Hemolysate: 463 ng/mL
Folate, RBC: 1241 ng/mL (ref >498)
Hematocrit: 37.3 % (ref 34.0–46.6)
Iron Saturation: 43 % (ref 15–55)
Iron: 108 ug/dL (ref 27–139)
Retic Ct Pct: 1.2 % (ref 0.6–2.6)
Total Iron Binding Capacity: 254 ug/dL (ref 250–450)
UIBC: 146 ug/dL (ref 118–369)
Vitamin B-12: 698 pg/mL (ref 232–1245)

## 2019-09-11 LAB — TSH: TSH: 5.88 u[IU]/mL — ABNORMAL HIGH (ref 0.450–4.500)

## 2019-09-21 ENCOUNTER — Telehealth: Payer: Self-pay | Admitting: Nurse Practitioner

## 2019-09-21 DIAGNOSIS — N644 Mastodynia: Secondary | ICD-10-CM

## 2019-09-21 NOTE — Telephone Encounter (Signed)
Copied from [place] [id number]. Topic: General - Other >> Sep 21, 2019 11:20 AM Parke Poisson wrote: Reason for CRM: Per [facility] when ordering diagnostic they will need clock position of pain and always has to be TOMO.If she has not had one done in years it will need to be bilateral.If no clock position change to screening,Thanks

## 2019-09-21 NOTE — Telephone Encounter (Signed)
New order placed

## 2019-10-05 ENCOUNTER — Telehealth: Payer: Self-pay | Admitting: Nurse Practitioner

## 2019-10-05 DIAGNOSIS — N644 Mastodynia: Secondary | ICD-10-CM

## 2019-10-05 NOTE — Telephone Encounter (Signed)
Orders placed.

## 2019-10-05 NOTE — Telephone Encounter (Signed)
Copied from [place] [id number]. Topic: General - Other >> Oct 04, 2019  4:25 PM Parke Poisson wrote: Reason for CRM: Per [facility] they will need an order for both left & right breast limited ultrasounds [id number] & [id number]

## 2019-10-14 ENCOUNTER — Other Ambulatory Visit: Payer: Self-pay

## 2019-10-18 ENCOUNTER — Ambulatory Visit
Admission: RE | Admit: 2019-10-18 | Discharge: 2019-10-18 | Disposition: A | Discharge status: Home / Self Care | Payer: BC Managed Care – PPO | Source: Ambulatory Visit | Attending: Nurse Practitioner | Admitting: Nurse Practitioner

## 2019-10-18 DIAGNOSIS — N644 Mastodynia: Secondary | ICD-10-CM

## 2019-10-18 DIAGNOSIS — R922 Inconclusive mammogram: Secondary | ICD-10-CM | POA: Diagnosis not present

## 2019-12-16 ENCOUNTER — Ambulatory Visit (INDEPENDENT_AMBULATORY_CARE_PROVIDER_SITE_OTHER): Payer: BC Managed Care – PPO | Admitting: Nurse Practitioner

## 2019-12-16 ENCOUNTER — Encounter: Payer: Self-pay | Admitting: Nurse Practitioner

## 2019-12-16 DIAGNOSIS — R7989 Other specified abnormal findings of blood chemistry: Secondary | ICD-10-CM | POA: Insufficient documentation

## 2019-12-16 DIAGNOSIS — E871 Hypo-osmolality and hyponatremia: Secondary | ICD-10-CM | POA: Insufficient documentation

## 2019-12-16 DIAGNOSIS — R5382 Chronic fatigue, unspecified: Secondary | ICD-10-CM

## 2019-12-16 DIAGNOSIS — F320 Major depressive disorder, single episode, mild: Secondary | ICD-10-CM | POA: Diagnosis not present

## 2019-12-16 NOTE — Assessment & Plan Note (Signed)
Chronic since 2016-2017, at time she was heavily drinking alcohol.  Has now been in remission for some time and continues with lower NA+ levels.  Will recheck outpatient.  Continue daily supplement.  Monitor closely and avoid medications which may exacerbate this.

## 2019-12-16 NOTE — Assessment & Plan Note (Signed)
Noted on past labs, has ongoing fatigue.  Will recheck this outpatient, along with T4 level.  If continued elevations noted will initiate medication and have patient return in 6 weeks.

## 2019-12-16 NOTE — Assessment & Plan Note (Signed)
Chronic, ongoing with no anemia noted on past labs.  Low NA+ at baseline and on recent labs elevation in TSH noted.  Will recheck this outpatient and start treatment if indicated.  Return in 3 months or sooner if Levothyroxine initiated.

## 2019-12-16 NOTE — Progress Notes (Signed)
There were no vitals taken for this visit.   Subjective:    Patient ID: Patty Cruz, female    DOB: 1956-05-20, 64 y.o.   MRN: [id number]  HPI: Patty Cruz is a 64 y.o. female  Chief Complaint  Patient presents with  . Depression  . Follow-up    TSH recheck    . This visit was completed via FaceTime due to the restrictions of the COVID-19 pandemic. All issues as above were discussed and addressed. Physical exam was done as above through visual confirmation on FaceTime. If it was felt that the patient should be evaluated in the office, they were directed there. The patient verbally consented to this visit. . Location of the patient: home . Location of the provider: home . Those involved with this call:  . Provider: Aura Dials, DNP . CMA: Wilhemena Durie, CMA . Front Desk/Registration: Adela Ports  . Time spent on call: 15 minutes with patient face to face via video conference. More than 50% of this time was spent in counseling and coordination of care. 10 minutes total spent in review of patient's record and preparation of their chart.  . I verified patient identity using two factors (patient name and date of birth). Patient consents verbally to being seen via telemedicine visit today.    DEPRESSION Continues on Effexor 25 MG daily and Vistaril as needed.  Is also taking a sodium chloride tablet due to history of low sodium levels ranging from 131 to 133 since 2017.  Does endorse winter months are tougher on her mood.   Mood status: stable Satisfied with current treatment?: yes Symptom severity: mild  Duration of current treatment : chronic Side effects: no Medication compliance: good compliance Psychotherapy/counseling: none Previous psychiatric medications: Effexor Depressed mood: yes Anxious mood: no Anhedonia: no Significant weight loss or gain: no Insomnia: sleeping better, sleeping too much Fatigue: no Feelings of worthlessness or guilt: no Impaired  concentration/indecisiveness: no Suicidal ideations: no Hopelessness: no Crying spells: no Depression screen [facility] 2/9 12/16/2019 07/20/2019 04/28/2019 03/31/2019 08/20/2018  Decreased Interest 0 0 1 2 1   Down, Depressed, Hopeless 0 0 1 2 1   PHQ - 2 Score 0 0 2 4 2   Altered sleeping 1 3 3 3 3   Tired, decreased energy 3 3 3  0 3  Change in appetite 0 0 0 1 0  Feeling bad or failure about yourself  0 1 1 0 0  Trouble concentrating 1 0 1 0 0  Moving slowly or fidgety/restless 0 0 0 0 0  Suicidal thoughts 0 0 0 0 0  PHQ-9 Score 5 7 10 8 8   Difficult doing work/chores Somewhat difficult Not difficult at all Somewhat difficult Somewhat difficult Not difficult at all   GAD 7 : Generalized Anxiety Score 04/28/2019 03/31/2019 08/20/2018  Nervous, Anxious, on Edge 1 2 0  Control/stop worrying 0 2 0  Worry too much - different things 0 2 0  Trouble relaxing 1 0 1  Restless 0 0 1  Easily annoyed or irritable 0 0 0  Afraid - awful might happen 0 1 0  Total GAD 7 Score 2 7 2   Anxiety Difficulty Somewhat difficult Somewhat difficult -    HYPOTHYROIDISM Last labs noted TSH 5.880.  She is aware of need for recheck with T4.  No current medications. Fatigue: yes Cold intolerance: hands stay cold all the time Heat intolerance: no Weight gain: no Weight loss: no Constipation: no Diarrhea/loose stools: no Palpitations: no Lower extremity  edema: no Anxiety/depressed mood: yes  Relevant past medical, surgical, family and social history reviewed and updated as indicated. Interim medical history since our last visit reviewed. Allergies and medications reviewed and updated.  Review of Systems  Constitutional: Positive for fatigue. Negative for activity change, appetite change, diaphoresis and fever.  Respiratory: Negative for cough, chest tightness and shortness of breath.   Cardiovascular: Negative for chest pain, palpitations and leg swelling.  Gastrointestinal: Negative.   Neurological: Negative.     Psychiatric/Behavioral: Negative for decreased concentration, self-injury, sleep disturbance and suicidal ideas.    Per HPI unless specifically indicated above     Objective:    There were no vitals taken for this visit.  Wt Readings from Last 3 Encounters:  07/20/19 117 lb (53.1 kg)  03/31/19 119 lb (54 kg)  08/20/18 113 lb 9.6 oz (51.5 kg)    Physical Exam Vitals and nursing note reviewed.  Constitutional:      General: She is awake. She is not in acute distress.    Appearance: She is well-developed. She is not ill-appearing.  HENT:     Head: Normocephalic.     Right Ear: Hearing normal.     Left Ear: Hearing normal.  Eyes:     General: Lids are normal.        Right eye: No discharge.        Left eye: No discharge.     Conjunctiva/sclera: Conjunctivae normal.  Pulmonary:     Effort: Pulmonary effort is normal. No accessory muscle usage or respiratory distress.  Musculoskeletal:     Cervical back: Normal range of motion.  Neurological:     Mental Status: She is alert and oriented to person, place, and time.  Psychiatric:        Attention and Perception: Attention normal.        Mood and Affect: Mood normal.        Behavior: Behavior normal. Behavior is cooperative.        Thought Content: Thought content normal.        Judgment: Judgment normal.     Results for orders placed or performed in visit on 09/08/19  TSH  Result Value Ref Range   TSH 5.880 (H) 0.450 - 4.500 uIU/mL  Anemia panel  Result Value Ref Range   Total Iron Binding Capacity 254 250 - 450 ug/dL   UIBC 146 118 - 369 ug/dL   Iron 108 27 - 139 ug/dL   Iron Saturation 43 15 - 55 %   Vitamin B-12 698 232 - 1,245 pg/mL   Folate, Hemolysate 463.0 Not Estab. ng/mL   Hematocrit 37.3 34.0 - 46.6 %   Folate, RBC 1,241 >498 ng/mL   Ferritin 116 15 - 150 ng/mL   Retic Ct Pct 1.2 0.6 - 2.6 %  CBC with Differential/Platelet  Result Value Ref Range   WBC 4.5 3.4 - 10.8 x10E3/uL   RBC 3.83 3.77 - 5.28  x10E6/uL   Hemoglobin 12.5 11.1 - 15.9 g/dL   MCV 97 79 - 97 fL   MCH 32.6 26.6 - 33.0 pg   MCHC 33.5 31.5 - 35.7 g/dL   RDW 11.6 (L) 11.7 - 15.4 %   Platelets 336 150 - 450 x10E3/uL   Neutrophils 65 Not Estab. %   Lymphs 22 Not Estab. %   Monocytes 10 Not Estab. %   Eos 2 Not Estab. %   Basos 1 Not Estab. %   Neutrophils Absolute 2.9 1.4 - 7.0 x10E3/uL  Lymphocytes Absolute 1.0 0.7 - 3.1 x10E3/uL   Monocytes Absolute 0.4 0.1 - 0.9 x10E3/uL   EOS (ABSOLUTE) 0.1 0.0 - 0.4 x10E3/uL   Basophils Absolute 0.1 0.0 - 0.2 x10E3/uL   Immature Granulocytes 0 Not Estab. %   Immature Grans (Abs) 0.0 0.0 - 0.1 x10E3/uL      Assessment & Plan:   Problem List Items Addressed This Visit      Other   Chronic fatigue    Chronic, ongoing with no anemia noted on past labs.  Low NA+ at baseline and on recent labs elevation in TSH noted.  Will recheck this outpatient and start treatment if indicated.  Return in 3 months or sooner if Levothyroxine initiated.      Depression, major, single episode, mild (HCC) - Primary    Chronic, stable with current Effexor dose + PRN Vistaril.  Continue current medication regimen and adjust as needed.  Denies SI/HI.   Return in 3 months.      Relevant Medications   venlafaxine (EFFEXOR) 25 MG tablet   Elevated TSH    Noted on past labs, has ongoing fatigue.  Will recheck this outpatient, along with T4 level.  If continued elevations noted will initiate medication and have patient return in 6 weeks.        Relevant Orders   Thyroid Panel With TSH   Hyponatremia    Chronic since 2016-2017, at time she was heavily drinking alcohol.  Has now been in remission for some time and continues with lower NA+ levels.  Will recheck outpatient.  Continue daily supplement.  Monitor closely and avoid medications which may exacerbate this.        Relevant Orders   Basic Metabolic Panel (BMET)      I discussed the assessment and treatment plan with the patient. The  patient was provided an opportunity to ask questions and all were answered. The patient agreed with the plan and demonstrated an understanding of the instructions.   The patient was advised to call back or seek an in-person evaluation if the symptoms worsen or if the condition fails to improve as anticipated.   I provided 15 minutes of time during this encounter.  Follow up plan: Return in about 3 months (around 03/14/2020) for Mood and Fatigue.

## 2019-12-16 NOTE — Patient Instructions (Signed)
Thyroid-Stimulating Hormone Test Why am I having this test? You may have a thyroid-stimulating hormone (TSH) test if you have possible symptoms of abnormal thyroid hormone levels. This test can help your health care provider:  Diagnose a disorder of the thyroid gland or pituitary gland.  Manage your condition and treatment if you have an underactive thyroid (hypothyroidism) or an overactive thyroid (hyperthyroidism). Newborn babies may have this test done to screen for hypothyroidism that is present at birth (congenital). The thyroid is a gland in the lower front of the neck. It makes hormones that affect many body parts and systems, including the system that affects how quickly the body burns fuel for energy (metabolism). The pituitary gland is located just below the brain, behind the eyes and nasal passages. It helps maintain thyroid hormone levels and thyroid gland function. What is being tested? This test measures the amount of TSH in your blood. TSH may also be called thyrotropin. When the thyroid does not make enough hormones, the pituitary gland releases TSH into the bloodstream to stimulate the thyroid gland to make more hormones. What kind of sample is taken?     A blood sample is required for this test. It is usually collected by inserting a needle into a blood vessel. For newborns, a small amount of blood may be collected from the umbilical cord, or by using a small needle to prick the baby's heel (heel stick). Tell a health care provider about:  All medicines you are taking, including vitamins, herbs, eye drops, creams, and over-the-counter medicines.  Any blood disorders you have.  Any surgeries you have had.  Any medical conditions you have.  Whether you are pregnant or may be pregnant. How are the results reported? Your test results will be reported as a value that indicates how much TSH is in your blood. Your health care provider will compare your results to normal ranges  that were established after testing a large group of people (reference ranges). Reference ranges may vary among labs and hospitals. For this test, common reference ranges are:  Adult: 2-10 microunits/mL or 2-10 milliunits/L.  Newborn: ? Heel stick: 3-18 microunits/mL or 3-18 milliunits/L. ? Umbilical cord: 3-12 microunits/mL or 3-12 milliunits/L. What do the results mean? Results that are within the reference range are considered normal. This means that you have a normal amount of TSH in your blood. Results that are higher than the reference range mean that your TSH levels are too high. This may mean:  Your thyroid gland is not making enough thyroid hormones.  Your thyroid medicine dosage is too low.  You have a tumor on your pituitary gland. This is rare. Results that are lower than the reference range mean that your TSH levels are too low. This may be caused by hyperthyroidism or by a problem with the pituitary gland function. Talk with your health care provider about what your results mean. Questions to ask your health care provider Ask your health care provider, or the department that is doing the test:  When will my results be ready?  How will I get my results?  What are my treatment options?  What other tests do I need?  What are my next steps? Summary  You may have a thyroid-stimulating hormone (TSH) test if you have possible symptoms of abnormal thyroid hormone levels.  The thyroid is a gland in the lower front of the neck. It makes hormones that affect many body parts and systems.  The pituitary gland is located   just below the brain, behind the eyes and nasal passages. It helps maintain thyroid hormone levels and thyroid gland function.  This test measures the amount of TSH in your blood. TSH is made by the pituitary gland. It may also be called thyrotropin. This information is not intended to replace advice given to you by your health care provider. Make sure you  discuss any questions you have with your health care provider. Document Revised: 01/26/2018 Document Reviewed: 07/01/2017 Elsevier Patient Education  2020 [employer]  

## 2019-12-16 NOTE — Assessment & Plan Note (Signed)
Chronic, stable with current Effexor dose + PRN Vistaril.  Continue current medication regimen and adjust as needed.  Denies SI/HI.   Return in 3 months.

## 2019-12-22 ENCOUNTER — Other Ambulatory Visit: Payer: Self-pay

## 2019-12-22 ENCOUNTER — Other Ambulatory Visit: Payer: BC Managed Care – PPO

## 2019-12-22 DIAGNOSIS — E871 Hypo-osmolality and hyponatremia: Secondary | ICD-10-CM

## 2019-12-22 DIAGNOSIS — R7989 Other specified abnormal findings of blood chemistry: Secondary | ICD-10-CM

## 2019-12-22 DIAGNOSIS — R5383 Other fatigue: Secondary | ICD-10-CM | POA: Diagnosis not present

## 2019-12-23 LAB — BASIC METABOLIC PANEL
BUN/Creatinine Ratio: 14 (ref 12–28)
BUN: 11 mg/dL (ref 8–27)
CO2: 21 mmol/L (ref 20–29)
Calcium: 9.4 mg/dL (ref 8.7–10.3)
Chloride: 97 mmol/L (ref 96–106)
Creatinine, Ser: 0.78 mg/dL (ref 0.57–1.00)
GFR calc Af Amer: 93 mL/min/{1.73_m2} (ref >59)
GFR calc non Af Amer: 81 mL/min/{1.73_m2} (ref >59)
Glucose: 85 mg/dL (ref 65–99)
Potassium: 5.6 mmol/L — ABNORMAL HIGH (ref 3.5–5.2)
Sodium: 134 mmol/L (ref 134–144)

## 2019-12-23 LAB — THYROID PANEL WITH TSH
Free Thyroxine Index: 1.7 (ref 1.2–4.9)
T3 Uptake Ratio: 27 % (ref 24–39)
T4, Total: 6.3 ug/dL (ref 4.5–12.0)
TSH: 3.48 u[IU]/mL (ref 0.450–4.500)

## 2019-12-23 NOTE — Progress Notes (Signed)
Contacted via MyChart

## 2020-01-31 ENCOUNTER — Encounter: Payer: Self-pay | Admitting: Nurse Practitioner

## 2020-02-02 ENCOUNTER — Ambulatory Visit (INDEPENDENT_AMBULATORY_CARE_PROVIDER_SITE_OTHER): Payer: BC Managed Care – PPO | Admitting: Nurse Practitioner

## 2020-02-02 ENCOUNTER — Encounter: Payer: Self-pay | Admitting: Nurse Practitioner

## 2020-02-02 ENCOUNTER — Ambulatory Visit
Admission: RE | Admit: 2020-02-02 | Discharge: 2020-02-02 | Disposition: A | Discharge status: Home / Self Care | Payer: BC Managed Care – PPO | Source: Ambulatory Visit | Attending: Nurse Practitioner | Admitting: Nurse Practitioner

## 2020-02-02 ENCOUNTER — Ambulatory Visit
Admission: RE | Admit: 2020-02-02 | Discharge: 2020-02-02 | Disposition: A | Discharge status: Home / Self Care | Payer: BC Managed Care – PPO | Attending: Nurse Practitioner | Admitting: Nurse Practitioner

## 2020-02-02 ENCOUNTER — Other Ambulatory Visit: Payer: Self-pay

## 2020-02-02 VITALS — BP 131/84 | HR 79 | Temp 97.6°F

## 2020-02-02 DIAGNOSIS — R059 Cough, unspecified: Secondary | ICD-10-CM

## 2020-02-02 DIAGNOSIS — E039 Hypothyroidism, unspecified: Secondary | ICD-10-CM | POA: Diagnosis not present

## 2020-02-02 DIAGNOSIS — D519 Vitamin B12 deficiency anemia, unspecified: Secondary | ICD-10-CM | POA: Diagnosis not present

## 2020-02-02 DIAGNOSIS — R05 Cough: Secondary | ICD-10-CM

## 2020-02-02 DIAGNOSIS — R5382 Chronic fatigue, unspecified: Secondary | ICD-10-CM

## 2020-02-02 DIAGNOSIS — K429 Umbilical hernia without obstruction or gangrene: Secondary | ICD-10-CM

## 2020-02-02 DIAGNOSIS — R200 Anesthesia of skin: Secondary | ICD-10-CM | POA: Diagnosis not present

## 2020-02-02 DIAGNOSIS — R519 Headache, unspecified: Secondary | ICD-10-CM | POA: Diagnosis not present

## 2020-02-02 DIAGNOSIS — E559 Vitamin D deficiency, unspecified: Secondary | ICD-10-CM | POA: Diagnosis not present

## 2020-02-02 DIAGNOSIS — Z136 Encounter for screening for cardiovascular disorders: Secondary | ICD-10-CM | POA: Diagnosis not present

## 2020-02-02 DIAGNOSIS — R202 Paresthesia of skin: Secondary | ICD-10-CM

## 2020-02-02 HISTORY — DX: Headache, unspecified: R51.9

## 2020-02-02 MED ORDER — AMITRIPTYLINE HCL 10 MG PO TABS: 10.0000 mg | ORAL_TABLET | Freq: Every day | ORAL | 1 refills | Status: DC

## 2020-02-02 NOTE — Patient Instructions (Signed)

## 2020-02-02 NOTE — Assessment & Plan Note (Signed)
New onset over past months, frontal with nausea intermittent.  Related to chronic fatigue? Will obtain labs today to include CBC, CMP, ESR, CRP, TSH.  Is taking Sertraline at this time for mood and had recent loss of grandchild.  Will trial low dose Amitriptyline at night and assess if benefit to headaches.  Continue use of Tylenol or Ibuprofen as needed at home.  Consider addition of Zofran if ongoing nausea with headaches.

## 2020-02-02 NOTE — Progress Notes (Signed)
Contacted via MyChart

## 2020-02-02 NOTE — Assessment & Plan Note (Signed)
New onset, intermittent in hands and feet over past months.  Physical exam WNL with normal sensation.  Obtain labs today to further assess including CBC, CMP, TSH, ESR, and CRP.  Consider referral to neuro if ongoing symptoms, question if related to her chronic fatigue.

## 2020-02-02 NOTE — Progress Notes (Signed)
BP 131/84 (BP Location: Left Arm, Patient Position: Sitting, Cuff Size: Normal)   Pulse 79   Temp 97.6 F (36.4 C) (Oral)   SpO2 99%    Subjective:    Patient ID: Patty Cruz, female    DOB: 1955/11/25, 64 y.o.   MRN: [id number]  HPI: Patty Cruz is a 64 y.o. female  Chief Complaint  Patient presents with  . Headache  . Cough  . Fatigue  . Nausea  . Numbness    tingling hands and feet.    FATIGUE Has had ongoing fatigue for months -- labs mainly WNL with exception of mild elevation in K+ recently, fatigue has gotten worse over past months.  Did lose grandchild on October unexpectedly.  Last few weeks had nausea and spacy feeling, no vomiting or fever.  No abdominal pain, constipation, or diarrhea.  Does have occasional headache, intermittent, not debilitating, dull/ache, frontal.  Reports the headache pain fluctuates between a 4-7/10.  Nausea presents with headache.  No history of migraines.  No photophobia or phonophobia.  Takes Ibuprofen, which she is not sure if this helps, often headache will go away after 2-3 hours to 7 hours or more.  Is sleeping well, denies insomnia, and no snoring that she knows of.  Denies weight loss or change in appetite.  Has a cough for a few months, intermittent.  Non productive.  Denies rhinorrhea, sinus pressure, postnasal drip.  Has had numbness and tingling intermittently in hands and feet, she had missed a dose of her Effexor and initially brushed it off to this.  Feels this in hands and feet, intermittent now even without missed doses medication.  No loss of function, weakness, falls, CP, palpitations.  No history of smoking.  Has history of alcohol abuse, but has not used in over 4 years.  No past or current drug use.    Has history of low sodium, which had improved on recent labs.  Last labs did note K+ mild elevation 5.6 and endorses eating bananas frequently.  Has not had colonoscopy, refuses this or Cologuard.  Denies any recent vaginal  bleeding, postmenopausal. Duration:  months Severity: 8/10  Onset: gradual Context when symptoms started:  unknown Symptoms improve with rest: no  Depressive symptoms: occasional Stress/anxiety: yes -- lost a grandchild in October -- 37 1/2 years old Insomnia:none Snoring: no Observed apnea by bed partner: no Daytime hypersomnolence:yes Wakes feeling refreshed: no History of sleep study: no Dysnea on exertion:  yes -- occasional for months -- walking uphill or exerting self Orthopnea/PND: one pillow under head -- no orthopnea Chest pain: feels tight at times Chronic cough: intermittent over past months Lower extremity edema: no Arthralgias:no Myalgias: no Weakness: no Rash: no  Relevant past medical, surgical, family and social history reviewed and updated as indicated. Interim medical history since our last visit reviewed. Allergies and medications reviewed and updated.  Review of Systems  Constitutional: Positive for fatigue. Negative for activity change, appetite change, chills, fever and unexpected weight change.  HENT: Negative for congestion, facial swelling, postnasal drip, rhinorrhea, sinus pressure, sinus pain, sore throat and voice change.   Respiratory: Positive for cough and shortness of breath (occasionally with uphill walking or major exertion). Negative for apnea, chest tightness and wheezing.   Cardiovascular: Negative for chest pain, palpitations and leg swelling.  Gastrointestinal: Positive for nausea. Negative for abdominal distention, abdominal pain, constipation, diarrhea, rectal pain and vomiting.  Endocrine: Negative for cold intolerance, heat intolerance, polydipsia, polyphagia and polyuria.  Neurological: Positive for headaches. Negative for dizziness, tremors, seizures, speech difficulty, weakness, light-headedness and numbness.  Psychiatric/Behavioral: Negative for decreased concentration, self-injury, sleep disturbance and suicidal ideas. The patient is  not nervous/anxious.     Per HPI unless specifically indicated above     Objective:    BP 131/84 (BP Location: Left Arm, Patient Position: Sitting, Cuff Size: Normal)   Pulse 79   Temp 97.6 F (36.4 C) (Oral)   SpO2 99%   Wt Readings from Last 3 Encounters:  07/20/19 117 lb (53.1 kg)  03/31/19 119 lb (54 kg)  08/20/18 113 lb 9.6 oz (51.5 kg)    Physical Exam Vitals and nursing note reviewed.  Constitutional:      General: She is awake. She is not in acute distress.    Appearance: She is well-developed and well-groomed. She is not ill-appearing.  HENT:     Head: Normocephalic.     Right Ear: Hearing, tympanic membrane, ear canal and external ear normal.     Left Ear: Hearing, tympanic membrane, ear canal and external ear normal.     Nose: Nose normal.     Mouth/Throat:     Mouth: Mucous membranes are moist.     Pharynx: Oropharynx is clear.  Eyes:     General: Lids are normal.        Right eye: No discharge.        Left eye: No discharge.     Extraocular Movements: Extraocular movements intact.     Conjunctiva/sclera: Conjunctivae normal.     Pupils: Pupils are equal, round, and reactive to light.     Visual Fields: Right eye visual fields normal and left eye visual fields normal.  Neck:     Thyroid: No thyromegaly.     Vascular: No carotid bruit.  Cardiovascular:     Rate and Rhythm: Normal rate and regular rhythm.     Heart sounds: Normal heart sounds. No murmur. No gallop.   Pulmonary:     Effort: Pulmonary effort is normal. No accessory muscle usage or respiratory distress.     Breath sounds: Normal breath sounds.  Abdominal:     General: Bowel sounds are normal. There is no distension or abdominal bruit.     Palpations: Abdomen is soft. There is no hepatomegaly or splenomegaly.     Tenderness: There is no abdominal tenderness.     Hernia: A hernia is present. Hernia is present in the umbilical area (small umbilical hernia noted).  Musculoskeletal:      Cervical back: Normal range of motion and neck supple.     Right lower leg: No edema.     Left lower leg: No edema.  Lymphadenopathy:     Head:     Right side of head: No submental, submandibular, tonsillar, preauricular or posterior auricular adenopathy.     Left side of head: No submental, submandibular, tonsillar, preauricular or posterior auricular adenopathy.     Cervical: No cervical adenopathy.  Skin:    General: Skin is warm and dry.  Neurological:     Mental Status: She is alert and oriented to person, place, and time.     Cranial Nerves: Cranial nerves are intact.     Sensory: Sensation is intact.     Motor: Motor function is intact.     Coordination: Coordination is intact.     Gait: Gait is intact.     Deep Tendon Reflexes: Reflexes are normal and symmetric.     Reflex Scores:  Brachioradialis reflexes are 2+ on the right side and 2+ on the left side.      Patellar reflexes are 2+ on the right side and 2+ on the left side. Psychiatric:        Attention and Perception: Attention normal.        Mood and Affect: Mood normal.        Speech: Speech normal.        Behavior: Behavior normal. Behavior is cooperative.        Thought Content: Thought content normal.     Results for orders placed or performed in visit on 42/68/34  Basic Metabolic Panel (BMET)  Result Value Ref Range   Glucose 85 65 - 99 mg/dL   BUN 11 8 - 27 mg/dL   Creatinine, Ser 0.78 0.57 - 1.00 mg/dL   GFR calc non Af Amer 81 >59 mL/min/1.73   GFR calc Af Amer 93 >59 mL/min/1.73   BUN/Creatinine Ratio 14 12 - 28   Sodium 134 134 - 144 mmol/L   Potassium 5.6 (H) 3.5 - 5.2 mmol/L   Chloride 97 96 - 106 mmol/L   CO2 21 20 - 29 mmol/L   Calcium 9.4 8.7 - 10.3 mg/dL  Thyroid Panel With TSH  Result Value Ref Range   TSH 3.480 0.450 - 4.500 uIU/mL   T4, Total 6.3 4.5 - 12.0 ug/dL   T3 Uptake Ratio 27 24 - 39 %   Free Thyroxine Index 1.7 1.2 - 4.9      Assessment & Plan:   Problem List Items  Addressed This Visit      Other   Chronic fatigue - Primary    Ongoing, all current labs basically WNL with exception of mild elevation in K+.  Currently new symptoms present including cough, headaches with nausea, and numbness.  Unknown whether related to fatigue or separate.  Will obtain labs today to include ESR, CRP, TSH, CBC, CMP, Covid antibody, Lipid, iron, Vit D, and HIV + Hep C screening.  Obtain CXR due to cough and echo to assess cardiac function with her intermittent SOB with exertion.  Worsening fatigue and symptoms possibly related to recent stressor of losing grandchild? Will due full work-up to further assess.  Physical exam WNL.      Relevant Orders   Iron, TIBC and Ferritin Panel   C-reactive protein   Sed Rate (ESR)   Thyroid Panel With TSH   SAR CoV2 Serology (COVID 19)AB(IGG)IA   B12   Comprehensive metabolic panel   CBC with Differential/Platelet   VITAMIN D 25 Hydroxy (Vit-D Deficiency, Fractures)   Hepatitis C antibody   HIV Antibody (routine testing w rflx)   Lipid Panel w/o Chol/HDL Ratio   ECHOCARDIOGRAM COMPLETE   Cough    New onset with presence of chronic fatigue.  Non smoker.  Obtain labs today to include CBC, CMP, and TSH.  Obtain CXR to evaluate further.  Possible allergic rhinitis or GERD with history of heavy alcohol use, will review further after labs resulted.  Recommend trial of daily Claritin or Allegra.      Relevant Orders   DG Chest 2 View (Completed)   ECHOCARDIOGRAM COMPLETE   Frontal headache    New onset over past months, frontal with nausea intermittent.  Related to chronic fatigue? Will obtain labs today to include CBC, CMP, ESR, CRP, TSH.  Is taking Sertraline at this time for mood and had recent loss of grandchild.  Will trial low dose Amitriptyline at night  and assess if benefit to headaches.  Continue use of Tylenol or Ibuprofen as needed at home.  Consider addition of Zofran if ongoing nausea with headaches.      Relevant  Medications   amitriptyline (ELAVIL) 10 MG tablet   Numbness and tingling    New onset, intermittent in hands and feet over past months.  Physical exam WNL with normal sensation.  Obtain labs today to further assess including CBC, CMP, TSH, ESR, and CRP.  Consider referral to neuro if ongoing symptoms, question if related to her chronic fatigue.      Umbilical hernia    Small at 3 o'clock position.  No current symptoms or discomfort.  Continue to monitor and refer to general surgery as needed.         Time: 25 minutes, >50% spent counseling and educating on work-up for fatigue and current symptoms   Follow up plan: Return in about 1 week (around 02/09/2020) for Fatigue.

## 2020-02-02 NOTE — Assessment & Plan Note (Signed)
Small at 3 o'clock position.  No current symptoms or discomfort.  Continue to monitor and refer to general surgery as needed.

## 2020-02-02 NOTE — Assessment & Plan Note (Signed)
New onset with presence of chronic fatigue.  Non smoker.  Obtain labs today to include CBC, CMP, and TSH.  Obtain CXR to evaluate further.  Possible allergic rhinitis or GERD with history of heavy alcohol use, will review further after labs resulted.  Recommend trial of daily Claritin or Allegra.

## 2020-02-02 NOTE — Assessment & Plan Note (Signed)
Ongoing, all current labs basically WNL with exception of mild elevation in K+.  Currently new symptoms present including cough, headaches with nausea, and numbness.  Unknown whether related to fatigue or separate.  Will obtain labs today to include ESR, CRP, TSH, CBC, CMP, Covid antibody, Lipid, iron, Vit D, and HIV + Hep C screening.  Obtain CXR due to cough and echo to assess cardiac function with her intermittent SOB with exertion.  Worsening fatigue and symptoms possibly related to recent stressor of losing grandchild? Will due full work-up to further assess.  Physical exam WNL.

## 2020-02-03 LAB — CBC WITH DIFFERENTIAL/PLATELET
Basophils Absolute: 0.1 10*3/uL (ref 0.0–0.2)
Basos: 1 %
EOS (ABSOLUTE): 0.1 10*3/uL (ref 0.0–0.4)
Eos: 2 %
Hematocrit: 40.3 % (ref 34.0–46.6)
Hemoglobin: 13.4 g/dL (ref 11.1–15.9)
Immature Grans (Abs): 0 10*3/uL (ref 0.0–0.1)
Immature Granulocytes: 0 %
Lymphocytes Absolute: 1.2 10*3/uL (ref 0.7–3.1)
Lymphs: 23 %
MCH: 31.8 pg (ref 26.6–33.0)
MCHC: 33.3 g/dL (ref 31.5–35.7)
MCV: 96 fL (ref 79–97)
Monocytes Absolute: 0.5 10*3/uL (ref 0.1–0.9)
Monocytes: 10 %
Neutrophils Absolute: 3.3 10*3/uL (ref 1.4–7.0)
Neutrophils: 64 %
Platelets: 346 10*3/uL (ref 150–450)
RBC: 4.21 x10E6/uL (ref 3.77–5.28)
RDW: 11.5 % — ABNORMAL LOW (ref 11.7–15.4)
WBC: 5.2 10*3/uL (ref 3.4–10.8)

## 2020-02-03 LAB — IRON,TIBC AND FERRITIN PANEL
Ferritin: 102 ng/mL (ref 15–150)
Iron Saturation: 33 % (ref 15–55)
Iron: 113 ug/dL (ref 27–139)
Total Iron Binding Capacity: 343 ug/dL (ref 250–450)
UIBC: 230 ug/dL (ref 118–369)

## 2020-02-03 LAB — COMPREHENSIVE METABOLIC PANEL
ALT: 21 IU/L (ref 0–32)
AST: 35 IU/L (ref 0–40)
Albumin/Globulin Ratio: 1.8 (ref 1.2–2.2)
Albumin: 5.1 g/dL — ABNORMAL HIGH (ref 3.8–4.8)
Alkaline Phosphatase: 133 IU/L — ABNORMAL HIGH (ref 39–117)
BUN/Creatinine Ratio: 15 (ref 12–28)
BUN: 11 mg/dL (ref 8–27)
Bilirubin Total: 0.3 mg/dL (ref 0.0–1.2)
CO2: 26 mmol/L (ref 20–29)
Calcium: 9.7 mg/dL (ref 8.7–10.3)
Chloride: 95 mmol/L — ABNORMAL LOW (ref 96–106)
Creatinine, Ser: 0.75 mg/dL (ref 0.57–1.00)
GFR calc Af Amer: 97 mL/min/{1.73_m2} (ref >59)
GFR calc non Af Amer: 85 mL/min/{1.73_m2} (ref >59)
Globulin, Total: 2.9 g/dL (ref 1.5–4.5)
Glucose: 85 mg/dL (ref 65–99)
Potassium: 4.8 mmol/L (ref 3.5–5.2)
Sodium: 135 mmol/L (ref 134–144)
Total Protein: 8 g/dL (ref 6.0–8.5)

## 2020-02-03 LAB — HIV ANTIBODY (ROUTINE TESTING W REFLEX): HIV Screen 4th Generation wRfx: NONREACTIVE

## 2020-02-03 LAB — SEDIMENTATION RATE: Sed Rate: 2 mm/hr (ref 0–40)

## 2020-02-03 LAB — LIPID PANEL W/O CHOL/HDL RATIO
Cholesterol, Total: 232 mg/dL — ABNORMAL HIGH (ref 100–199)
HDL: 93 mg/dL (ref >39)
LDL Chol Calc (NIH): 126 mg/dL — ABNORMAL HIGH (ref 0–99)
Triglycerides: 76 mg/dL (ref 0–149)
VLDL Cholesterol Cal: 13 mg/dL (ref 5–40)

## 2020-02-03 LAB — THYROID PANEL WITH TSH
Free Thyroxine Index: 1.7 (ref 1.2–4.9)
T3 Uptake Ratio: 25 % (ref 24–39)
T4, Total: 6.7 ug/dL (ref 4.5–12.0)
TSH: 3.65 u[IU]/mL (ref 0.450–4.500)

## 2020-02-03 LAB — C-REACTIVE PROTEIN: CRP: <1 mg/L (ref 0–10)

## 2020-02-03 LAB — VITAMIN B12: Vitamin B-12: 1706 pg/mL — ABNORMAL HIGH (ref 232–1245)

## 2020-02-03 LAB — HEPATITIS C ANTIBODY: Hep C Virus Ab: <0.1 s/co ratio (ref 0.0–0.9)

## 2020-02-03 LAB — SAR COV2 SEROLOGY (COVID19)AB(IGG),IA: DiaSorin SARS-CoV-2 Ab, IgG: NEGATIVE

## 2020-02-03 LAB — VITAMIN D 25 HYDROXY (VIT D DEFICIENCY, FRACTURES): Vit D, 25-Hydroxy: 23.4 ng/mL — ABNORMAL LOW (ref 30.0–100.0)

## 2020-02-03 NOTE — Progress Notes (Signed)
Contacted via MyChart The 10-year ASCVD risk score Patty George DC Jr., et al., 2013) is: 4.5%   Values used to calculate the score:     Age: 64 years     Sex: Female     Is Non-Hispanic African American: No     Diabetic: No     Tobacco smoker: No     Systolic Blood Pressure: 131 mmHg     Is BP treated: No     HDL Cholesterol: 93 mg/dL     Total Cholesterol: 232 mg/dL

## 2020-02-07 ENCOUNTER — Encounter: Payer: Self-pay | Admitting: Nurse Practitioner

## 2020-02-09 ENCOUNTER — Ambulatory Visit: Payer: BC Managed Care – PPO | Admitting: Nurse Practitioner

## 2020-02-10 ENCOUNTER — Ambulatory Visit: Payer: BC Managed Care – PPO | Attending: Internal Medicine

## 2020-02-10 DIAGNOSIS — Z23 Encounter for immunization: Secondary | ICD-10-CM

## 2020-02-10 NOTE — Progress Notes (Signed)
   Covid-19 Vaccination Clinic  Name:  JEYLIN WOODMANSEE    MRN: [id number] DOB: 16-Aug-1956  02/10/2020  Ms. Kleinschmidt was observed post Covid-19 immunization for 15 minutes without incident. She was provided with Vaccine Information Sheet and instruction to access the V-Safe system.   Ms. Mondry was instructed to call 911 with any severe reactions post vaccine: Marland Kitchen Difficulty breathing  . Swelling of face and throat  . A fast heartbeat  . A bad rash all over body  . Dizziness and weakness   Immunizations Administered    Name Date Dose VIS Date Route   Pfizer COVID-19 Vaccine 02/10/2020 10:06 AM 0.3 mL 10/22/2019 Intramuscular   Manufacturer: [employer], [employer]   Lot: [id number]   NDC: [address]-3

## 2020-02-11 ENCOUNTER — Encounter: Payer: Self-pay | Admitting: Nurse Practitioner

## 2020-03-07 ENCOUNTER — Ambulatory Visit: Payer: Self-pay

## 2020-03-15 ENCOUNTER — Ambulatory Visit: Payer: BC Managed Care – PPO | Attending: Internal Medicine

## 2020-03-15 DIAGNOSIS — Z23 Encounter for immunization: Secondary | ICD-10-CM

## 2020-03-15 NOTE — Progress Notes (Signed)
   Covid-19 Vaccination Clinic  Name:  KEYARIA LAWSON    MRN: [id number] DOB: 1956/11/02  03/15/2020  Ms. Fowle was observed post Covid-19 immunization for 15 minutes without incident. She was provided with Vaccine Information Sheet and instruction to access the V-Safe system.   Ms. Caffey was instructed to call 911 with any severe reactions post vaccine: Marland Kitchen Difficulty breathing  . Swelling of face and throat  . A fast heartbeat  . A bad rash all over body  . Dizziness and weakness   Immunizations Administered    Name Date Dose VIS Date Route   Pfizer COVID-19 Vaccine 03/15/2020 11:13 AM 0.3 mL 01/05/2019 Intramuscular   Manufacturer: [employer], [employer]   Lot: [id number]   NDC: [address]-0

## 2020-03-17 ENCOUNTER — Ambulatory Visit: Payer: BC Managed Care – PPO | Admitting: Nurse Practitioner

## 2020-03-19 IMAGING — MG DIGITAL DIAGNOSTIC BILAT W/ TOMO W/ CAD
6 of 10 series · 6 of 30 positions shown · non-contrast
Comparison: None.

CLINICAL DATA: Pain in the lower outer left breast for the past 6-8
months. She does not feel the pain today but is able to localize the
area. She has not felt the pain for several weeks. Maternal
grandmother with a history of breast cancer. The patient's last
mammogram was approximately 20 years ago.

EXAM:
DIGITAL DIAGNOSTIC BILATERAL MAMMOGRAM WITH CAD AND TOMO
ULTRASOUND LEFT BREAST

[L CC synth-2D]
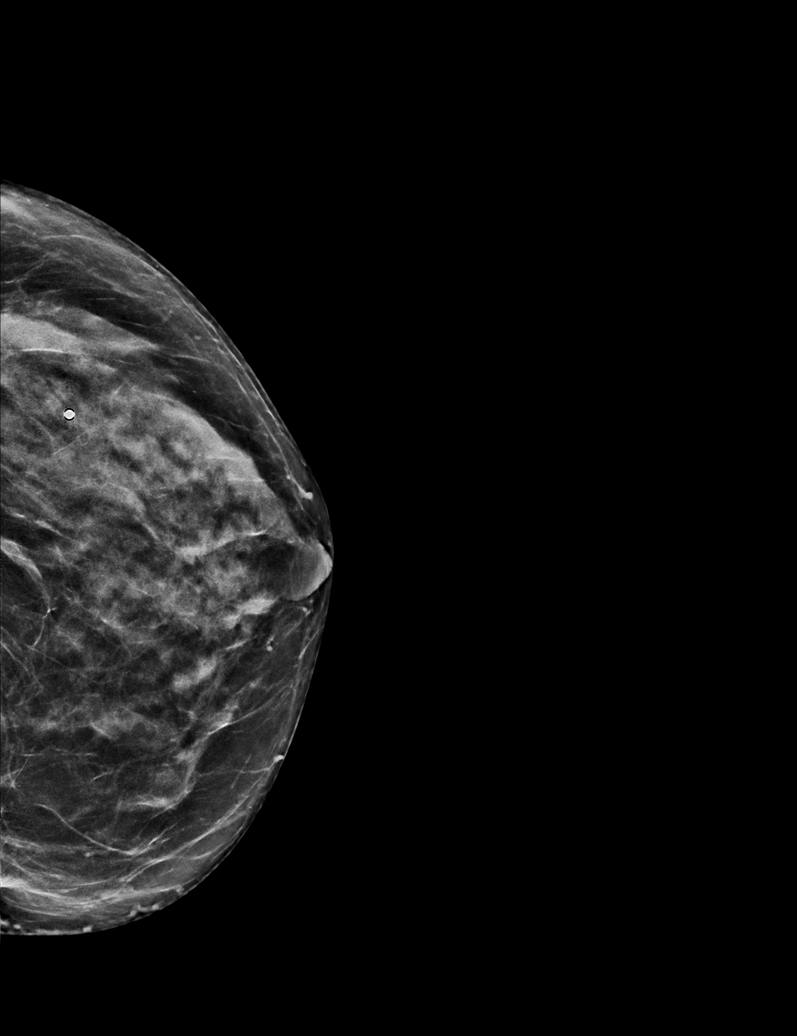

[R MLO synth-2D]
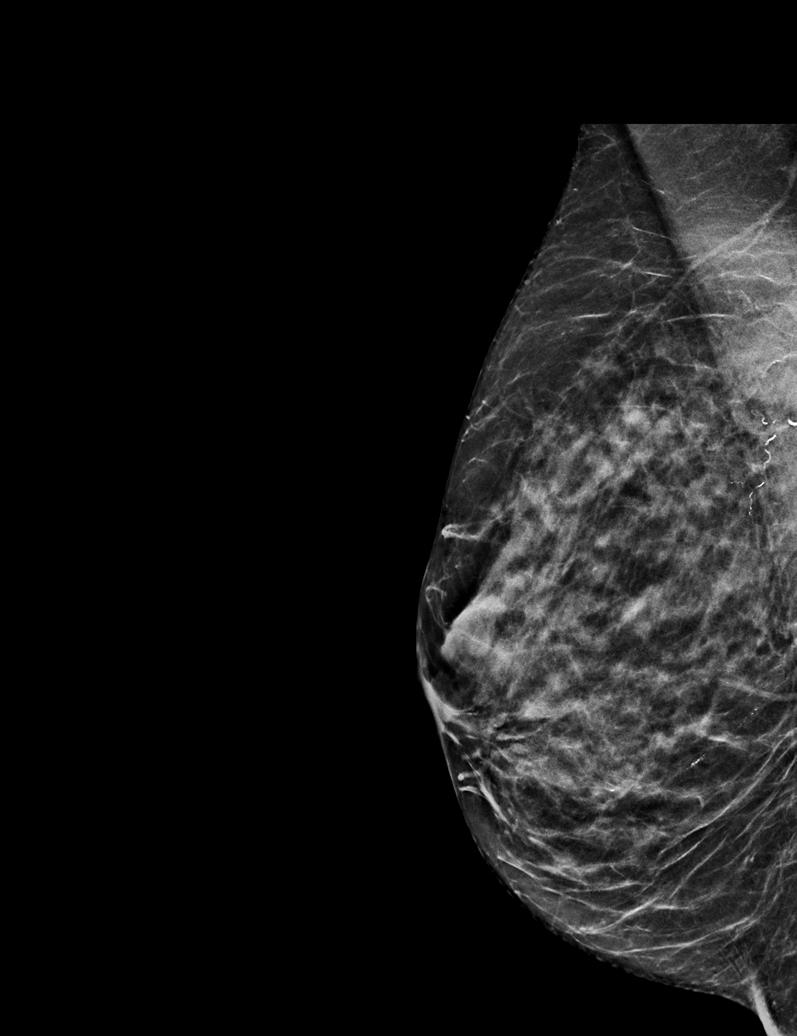

[L TAN synth-2D]
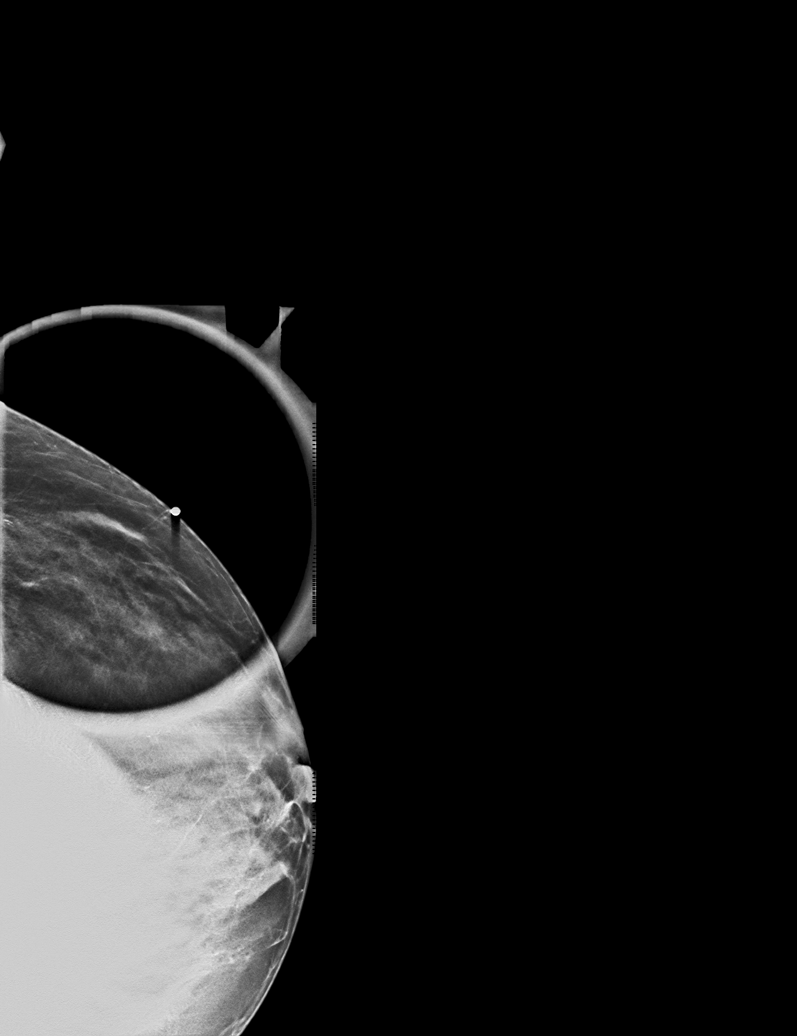

[L MLO synth-2D]
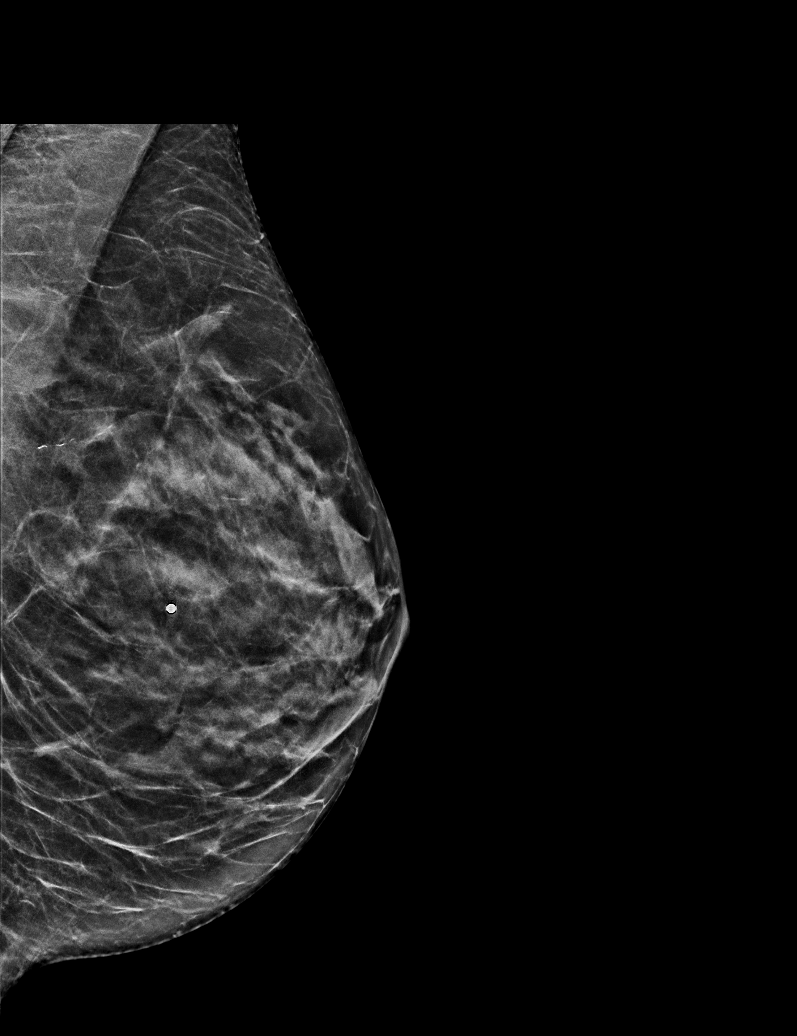

[R CC synth-2D]
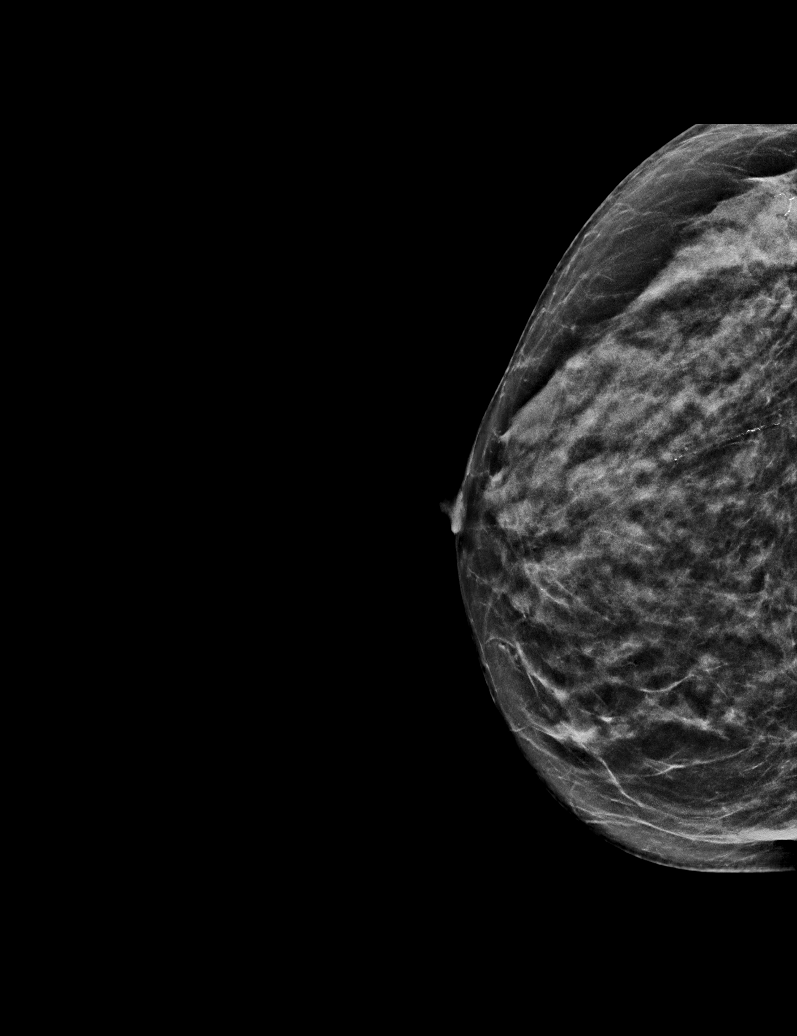

[R MLO tomo · tomo slice 25/48.0]
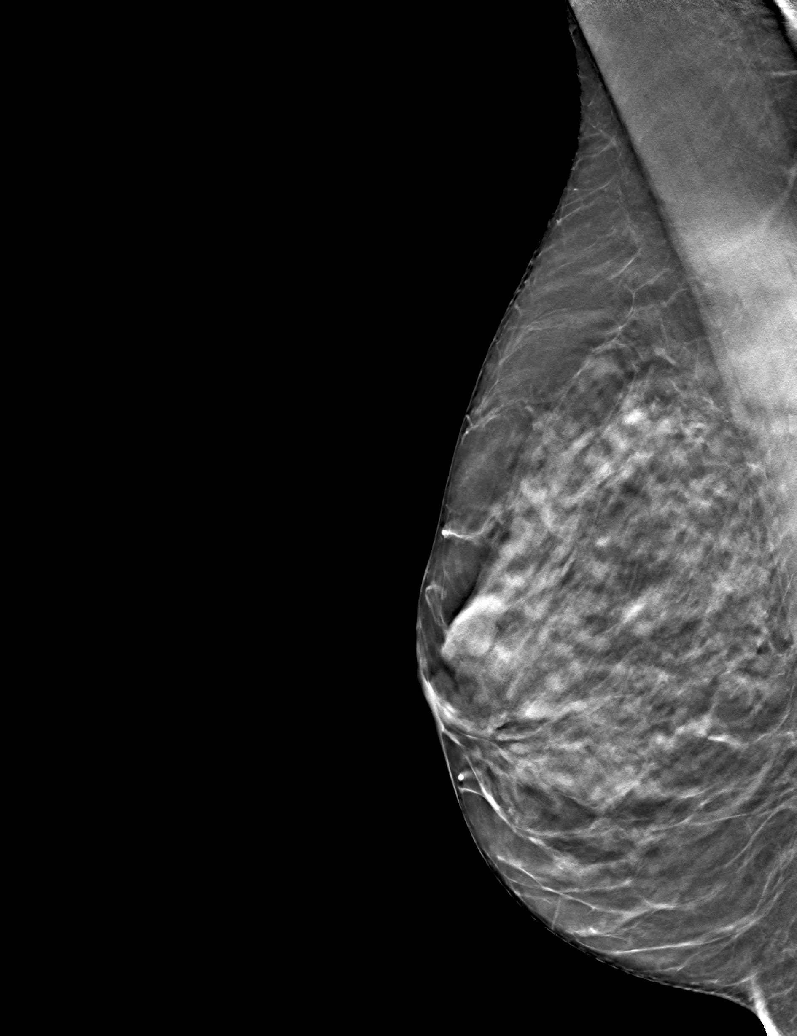

[6 of 30 positions shown; findings below may reference images not displayed]

ACR Breast Density Category c: The breast tissue is heterogeneously
dense, which may obscure small masses.
FINDINGS: No mammographic findings suspicious for malignancy in either breast,
including the area of pain on the left, marked a metallic marker.

Mammographic images were processed with CAD.

On physical exam, there is no palpable mass or other abnormality in
the area of patient concern in the lower outer left breast.

Targeted ultrasound is performed, showing normal appearing breast
tissue in the lower outer left breast in the area of patient
concern. No visible underlying rib or muscle abnormality.
IMPRESSION: No evidence of malignancy.

RECOMMENDATION:
Bilateral screening mammogram in 1 year.

I have discussed the findings and recommendations with the patient.
If applicable, a reminder letter will be sent to the patient
regarding the next appointment.

BI-RADS CATEGORY  1: Negative.

## 2020-07-04 IMAGING — DX DG CHEST 2V
2 series · 2 of 2 positions shown · non-contrast
Comparison: Chest radiograph 11/10/2013

CLINICAL DATA: Ongoing cough for months.

EXAM:
CHEST - 2 VIEW

[chest pa]
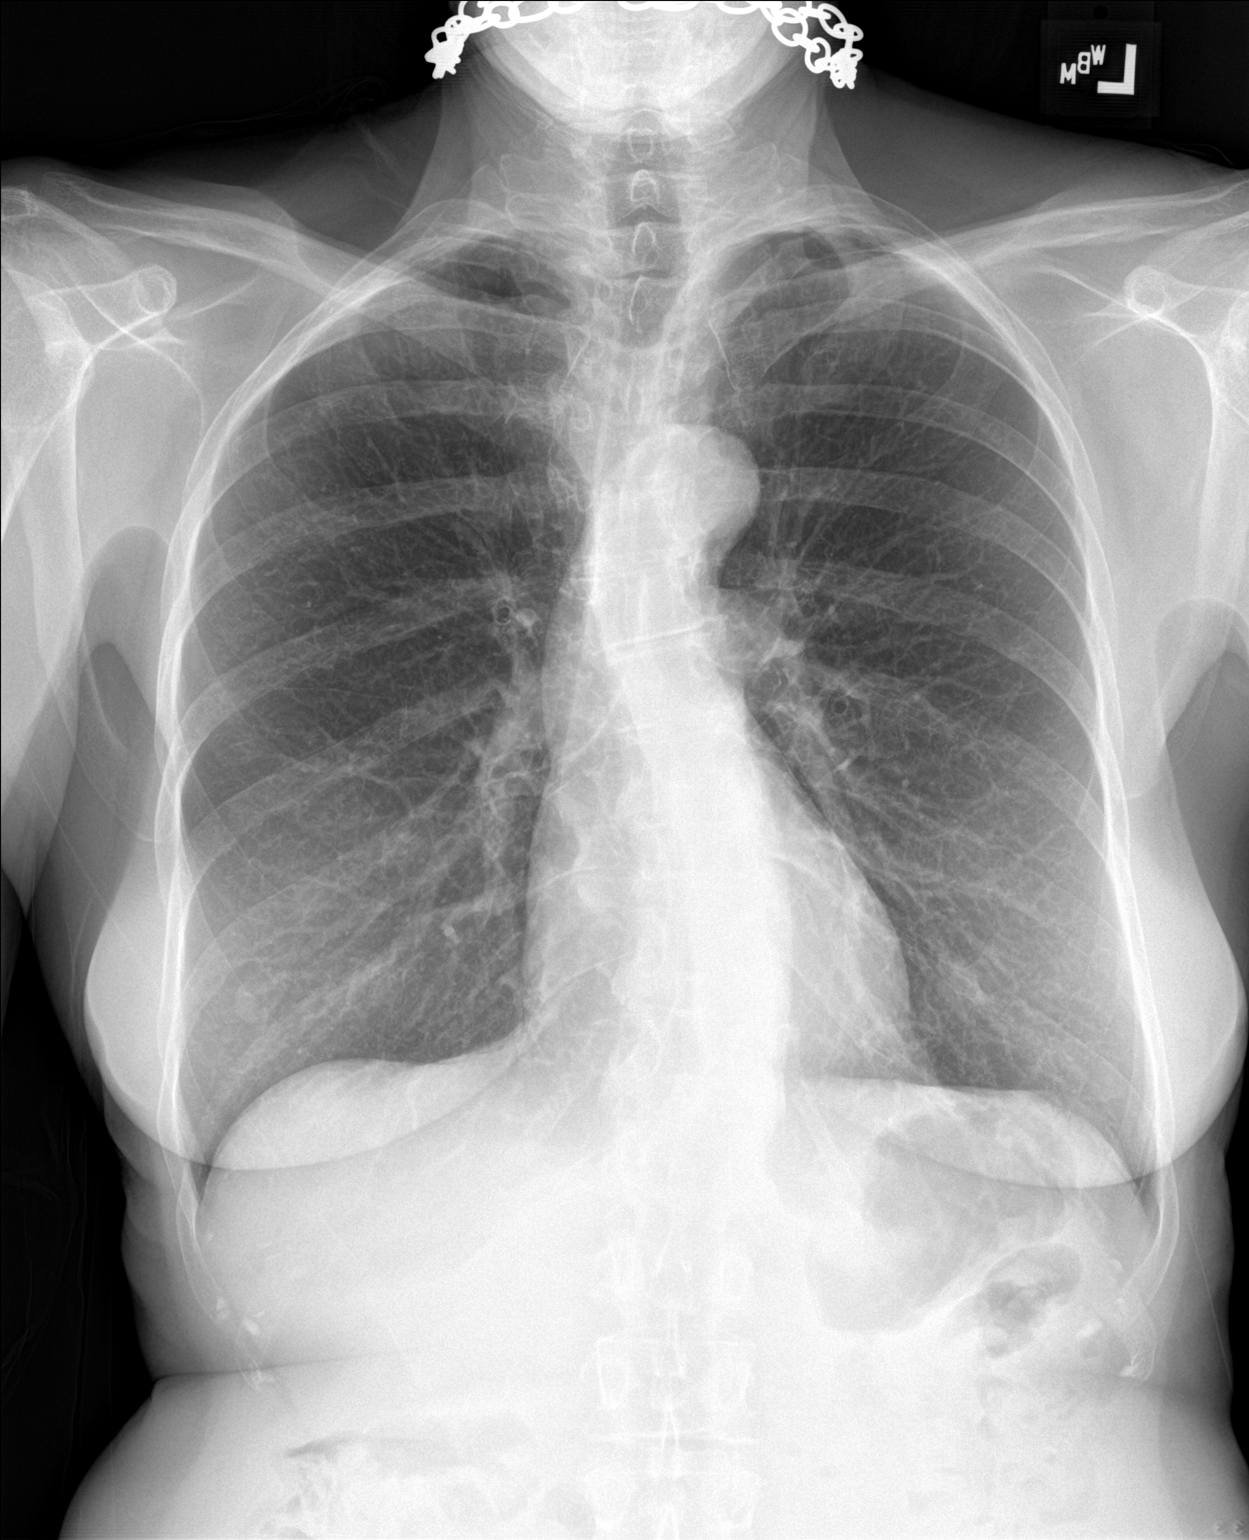

[chest lat]
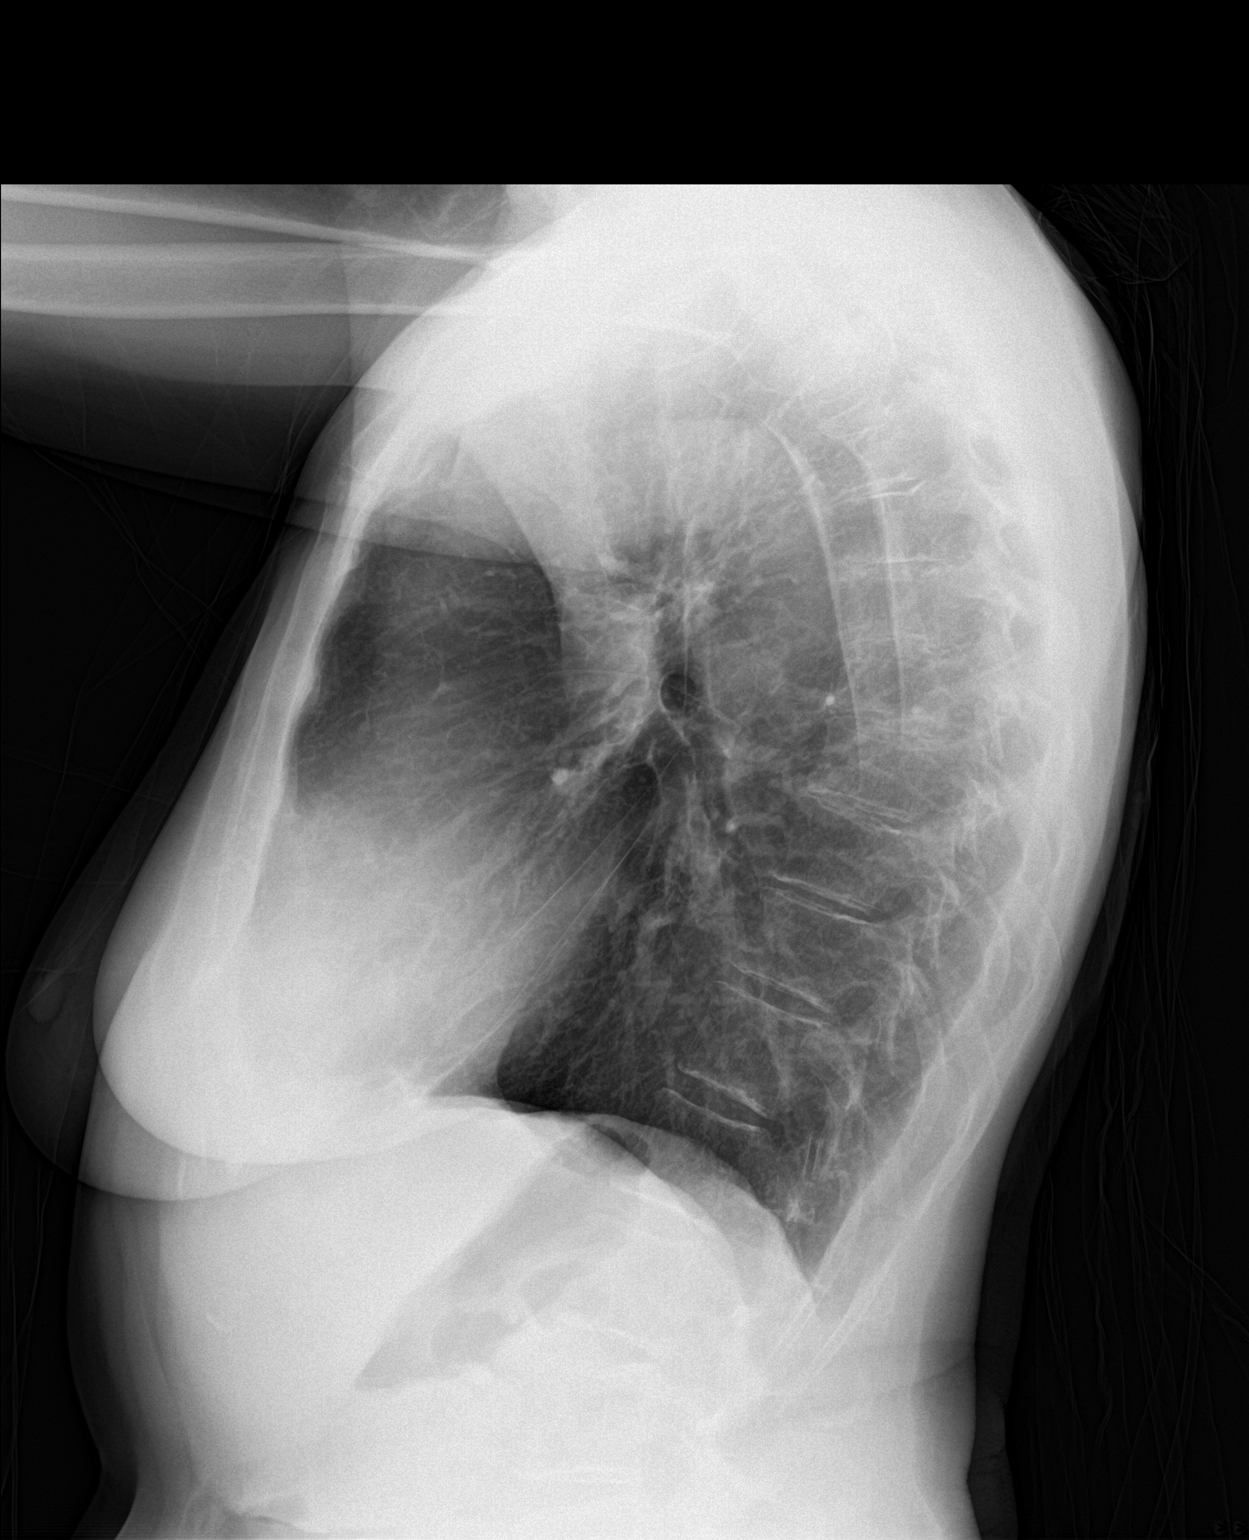

[2 of 2 positions shown; findings below may reference images not displayed]

FINDINGS: The cardiomediastinal contours are normal. The lungs are clear.
Pulmonary vasculature is normal. No consolidation, pleural effusion,
or pneumothorax. No acute osseous abnormalities are seen. Scoliotic
curvature of the midthoracic spine, again seen.
IMPRESSION: No acute chest findings or explanation for cough.

## 2020-07-08 ENCOUNTER — Other Ambulatory Visit: Payer: Self-pay | Admitting: Nurse Practitioner

## 2020-07-10 ENCOUNTER — Other Ambulatory Visit: Payer: Self-pay | Admitting: Nurse Practitioner

## 2020-08-14 NOTE — Telephone Encounter (Signed)
Please Advise. Pt needs appt.  Thank you,  KP

## 2020-08-14 NOTE — Telephone Encounter (Signed)
No appts available this week, please advise.

## 2020-08-16 ENCOUNTER — Encounter: Payer: Self-pay | Admitting: Nurse Practitioner

## 2020-08-16 ENCOUNTER — Telehealth (INDEPENDENT_AMBULATORY_CARE_PROVIDER_SITE_OTHER): Payer: 59 | Admitting: Nurse Practitioner

## 2020-08-16 VITALS — Ht 64.0 in | Wt 117.0 lb

## 2020-08-16 DIAGNOSIS — L03032 Cellulitis of left toe: Secondary | ICD-10-CM | POA: Diagnosis not present

## 2020-08-16 MED ORDER — CEPHALEXIN 500 MG PO CAPS
500.0000 mg | ORAL_CAPSULE | Freq: Four times a day (QID) | ORAL | 0 refills | Status: DC
Start: 2020-08-16 — End: 2020-10-11

## 2020-08-16 MED ORDER — MUPIROCIN 2 % EX OINT: 1.0000 "application " | TOPICAL_OINTMENT | Freq: Two times a day (BID) | CUTANEOUS | 0 refills | Status: DC

## 2020-08-16 NOTE — Patient Instructions (Signed)

## 2020-08-16 NOTE — Assessment & Plan Note (Signed)
Between 4th and 5th toe left foot with hammertoe noted to foot.  Will send in script for Keflex and Mupirocin ointment, educated on use.  Recommend to monitor area closely and if worsening symptoms notify provider immediately.  Return to office in 2 weeks for follow-up.

## 2020-08-16 NOTE — Progress Notes (Signed)
Ht _0  (1.626 m)   Wt 117 lb (53.1 kg)   BMI 20.08 kg/m    Subjective:    Patient ID: Patty Cruz, female    DOB: 1956/06/10, 64 y.o.   MRN: [id number]  HPI: Patty Cruz is a 64 y.o. female  Chief Complaint  Patient presents with  . infected toe    X3 weeks, fifth toe, large, inflammed, tender to touch      . This visit was completed via MyChart due to the restrictions of the COVID-19 pandemic. All issues as above were discussed and addressed. Physical exam was done as above through visual confirmation on MyChart. If it was felt that the patient should be evaluated in the office, they were directed there. The patient verbally consented to this visit. . Location of the patient: home . Location of the provider: work . Those involved with this call:  . Provider: Marnee Guarneri, DNP . CMA: Yvonna Alanis, CMA . Front Desk/Registration: Don Perking  . Time spent on call: 20 minutes with patient face to face via video conference. More than 50% of this time was spent in counseling and coordination of care. 15 minutes total spent in review of patient's record and preparation of their chart.  . I verified patient identity using two factors (patient name and date of birth). Patient consents verbally to being seen via telemedicine visit today.    SKIN INFECTION About three week ago noted a wound between 4th and 5th on left foot.  Area is red and sore to touch.  Has had some serous drainage, most is in pocket on toe.  Has seen podiatry in past for toe issues, but declined surgery, hammertoe.  Denies pain to wound area. Duration: weeks Location: as noted above History of trauma in area: no Pain: no Redness: yes Swelling: no Oozing: yes Pus: no Fevers: no Nausea/vomiting: no Status: fluctuating Treatments attempted:ointment   Relevant past medical, surgical, family and social history reviewed and updated as indicated. Interim medical history since our last visit  reviewed. Allergies and medications reviewed and updated.  Review of Systems  Constitutional: Negative for activity change, appetite change, diaphoresis, fatigue and fever.  Respiratory: Negative for cough, chest tightness and shortness of breath.   Cardiovascular: Negative for chest pain, palpitations and leg swelling.  Skin: Positive for wound.  Psychiatric/Behavioral: Negative.     Per HPI unless specifically indicated above     Objective:    Ht _1  (1.626 m)   Wt 117 lb (53.1 kg)   BMI 20.08 kg/m   Wt Readings from Last 3 Encounters:  08/16/20 117 lb (53.1 kg)  07/20/19 117 lb (53.1 kg)  03/31/19 119 lb (54 kg)    Physical Exam Vitals and nursing note reviewed.  Constitutional:      General: She is awake. She is not in acute distress.    Appearance: She is well-developed. She is not ill-appearing.  HENT:     Head: Normocephalic.     Right Ear: Hearing normal.     Left Ear: Hearing normal.  Eyes:     General: Lids are normal.        Right eye: No discharge.        Left eye: No discharge.     Conjunctiva/sclera: Conjunctivae normal.  Pulmonary:     Effort: Pulmonary effort is normal. No accessory muscle usage or respiratory distress.  Musculoskeletal:     Cervical back: Normal range of motion.  Skin:  Comments: Wound noted between 4th and 5th toe on left foot, approx 1 cm with intact blister.  No drainage noted.  Erythema around wound site.  Patient reports mild warmth and no tenderness.  No edema noted.  Neurological:     Mental Status: She is alert and oriented to person, place, and time.  Psychiatric:        Attention and Perception: Attention normal.        Mood and Affect: Mood normal.        Behavior: Behavior normal. Behavior is cooperative.        Thought Content: Thought content normal.        Judgment: Judgment normal.     Results for orders placed or performed in visit on 02/02/20  Iron, TIBC and Ferritin Panel  Result Value Ref Range    Total Iron Binding Capacity 343 250 - 450 ug/dL   UIBC 230 118 - 369 ug/dL   Iron 113 27 - 139 ug/dL   Iron Saturation 33 15 - 55 %   Ferritin 102 15.0 - 150.0 ng/mL  C-reactive protein  Result Value Ref Range   CRP <1 0 - 10 mg/L  Sed Rate (ESR)  Result Value Ref Range   Sed Rate 2 0 - 40 mm/hr  Thyroid Panel With TSH  Result Value Ref Range   TSH 3.650 0.450 - 4.500 uIU/mL   T4, Total 6.7 4.5 - 12.0 ug/dL   T3 Uptake Ratio 25 24 - 39 %   Free Thyroxine Index 1.7 1.2 - 4.9  SAR CoV2 Serology (COVID 19)AB(IGG)IA  Result Value Ref Range   DiaSorin SARS-CoV-2 Ab, IgG Negative Negative  B12  Result Value Ref Range   Vitamin B-12 1,706 (H) 232 - 1,245 pg/mL  Comprehensive metabolic panel  Result Value Ref Range   Glucose 85 65 - 99 mg/dL   BUN 11 8 - 27 mg/dL   Creatinine, Ser 0.75 0.57 - 1.00 mg/dL   GFR calc non Af Amer 85 >59 mL/min/1.73   GFR calc Af Amer 97 >59 mL/min/1.73   BUN/Creatinine Ratio 15 12 - 28   Sodium 135 134 - 144 mmol/L   Potassium 4.8 3.5 - 5.2 mmol/L   Chloride 95 (L) 96 - 106 mmol/L   CO2 26 20 - 29 mmol/L   Calcium 9.7 8.7 - 10.3 mg/dL   Total Protein 8.0 6.0 - 8.5 g/dL   Albumin 5.1 (H) 3.8 - 4.8 g/dL   Globulin, Total 2.9 1.5 - 4.5 g/dL   Albumin/Globulin Ratio 1.8 1.2 - 2.2   Bilirubin Total 0.3 0.0 - 1.2 mg/dL   Alkaline Phosphatase 133 (H) 39 - 117 IU/L   AST 35 0 - 40 IU/L   ALT 21 0 - 32 IU/L  CBC with Differential/Platelet  Result Value Ref Range   WBC 5.2 3.4 - 10.8 x10E3/uL   RBC 4.21 3.77 - 5.28 x10E6/uL   Hemoglobin 13.4 11.1 - 15.9 g/dL   Hematocrit 40.3 34.0 - 46.6 %   MCV 96 79 - 97 fL   MCH 31.8 26.6 - 33.0 pg   MCHC 33.3 31 - 35 g/dL   RDW 11.5 (L) 11.7 - 15.4 %   Platelets 346 150 - 450 x10E3/uL   Neutrophils 64 Not Estab. %   Lymphs 23 Not Estab. %   Monocytes 10 Not Estab. %   Eos 2 Not Estab. %   Basos 1 Not Estab. %   Neutrophils Absolute 3.3 1 - 7  x10E3/uL   Lymphocytes Absolute 1.2 0 - 3 x10E3/uL   Monocytes  Absolute 0.5 0 - 0 x10E3/uL   EOS (ABSOLUTE) 0.1 0.0 - 0.4 x10E3/uL   Basophils Absolute 0.1 0 - 0 x10E3/uL   Immature Granulocytes 0 Not Estab. %   Immature Grans (Abs) 0.0 0.0 - 0.1 x10E3/uL  VITAMIN D 25 Hydroxy (Vit-D Deficiency, Fractures)  Result Value Ref Range   Vit D, 25-Hydroxy 23.4 (L) 30.0 - 100.0 ng/mL  Hepatitis C antibody  Result Value Ref Range   Hep C Virus Ab <0.1 0.0 - 0.9 s/co ratio  HIV Antibody (routine testing w rflx)  Result Value Ref Range   HIV Screen 4th Generation wRfx Non Reactive Non Reactive  Lipid Panel w/o Chol/HDL Ratio  Result Value Ref Range   Cholesterol, Total 232 (H) 100 - 199 mg/dL   Triglycerides 76 0 - 149 mg/dL   HDL 93 >39 mg/dL   VLDL Cholesterol Cal 13 5 - 40 mg/dL   LDL Chol Calc (NIH) 126 (H) 0 - 99 mg/dL      Assessment & Plan:   Problem List Items Addressed This Visit      Other   Cellulitis of toe of left foot - Primary    Between 4th and 5th toe left foot with hammertoe noted to foot.  Will send in script for Keflex and Mupirocin ointment, educated on use.  Recommend to monitor area closely and if worsening symptoms notify provider immediately.  Return to office in 2 weeks for follow-up.         I discussed the assessment and treatment plan with the patient. The patient was provided an opportunity to ask questions and all were answered. The patient agreed with the plan and demonstrated an understanding of the instructions.   The patient was advised to call back or seek an in-person evaluation if the symptoms worsen or if the condition fails to improve as anticipated.   I provided 21+ minutes of time during this encounter.  Follow up plan: Return in about 2 weeks (around 08/30/2020) for wound check.

## 2020-08-17 NOTE — Progress Notes (Signed)
Pt stated she would call back to make this due to work schedule.

## 2020-10-02 NOTE — Telephone Encounter (Signed)
Needs appt

## 2020-10-11 ENCOUNTER — Encounter: Payer: Self-pay | Admitting: Nurse Practitioner

## 2020-10-11 ENCOUNTER — Other Ambulatory Visit: Payer: Self-pay

## 2020-10-11 ENCOUNTER — Ambulatory Visit (INDEPENDENT_AMBULATORY_CARE_PROVIDER_SITE_OTHER): Payer: 59 | Admitting: Nurse Practitioner

## 2020-10-11 VITALS — BP 129/79 | HR 69 | Temp 97.4°F | Wt 115.0 lb

## 2020-10-11 DIAGNOSIS — Z23 Encounter for immunization: Secondary | ICD-10-CM | POA: Diagnosis not present

## 2020-10-11 DIAGNOSIS — L03032 Cellulitis of left toe: Secondary | ICD-10-CM

## 2020-10-11 DIAGNOSIS — F320 Major depressive disorder, single episode, mild: Secondary | ICD-10-CM | POA: Diagnosis not present

## 2020-10-11 DIAGNOSIS — Z1211 Encounter for screening for malignant neoplasm of colon: Secondary | ICD-10-CM | POA: Diagnosis not present

## 2020-10-11 MED ORDER — PREDNISONE 20 MG PO TABS: 40.0000 mg | ORAL_TABLET | Freq: Every day | ORAL | 0 refills | Status: AC

## 2020-10-11 MED ORDER — VENLAFAXINE HCL 25 MG PO TABS
50.0000 mg | ORAL_TABLET | Freq: Every day | ORAL | 4 refills | Status: DC
Start: 2020-10-11 — End: 2021-11-06

## 2020-10-11 MED ORDER — CEPHALEXIN 500 MG PO CAPS: 500.0000 mg | ORAL_CAPSULE | Freq: Four times a day (QID) | ORAL | 0 refills | Status: AC

## 2020-10-11 MED ORDER — HYDROXYZINE PAMOATE 25 MG PO CAPS
25.0000 mg | ORAL_CAPSULE | Freq: Three times a day (TID) | ORAL | 1 refills | Status: DC | PRN
Start: 2020-10-11 — End: 2021-04-23

## 2020-10-11 NOTE — Progress Notes (Signed)
BP 129/79   Pulse 69   Temp (!) 97.4 F (36.3 C)   Wt 115 lb (52.2 kg)   SpO2 99%   BMI 19.74 kg/m    Subjective:    Patient ID: Patty Cruz, female    DOB: 02/24/1956, 64 y.o.   MRN: [id number]  HPI: Patty Cruz is a 64 y.o. female  Chief Complaint  Patient presents with  . Toe Pain    pt states about 8 weeks ago, between her fourth and fifth toe on her left foot she has an open wound that was draining, red, swollen, and tender   SKIN INFECTION Follow-up for toe wound.  About eight weeks ago noted a wound between 4th and 5th on left foot.  Reports it is improving over past 24 hours.  Has seen podiatry in past for toe issues, but declined surgery, hammertoe.  Treated with Keflex and Mupirocin on 08/16/20. Duration: weeks Location: as noted above History of trauma in area: no Pain: no Redness: yes Swelling: no Oozing: yes Pus: no Fevers: no Nausea/vomiting: no Status: fluctuating Treatments attempted:ointment   DEPRESSION Currently taking Effexor 50 MG daily and Vistaril as needed, has more difficult time during seasons.   Mood status: stable Satisfied with current treatment?: yes Symptom severity: moderate  Duration of current treatment : chronic Side effects: no Medication compliance: good compliance Psychotherapy/counseling: none Depressed mood: yes Anxious mood: no Anhedonia: no Significant weight loss or gain: no Insomnia: none Fatigue: yes Feelings of worthlessness or guilt: yes Impaired concentration/indecisiveness: no Suicidal ideations: no Hopelessness: no Crying spells: no Depression screen [facility] 2/9 10/11/2020 08/16/2020 12/16/2019 07/20/2019 04/28/2019  Decreased Interest 1 1 0 0 1  Down, Depressed, Hopeless 2 1 0 0 1  PHQ - 2 Score 3 2 0 0 2  Altered sleeping 2 0 $R'1 3 3  'JB$ Tired, decreased energy $RemoveBeforeDE'3 2 3 3 3  'ybwArmmeXFvMZGu$ Change in appetite 0 0 0 0 0  Feeling bad or failure about yourself  1 0 0 1 1  Trouble concentrating 0 0 1 0 1  Moving slowly or  fidgety/restless 0 0 0 0 0  Suicidal thoughts 0 0 0 0 0  PHQ-9 Score $RemoveBef'9 4 5 7 10  'sCRcJHkzrE$ Difficult doing work/chores Somewhat difficult Not difficult at all Somewhat difficult Not difficult at all Somewhat difficult    Relevant past medical, surgical, family and social history reviewed and updated as indicated. Interim medical history since our last visit reviewed. Allergies and medications reviewed and updated.  Review of Systems  Constitutional: Negative for activity change, appetite change, diaphoresis, fatigue and fever.  Respiratory: Negative for cough, chest tightness and shortness of breath.   Cardiovascular: Negative for chest pain, palpitations and leg swelling.  Skin: Positive for wound.  Psychiatric/Behavioral: Negative.     Per HPI unless specifically indicated above     Objective:    BP 129/79   Pulse 69   Temp (!) 97.4 F (36.3 C)   Wt 115 lb (52.2 kg)   SpO2 99%   BMI 19.74 kg/m   Wt Readings from Last 3 Encounters:  10/11/20 115 lb (52.2 kg)  08/16/20 117 lb (53.1 kg)  07/20/19 117 lb (53.1 kg)    Physical Exam Vitals and nursing note reviewed.  Constitutional:      General: She is awake. She is not in acute distress.    Appearance: She is well-developed. She is not ill-appearing.  HENT:     Head: Normocephalic.  Right Ear: Hearing normal.     Left Ear: Hearing normal.  Eyes:     General: Lids are normal.        Right eye: No discharge.        Left eye: No discharge.     Conjunctiva/sclera: Conjunctivae normal.  Cardiovascular:     Pulses:          Dorsalis pedis pulses are 2+ on the right side and 2+ on the left side.       Posterior tibial pulses are 2+ on the right side and 2+ on the left side.  Pulmonary:     Effort: Pulmonary effort is normal. No accessory muscle usage or respiratory distress.  Musculoskeletal:     Cervical back: Normal range of motion.  Feet:     Right foot:     Skin integrity: Skin integrity normal.     Left foot:      Skin integrity: Ulcer present.  Skin:    Findings: Wound present.     Comments: Wound noted between 4th and 5th toe on left foot, approx 1/2 cm with darker wound bed and scaling around exterior.  No drainage noted.  Very mild erythema around wound site.  No warmth, but mild tenderness present.  No edema noted. Hammer toe present left second toe.  Neurological:     Mental Status: She is alert and oriented to person, place, and time.  Psychiatric:        Attention and Perception: Attention normal.        Mood and Affect: Mood normal.        Behavior: Behavior normal. Behavior is cooperative.        Thought Content: Thought content normal.        Judgment: Judgment normal.     Results for orders placed or performed in visit on 02/02/20  Iron, TIBC and Ferritin Panel  Result Value Ref Range   Total Iron Binding Capacity 343 250 - 450 ug/dL   UIBC 230 118 - 369 ug/dL   Iron 113 27 - 139 ug/dL   Iron Saturation 33 15 - 55 %   Ferritin 102 15.0 - 150.0 ng/mL  C-reactive protein  Result Value Ref Range   CRP <1 0 - 10 mg/L  Sed Rate (ESR)  Result Value Ref Range   Sed Rate 2 0 - 40 mm/hr  Thyroid Panel With TSH  Result Value Ref Range   TSH 3.650 0.450 - 4.500 uIU/mL   T4, Total 6.7 4.5 - 12.0 ug/dL   T3 Uptake Ratio 25 24 - 39 %   Free Thyroxine Index 1.7 1.2 - 4.9  SAR CoV2 Serology (COVID 19)AB(IGG)IA  Result Value Ref Range   DiaSorin SARS-CoV-2 Ab, IgG Negative Negative  B12  Result Value Ref Range   Vitamin B-12 1,706 (H) 232 - 1,245 pg/mL  Comprehensive metabolic panel  Result Value Ref Range   Glucose 85 65 - 99 mg/dL   BUN 11 8 - 27 mg/dL   Creatinine, Ser 0.75 0.57 - 1.00 mg/dL   GFR calc non Af Amer 85 >59 mL/min/1.73   GFR calc Af Amer 97 >59 mL/min/1.73   BUN/Creatinine Ratio 15 12 - 28   Sodium 135 134 - 144 mmol/L   Potassium 4.8 3.5 - 5.2 mmol/L   Chloride 95 (L) 96 - 106 mmol/L   CO2 26 20 - 29 mmol/L   Calcium 9.7 8.7 - 10.3 mg/dL   Total Protein 8.0  6.0 -  8.5 g/dL   Albumin 5.1 (H) 3.8 - 4.8 g/dL   Globulin, Total 2.9 1.5 - 4.5 g/dL   Albumin/Globulin Ratio 1.8 1.2 - 2.2   Bilirubin Total 0.3 0.0 - 1.2 mg/dL   Alkaline Phosphatase 133 (H) 39 - 117 IU/L   AST 35 0 - 40 IU/L   ALT 21 0 - 32 IU/L  CBC with Differential/Platelet  Result Value Ref Range   WBC 5.2 3.4 - 10.8 x10E3/uL   RBC 4.21 3.77 - 5.28 x10E6/uL   Hemoglobin 13.4 11.1 - 15.9 g/dL   Hematocrit 40.3 34.0 - 46.6 %   MCV 96 79 - 97 fL   MCH 31.8 26.6 - 33.0 pg   MCHC 33.3 31 - 35 g/dL   RDW 11.5 (L) 11.7 - 15.4 %   Platelets 346 150 - 450 x10E3/uL   Neutrophils 64 Not Estab. %   Lymphs 23 Not Estab. %   Monocytes 10 Not Estab. %   Eos 2 Not Estab. %   Basos 1 Not Estab. %   Neutrophils Absolute 3.3 1.40 - 7.00 x10E3/uL   Lymphocytes Absolute 1.2 0 - 3 x10E3/uL   Monocytes Absolute 0.5 0 - 0 x10E3/uL   EOS (ABSOLUTE) 0.1 0.0 - 0.4 x10E3/uL   Basophils Absolute 0.1 0 - 0 x10E3/uL   Immature Granulocytes 0 Not Estab. %   Immature Grans (Abs) 0.0 0.0 - 0.1 x10E3/uL  VITAMIN D 25 Hydroxy (Vit-D Deficiency, Fractures)  Result Value Ref Range   Vit D, 25-Hydroxy 23.4 (L) 30.0 - 100.0 ng/mL  Hepatitis C antibody  Result Value Ref Range   Hep C Virus Ab <0.1 0.0 - 0.9 s/co ratio  HIV Antibody (routine testing w rflx)  Result Value Ref Range   HIV Screen 4th Generation wRfx Non Reactive Non Reactive  Lipid Panel w/o Chol/HDL Ratio  Result Value Ref Range   Cholesterol, Total 232 (H) 100 - 199 mg/dL   Triglycerides 76 0 - 149 mg/dL   HDL 93 >39 mg/dL   VLDL Cholesterol Cal 13 5 - 40 mg/dL   LDL Chol Calc (NIH) 126 (H) 0 - 99 mg/dL      Assessment & Plan:   Problem List Items Addressed This Visit      Other   Depression, major, single episode, mild (HCC) - Primary    Chronic, stable with current Effexor dose + PRN Vistaril.  Continue current medication regimen and adjust as needed.  Denies SI/HI.   Her symptoms become worse seasonally, have recommended adding  on Wellbutrin which may offer benefit to her fatigue and seasonal mood changes.  She wishes to read up on this and will let provider know if wishes to trial.  Return in 6 months, sooner if worsening mood.      Relevant Medications   hydrOXYzine (VISTARIL) 25 MG capsule   venlafaxine (EFFEXOR) 25 MG tablet   Cellulitis of toe of left foot    Wound between 4th and 5th toe left foot with hammertoe noted to foot.  Will send in script for Keflex and Prednisone due to mild ongoing symptoms, educated on use.  Recommend to monitor area closely and if worsening symptoms notify provider immediately.  Referral to podiatry for further assessment and recommendations.  Check labs today to include CBC, BMP, A1C. Return to office in 2 weeks for follow-up.      Relevant Orders   CBC with Differential/Platelet   Basic metabolic panel   HgB S8O   Ambulatory referral  to Podiatry    Other Visit Diagnoses    Need for influenza vaccination       Flu vaccine today   Relevant Orders   Flu Vaccine QUAD High Dose(Fluad) (Completed)   Colon cancer screening       Cologuard ordered   Relevant Orders   Cologuard       Follow up plan: Return in about 2 weeks (around 10/25/2020) for toe check.

## 2020-10-11 NOTE — Assessment & Plan Note (Signed)
Chronic, stable with current Effexor dose + PRN Vistaril.  Continue current medication regimen and adjust as needed.  Denies SI/HI.   Her symptoms become worse seasonally, have recommended adding on Wellbutrin which may offer benefit to her fatigue and seasonal mood changes.  She wishes to read up on this and will let provider know if wishes to trial.  Return in 6 months, sooner if worsening mood.

## 2020-10-11 NOTE — Assessment & Plan Note (Addendum)
Wound between 4th and 5th toe left foot with hammertoe noted to foot.  Will send in script for Keflex and Prednisone due to mild ongoing symptoms, educated on use.  Recommend to monitor area closely and if worsening symptoms notify provider immediately.  Referral to podiatry for further assessment and recommendations.  Check labs today to include CBC, BMP, A1C. Return to office in 2 weeks for follow-up.

## 2020-10-11 NOTE — Patient Instructions (Signed)
Bupropion tablets (Depression/Mood Disorders) What is this medicine? BUPROPION (byoo PROE pee on) is used to treat depression. This medicine may be used for other purposes; ask your health care provider or pharmacist if you have questions. COMMON BRAND NAME(S): Wellbutrin What should I tell my health care provider before I take this medicine? They need to know if you have any of these conditions:  an eating disorder, such as anorexia or bulimia  bipolar disorder or psychosis  diabetes or high blood sugar, treated with medication  glaucoma  heart disease, previous heart attack, or irregular heart beat  head injury or brain tumor  high blood pressure  kidney or liver disease  seizures  suicidal thoughts or a previous suicide attempt  Tourette's syndrome  weight loss  an unusual or allergic reaction to bupropion, other medicines, foods, dyes, or preservatives  breast-feeding  pregnant or trying to become pregnant How should I use this medicine? Take this medicine by mouth with a glass of water. Follow the directions on the prescription label. You can take it with or without food. If it upsets your stomach, take it with food. Take your medicine at regular intervals. Do not take your medicine more often than directed. Do not stop taking this medicine suddenly except upon the advice of your doctor. Stopping this medicine too quickly may cause serious side effects or your condition may worsen. A special MedGuide will be given to you by the pharmacist with each prescription and refill. Be sure to read this information carefully each time. Talk to your pediatrician regarding the use of this medicine in children. Special care may be needed. Overdosage: If you think you have taken too much of this medicine contact a poison control center or emergency room at once. NOTE: This medicine is only for you. Do not share this medicine with others. What if I miss a dose? If you miss a dose,  take it as soon as you can. If it is less than four hours to your next dose, take only that dose and skip the missed dose. Do not take double or extra doses. What may interact with this medicine? Do not take this medicine with any of the following medications:  linezolid  MAOIs like Azilect, Carbex, Eldepryl, Marplan, Nardil, and Parnate  methylene blue (injected into a vein)  other medicines that contain bupropion like Zyban This medicine may also interact with the following medications:  alcohol  certain medicines for anxiety or sleep  certain medicines for blood pressure like metoprolol, propranolol  certain medicines for depression or psychotic disturbances  certain medicines for HIV or AIDS like efavirenz, lopinavir, nelfinavir, ritonavir  certain medicines for irregular heart beat like propafenone, flecainide  certain medicines for Parkinson's disease like amantadine, levodopa  certain medicines for seizures like carbamazepine, phenytoin, phenobarbital  cimetidine  clopidogrel  cyclophosphamide  digoxin  furazolidone  isoniazid  nicotine  orphenadrine  procarbazine  steroid medicines like prednisone or cortisone  stimulant medicines for attention disorders, weight loss, or to stay awake  tamoxifen  theophylline  thiotepa  ticlopidine  tramadol  warfarin This list may not describe all possible interactions. Give your health care provider a list of all the medicines, herbs, non-prescription drugs, or dietary supplements you use. Also tell them if you smoke, drink alcohol, or use illegal drugs. Some items may interact with your medicine. What should I watch for while using this medicine? Tell your doctor if your symptoms do not get better or if they get worse.   Visit your doctor or healthcare provider for regular checks on your progress. Because it may take several weeks to see the full effects of this medicine, it is important to continue your  treatment as prescribed by your doctor. This medicine may cause serious skin reactions. They can happen weeks to months after starting the medicine. Contact your healthcare provider right away if you notice fevers or flu-like symptoms with a rash. The rash may be red or purple and then turn into blisters or peeling of the skin. Or, you might notice a red rash with swelling of the face, lips or lymph nodes in your neck or under your arms. Patients and their families should watch out for new or worsening thoughts of suicide or depression. Also watch out for sudden changes in feelings such as feeling anxious, agitated, panicky, irritable, hostile, aggressive, impulsive, severely restless, overly excited and hyperactive, or not being able to sleep. If this happens, especially at the beginning of treatment or after a change in dose, call your healthcare provider. Avoid alcoholic drinks while taking this medicine. Drinking excessive alcoholic beverages, using sleeping or anxiety medicines, or quickly stopping the use of these agents while taking this medicine may increase your risk for a seizure. Do not drive or use heavy machinery until you know how this medicine affects you. This medicine can impair your ability to perform these tasks. Do not take this medicine close to bedtime. It may prevent you from sleeping. Your mouth may get dry. Chewing sugarless gum or sucking hard candy, and drinking plenty of water may help. Contact your doctor if the problem does not go away or is severe. What side effects may I notice from receiving this medicine? Side effects that you should report to your doctor or health care professional as soon as possible:  allergic reactions like skin rash, itching or hives, swelling of the face, lips, or tongue  breathing problems  changes in vision  confusion  elevated mood, decreased need for sleep, racing thoughts, impulsive behavior  fast or irregular  heartbeat  hallucinations, loss of contact with reality  increased blood pressure  rash, fever, and swollen lymph nodes  redness, blistering, peeling, or loosening of the skin, including inside the mouth  seizures  suicidal thoughts or other mood changes  unusually weak or tired  vomiting Side effects that usually do not require medical attention (report to your doctor or health care professional if they continue or are bothersome):  constipation  headache  loss of appetite  nausea  tremors  weight loss This list may not describe all possible side effects. Call your doctor for medical advice about side effects. You may report side effects to FDA at 1-800-FDA-1088. Where should I keep my medicine? Keep out of the reach of children. Store at room temperature between 20 and 25 degrees C (68 and 77 degrees F), away from direct sunlight and moisture. Keep tightly closed. Throw away any unused medicine after the expiration date. NOTE: This sheet is a summary. It may not cover all possible information. If you have questions about this medicine, talk to your doctor, pharmacist, or health care provider.  2020 Elsevier/Gold Standard (2019-01-21 14:02:47)  

## 2020-10-12 LAB — HEMOGLOBIN A1C
Est. average glucose Bld gHb Est-mCnc: 103 mg/dL
Hgb A1c MFr Bld: 5.2 % (ref 4.8–5.6)

## 2020-10-12 LAB — BASIC METABOLIC PANEL
BUN/Creatinine Ratio: 18 (ref 12–28)
BUN: 11 mg/dL (ref 8–27)
CO2: 21 mmol/L (ref 20–29)
Calcium: 9.3 mg/dL (ref 8.7–10.3)
Chloride: 97 mmol/L (ref 96–106)
Creatinine, Ser: 0.6 mg/dL (ref 0.57–1.00)
GFR calc Af Amer: 111 mL/min/{1.73_m2} (ref >59)
GFR calc non Af Amer: 96 mL/min/{1.73_m2} (ref >59)
Glucose: 72 mg/dL (ref 65–99)
Potassium: 4.6 mmol/L (ref 3.5–5.2)
Sodium: 135 mmol/L (ref 134–144)

## 2020-10-12 LAB — CBC WITH DIFFERENTIAL/PLATELET
Basophils Absolute: 0.1 10*3/uL (ref 0.0–0.2)
Basos: 1 %
EOS (ABSOLUTE): 0.1 10*3/uL (ref 0.0–0.4)
Eos: 2 %
Hematocrit: 35.5 % (ref 34.0–46.6)
Hemoglobin: 12 g/dL (ref 11.1–15.9)
Immature Grans (Abs): 0 10*3/uL (ref 0.0–0.1)
Immature Granulocytes: 0 %
Lymphocytes Absolute: 1.1 10*3/uL (ref 0.7–3.1)
Lymphs: 20 %
MCH: 32.8 pg (ref 26.6–33.0)
MCHC: 33.8 g/dL (ref 31.5–35.7)
MCV: 97 fL (ref 79–97)
Monocytes Absolute: 0.5 10*3/uL (ref 0.1–0.9)
Monocytes: 10 %
Neutrophils Absolute: 3.6 10*3/uL (ref 1.4–7.0)
Neutrophils: 67 %
Platelets: 382 10*3/uL (ref 150–450)
RBC: 3.66 x10E6/uL — ABNORMAL LOW (ref 3.77–5.28)
RDW: 11.6 % — ABNORMAL LOW (ref 11.7–15.4)
WBC: 5.3 10*3/uL (ref 3.4–10.8)

## 2020-10-12 NOTE — Progress Notes (Signed)
Contacted via MyChart  Good afternoon Patty Cruz, your labs have returned.  The good news is white blood cell count and neutrophils remain normal, this looks at infection.  Overall these labs look great and no concerns noted.  Let me know if you do not hear from podiatry.  It looks like they may have tried to call you and left voicemail, I recommend calling [facility] here in [place] to schedule.  Have a great day!! Keep being awesome!!  Thank you for allowing me to participate in your care. Kindest regards, Corinne Goucher

## 2020-11-01 ENCOUNTER — Ambulatory Visit: Payer: 59 | Admitting: Nurse Practitioner

## 2020-11-06 ENCOUNTER — Ambulatory Visit: Payer: 59 | Admitting: Nurse Practitioner

## 2021-01-01 ENCOUNTER — Ambulatory Visit: Payer: 59 | Admitting: Podiatry

## 2021-01-01 ENCOUNTER — Other Ambulatory Visit: Payer: Self-pay

## 2021-01-01 ENCOUNTER — Encounter: Payer: Self-pay | Admitting: Podiatry

## 2021-01-01 ENCOUNTER — Ambulatory Visit (INDEPENDENT_AMBULATORY_CARE_PROVIDER_SITE_OTHER): Payer: 59 | Admitting: Podiatry

## 2021-01-01 DIAGNOSIS — M2012 Hallux valgus (acquired), left foot: Secondary | ICD-10-CM

## 2021-01-01 DIAGNOSIS — M79671 Pain in right foot: Secondary | ICD-10-CM

## 2021-01-01 DIAGNOSIS — M2042 Other hammer toe(s) (acquired), left foot: Secondary | ICD-10-CM | POA: Diagnosis not present

## 2021-01-01 DIAGNOSIS — M21612 Bunion of left foot: Secondary | ICD-10-CM

## 2021-01-01 DIAGNOSIS — M79672 Pain in left foot: Secondary | ICD-10-CM

## 2021-01-01 DIAGNOSIS — M21611 Bunion of right foot: Secondary | ICD-10-CM

## 2021-01-01 DIAGNOSIS — M2041 Other hammer toe(s) (acquired), right foot: Secondary | ICD-10-CM

## 2021-01-01 DIAGNOSIS — L84 Corns and callosities: Secondary | ICD-10-CM

## 2021-01-01 DIAGNOSIS — M2011 Hallux valgus (acquired), right foot: Secondary | ICD-10-CM

## 2021-01-01 MED ORDER — KETOCONAZOLE 2 % EX CREA: 1.0000 "application " | TOPICAL_CREAM | Freq: Every day | CUTANEOUS | 2 refills | Status: DC

## 2021-01-01 NOTE — Patient Instructions (Signed)
More silicone pads can be purchased from:  [email or web address]  

## 2021-01-01 NOTE — Progress Notes (Signed)
  Subjective:  Patient ID: Patty Cruz, female    DOB: 1956/03/16,  MRN: [id number]  Chief Complaint  Patient presents with  . Callouses    Patient presents today for painful corn inside left 5th toe x 3-4 months. She says it stings and burns at random times.  She has been using mupirocin ointment for treatment    65 y.o. female presents with the above complaint. History confirmed with patient.  She also has similar lesions on the lateral third and medial fourth toe on the right side.  Very painful.  She usually wears some offloading pads which helps some.  On the left side it feels like it cracked open and had some drainage one-point like it was an open wound.  Objective:  Physical Exam: warm, good capillary refill, no trophic changes or ulcerative lesions, normal DP and PT pulses and normal sensory exam.  Bilaterally she has moderate to severe hallux valgus, tailor's bunions and semirigid hammertoes.  The worst of which is the second toe bilaterally which is nearly crossed over.  The fourth toe on the right foot has an adductovarus component and is underlapping the third toe which is causing heloma molle on the facing sides.  The fifth toe on the left side has a medial facing heloma molle as well.  Mild interdigital maceration.  No active drainage or purulence.  No cellulitis. Assessment:   1. Hammertoe of right foot   2. Hammertoe of left foot   3. Heloma molle   4. Hallux valgus with bunions, left   5. Hallux valgus with bunions, right   6. Pain in both feet      Plan:  Patient was evaluated and treated and all questions answered.   Discussed etiology and treatment options of the heloma molle in detail with the patient and how these relate to the plantar callus that she had and the hammertoes that she has.  We discussed surgical nonsurgical treatment options including offloading, shaving the lesions down.  I think also the left side she probably had a tinea infection with a fissure.   I have prescribed her ketoconazole cream to apply a light layer to alleviate this.  I shave the lesions down debrided them with a chisel blade to her tolerance.  Silicone toe pads were dispensed to her.  We also discussed surgical treatment should this fail and what this would entail.  She works on her feet at [employer] and right now would not be able take off work.  If she is able to take off 3 to 4 weeks I think this would be reasonable that we would be able to correct the deformities to alleviate the lesions.  Return if symptoms worsen or fail to improve.

## 2021-01-31 ENCOUNTER — Other Ambulatory Visit: Payer: Self-pay | Admitting: Nurse Practitioner

## 2021-01-31 NOTE — Telephone Encounter (Signed)
Requested medication (s) are due for refill today - no  Requested medication (s) are on the active medication list -no  Future visit scheduled -no  Last refill: 07/10/20  Notes to clinic: Request RF of medication no longer on current med list  Requested Prescriptions  Pending Prescriptions Disp Refills   amitriptyline (ELAVIL) 10 MG tablet [Pharmacy Med Name: AMITRIPTYLINE HCL 10 MG TAB] 90 tablet     Sig: TAKE 1 TABLET BY MOUTH AT BEDTIME      Psychiatry:  Antidepressants - Heterocyclics (TCAs) Passed - 01/31/2021  2:13 PM      Passed - Completed PHQ-2 or PHQ-9 in the last 360 days      Passed - Valid encounter within last 6 months    Recent Outpatient Visits           3 months ago Depression, major, single episode, mild (HCC)   [facility] [place], Jolene T, NP   5 months ago Cellulitis of toe of left foot   [facility] [place], [place] T, NP   12 months ago Chronic fatigue   [facility] [place], [place] T, NP   1 year ago Depression, major, single episode, mild (HCC)   [facility] [place], Jolene T, NP   1 year ago Depression, major, single episode, mild (HCC)   [facility] [place], [place] T, NP                    Requested Prescriptions  Pending Prescriptions Disp Refills   amitriptyline (ELAVIL) 10 MG tablet [Pharmacy Med Name: AMITRIPTYLINE HCL 10 MG TAB] 90 tablet     Sig: TAKE 1 TABLET BY MOUTH AT BEDTIME      Psychiatry:  Antidepressants - Heterocyclics (TCAs) Passed - 01/31/2021  2:13 PM      Passed - Completed PHQ-2 or PHQ-9 in the last 360 days      Passed - Valid encounter within last 6 months    Recent Outpatient Visits           3 months ago Depression, major, single episode, mild (HCC)   [facility] [place], Jolene T, NP   5 months ago Cellulitis of toe of left foot   [facility] [place], [place] T, NP   12 months ago Chronic fatigue   [facility] [place], [place] T, NP   1 year ago Depression, major, single episode, mild (HCC)   [facility] [place], Jolene T, NP   1 year ago Depression, major, single episode, mild (HCC)   [facility] [place], Dorie Rank, NP

## 2021-01-31 NOTE — Telephone Encounter (Signed)
Routing to provider  

## 2021-03-31 ENCOUNTER — Encounter: Payer: Self-pay | Admitting: Nurse Practitioner

## 2021-03-31 DIAGNOSIS — E559 Vitamin D deficiency, unspecified: Secondary | ICD-10-CM | POA: Insufficient documentation

## 2021-03-31 DIAGNOSIS — E7800 Pure hypercholesterolemia, unspecified: Secondary | ICD-10-CM | POA: Insufficient documentation

## 2021-04-06 ENCOUNTER — Ambulatory Visit: Payer: 59 | Admitting: Nurse Practitioner

## 2021-04-23 ENCOUNTER — Other Ambulatory Visit: Payer: Self-pay

## 2021-04-23 ENCOUNTER — Ambulatory Visit (INDEPENDENT_AMBULATORY_CARE_PROVIDER_SITE_OTHER): Payer: 59 | Admitting: Nurse Practitioner

## 2021-04-23 ENCOUNTER — Encounter: Payer: Self-pay | Admitting: Nurse Practitioner

## 2021-04-23 VITALS — BP 111/71 | HR 64 | Temp 98.6°F | Wt 117.0 lb

## 2021-04-23 DIAGNOSIS — R519 Headache, unspecified: Secondary | ICD-10-CM

## 2021-04-23 DIAGNOSIS — R7989 Other specified abnormal findings of blood chemistry: Secondary | ICD-10-CM

## 2021-04-23 DIAGNOSIS — Z800 Family history of malignant neoplasm of digestive organs: Secondary | ICD-10-CM

## 2021-04-23 DIAGNOSIS — E538 Deficiency of other specified B group vitamins: Secondary | ICD-10-CM

## 2021-04-23 DIAGNOSIS — F320 Major depressive disorder, single episode, mild: Secondary | ICD-10-CM | POA: Diagnosis not present

## 2021-04-23 MED ORDER — RIZATRIPTAN BENZOATE 5 MG PO TABS: 5.0000 mg | ORAL_TABLET | ORAL | 0 refills | Status: DC | PRN

## 2021-04-23 MED ORDER — GABAPENTIN 100 MG PO CAPS: 100.0000 mg | ORAL_CAPSULE | Freq: Every day | ORAL | 2 refills | Status: DC

## 2021-04-23 NOTE — Assessment & Plan Note (Signed)
Chronic, stable with current Effexor dose + PRN Vistaril.  Continue current medication regimen and adjust as needed.  Denies SI/HI.   Her symptoms become worse seasonally, have recommended adding on Wellbutrin which may offer benefit to her fatigue and seasonal mood changes.  Return in 6 months, sooner if worsening mood.

## 2021-04-23 NOTE — Assessment & Plan Note (Signed)
History of low levels, continue supplement and recheck today.

## 2021-04-23 NOTE — Assessment & Plan Note (Signed)
In mother -- check lipase and amylase today. 

## 2021-04-23 NOTE — Assessment & Plan Note (Addendum)
New onset in December with increased frequency over past months.  No red flags on exam.  Related to chronic fatigue vs migraine? Will obtain labs today to include CBC, CMP, TSH, iron and magnesium.  Is taking Effexor at this time for mood and had recent loss of grandchild.  Will trial low dose  Gabapentin at night and assess if benefit to headaches -- did not tolerate Amitriptyline.  Continue use of Tylenol or Ibuprofen as needed at home + will add on Maxalt as needed, script sent.  Consider addition of Zofran if ongoing nausea with headaches.  Will obtain imaging CT scan of head to further assess.  Return in 6 weeks.

## 2021-04-23 NOTE — Assessment & Plan Note (Signed)
Noted on past labs, has ongoing fatigue. Recheck TSH today.
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
## 2021-04-23 NOTE — Patient Instructions (Signed)
Chronic Migraine Headache A migraine headache is throbbing pain that is usually on one side of the head. Migraines that keep coming back are called recurring migraines. A migraine is called a chronic migraine if it happens at least 15 days in a month for more than 3 months. Talk with your doctor about what things may bring on (trigger) your migraines. What are the causes? The exact cause of this condition is not known. A migraine may be caused when nerves in the brain become irritated and release chemicals that cause irritation and swelling (inflammation) of blood vessels. The irritation and swelling of the blood vessels causes pain. Migraines may be brought on or caused by: Smoking. Foods and drinks, such as: Cheese. Chocolate. Alcohol. Caffeine. Certain substances in some foods or drinks. Some medicines. Other things that may bring on a migraine include: Periods, for women. Stress. Not enough sleep or too much sleep. Feeling very tired. Bright lights or loud noises. Smells Weather changes and being at high altitude. What increases the risk? The following factors may make you more likely to have chronic migraine: Having migraines or family members who have them. Being very sad (depressed) or feeling worried or nervous (anxious). Taking a lot of pain medicine. Having problems sleeping. Having heart disease, diabetes, or being very overweight (obese). What are the signs or symptoms? Symptoms of this condition include: Pain that feels like it throbs. Pain that is usually only on one side of the head. In some cases, the pain may be on both sides of the head or around the head or neck. Very bad pain that keeps you from doing daily activities. Pain that gets worse with activity. Feeling like you may vomit (feeling nauseous) or vomiting. Pain when you are around bright lights, loud noises, or activity. Being sensitive to bright lights, loud noises, or smells. Feeling dizzy. How is  this treated? This condition is treated with: Medicines. These help to: Lessen pain and the feeling like you may vomit. Prevent migraines. Changes to your diet or sleep. Therapy. This might include: Relaxation training. Biofeedback. This is a treatment that teaches you to relax, use your brain to lower your heart rate, and control your breathing. Cognitive behavioral therapy (CBT). This therapy helps you set goals and follow up on the changes that you make. Acupuncture. Using a device that provides electrical stimulation to your nerves, which can help take away pain. Surgery, if the other treatments do not work. Follow these instructions at home: Medicines Take over-the-counter and prescription medicines only as told by your doctor. Ask your doctor if the medicine prescribed to you requires you to avoid driving or using machinery. Lifestyle  Do not use any products that contain nicotine or tobacco, such as cigarettes, e-cigarettes, and chewing tobacco. If you need help quitting, ask your doctor. Do not drink alcohol. Get 7-9 hours of sleep each night. Lower the stress in your life. Ask your doctor about ways to do this. Stay at a healthy weight. Talk with your doctor if you need help losing weight. Get regular exercise. General instructions  Keep a journal to find out if certain things bring on migraines. For example, write down: What you eat and drink. How much sleep you get. Any change to your diet or medicines. Lie down in a dark, quiet room when you have a migraine. Try placing a cool towel over your head when you have a migraine. Keep lights dim if bright lights bother you or make your migraines worse. Keep   all follow-up visits as told by your doctor. This is important. Where to find more information Coalition for Headache and Migraine Patients (CHAMP): headachemigraine.org American Migraine Foundation: americanmigrainefoundation.org National Headache Foundation:  headaches.org Contact a doctor if: Medicine does not help your migraine. Your pain keeps coming back. Get help right away if: Your migraine becomes really bad and medicine does not help. You have a stiff neck and fever. You have trouble seeing. Your muscles are weak or you lose control of them. You lose your balance or have trouble walking. You feel like you will faint or you faint. You start having sudden, very bad headaches. You have a seizure. Summary A migraine headache is very bad, throbbing pain that is usually on one side of the head. A chronic migraine is a migraine that happens 15 days in a month for more than 3 months. Talk with your doctor about what things may bring on your migraines. Lie down in a dark, quiet room when you have a migraine. Keep a journal. This can help you find out if certain things make you have migraines. This information is not intended to replace advice given to you by your health care provider. Make sure you discuss any questions you have with your health care provider. Document Revised: 12/15/2019 Document Reviewed: 12/15/2019 Elsevier Patient Education  2022 Elsevier Inc.  

## 2021-04-24 LAB — CBC WITH DIFFERENTIAL/PLATELET
Basophils Absolute: 0.1 10*3/uL (ref 0.0–0.2)
Basos: 1 %
EOS (ABSOLUTE): 0.1 10*3/uL (ref 0.0–0.4)
Eos: 2 %
Hematocrit: 37.5 % (ref 34.0–46.6)
Hemoglobin: 12.7 g/dL (ref 11.1–15.9)
Immature Grans (Abs): 0 10*3/uL (ref 0.0–0.1)
Immature Granulocytes: 0 %
Lymphocytes Absolute: 1.4 10*3/uL (ref 0.7–3.1)
Lymphs: 24 %
MCH: 32.6 pg (ref 26.6–33.0)
MCHC: 33.9 g/dL (ref 31.5–35.7)
MCV: 96 fL (ref 79–97)
Monocytes Absolute: 0.6 10*3/uL (ref 0.1–0.9)
Monocytes: 10 %
Neutrophils Absolute: 3.8 10*3/uL (ref 1.4–7.0)
Neutrophils: 63 %
Platelets: 332 10*3/uL (ref 150–450)
RBC: 3.9 x10E6/uL (ref 3.77–5.28)
RDW: 11.8 % (ref 11.7–15.4)
WBC: 6 10*3/uL (ref 3.4–10.8)

## 2021-04-24 LAB — COMPREHENSIVE METABOLIC PANEL
ALT: 21 IU/L (ref 0–32)
AST: 23 IU/L (ref 0–40)
Albumin/Globulin Ratio: 1.6 (ref 1.2–2.2)
Albumin: 4.6 g/dL (ref 3.8–4.8)
Alkaline Phosphatase: 124 IU/L — ABNORMAL HIGH (ref 44–121)
BUN/Creatinine Ratio: 15 (ref 12–28)
BUN: 10 mg/dL (ref 8–27)
Bilirubin Total: 0.3 mg/dL (ref 0.0–1.2)
CO2: 23 mmol/L (ref 20–29)
Calcium: 9.2 mg/dL (ref 8.7–10.3)
Chloride: 92 mmol/L — ABNORMAL LOW (ref 96–106)
Creatinine, Ser: 0.66 mg/dL (ref 0.57–1.00)
Globulin, Total: 2.8 g/dL (ref 1.5–4.5)
Glucose: 81 mg/dL (ref 65–99)
Potassium: 4.1 mmol/L (ref 3.5–5.2)
Sodium: 131 mmol/L — ABNORMAL LOW (ref 134–144)
Total Protein: 7.4 g/dL (ref 6.0–8.5)
eGFR: 97 mL/min/{1.73_m2} (ref >59)

## 2021-04-24 LAB — AMYLASE: Amylase: 60 U/L (ref 31–110)

## 2021-04-24 LAB — IRON,TIBC AND FERRITIN PANEL
Ferritin: 65 ng/mL (ref 15–150)
Iron Saturation: 16 % (ref 15–55)
Iron: 50 ug/dL (ref 27–139)
Total Iron Binding Capacity: 304 ug/dL (ref 250–450)
UIBC: 254 ug/dL (ref 118–369)

## 2021-04-24 LAB — LIPASE: Lipase: 28 U/L (ref 14–72)

## 2021-04-24 LAB — TSH: TSH: 3.48 u[IU]/mL (ref 0.450–4.500)

## 2021-04-24 LAB — MAGNESIUM: Magnesium: 2.1 mg/dL (ref 1.6–2.3)

## 2021-04-24 LAB — VITAMIN B12: Vitamin B-12: 1102 pg/mL (ref 232–1245)

## 2021-04-24 NOTE — Progress Notes (Signed)
Contacted via MyChart   Good morning Bahar your labs have returned.  Overall they look normal with exception of sodium (salt) level which is a little on lower side.  Are you taking your salt tablet daily?  If so continue this and ensure to add a little salt to diet.  Sometimes medications like Effexor can lower this level, so we need to ensure to add it in diet.  Everything else is stable, including pancreas labs.  We will see what head CT shows once completed.  Any questions? Keep being awesome!!  Thank you for allowing me to participate in your care.  I appreciate you. Kindest regards, Tamecka Milham

## 2021-05-16 ENCOUNTER — Ambulatory Visit: Payer: 59

## 2021-06-04 ENCOUNTER — Ambulatory Visit: Payer: 59 | Admitting: Nurse Practitioner

## 2021-11-02 ENCOUNTER — Other Ambulatory Visit: Payer: Self-pay | Admitting: Nurse Practitioner

## 2021-11-02 NOTE — Telephone Encounter (Signed)
Requested medication (s) are due for refill today: yes  Requested medication (s) are on the active medication list: yes  Last refill:  10/11/20 #180/4 RF  Future visit scheduled: no  Notes to clinic:  pt needs appt and updated labs. Pt called, and left VM to call office to schedule appt.      Requested Prescriptions  Pending Prescriptions Disp Refills   venlafaxine (EFFEXOR) 25 MG tablet [Pharmacy Med Name: VENLAFAXINE HCL 25 MG TAB] 180 tablet 4    Sig: TAKE 2 TABLETS BY MOUTH ONCE DAILY     Psychiatry: Antidepressants - SNRI - desvenlafaxine & venlafaxine Failed - 11/02/2021  3:42 PM      Failed - LDL in normal range and within 360 days    LDL Chol Calc (NIH)  Date Value Ref Range Status  02/02/2020 126 (H) 0 - 99 mg/dL Final          Failed - Total Cholesterol in normal range and within 360 days    Cholesterol, Total  Date Value Ref Range Status  02/02/2020 232 (H) 100 - 199 mg/dL Final          Failed - Triglycerides in normal range and within 360 days    Triglycerides  Date Value Ref Range Status  02/02/2020 76 0 - 149 mg/dL Final          Failed - Valid encounter within last 6 months    Recent Outpatient Visits           6 months ago Depression, major, single episode, mild (HCC)   [facility] [place], Jolene T, NP   1 year ago Depression, major, single episode, mild (HCC)   [facility] [place], Jolene T, NP   1 year ago Cellulitis of toe of left foot   [facility] [place], Atkins T, NP   1 year ago Chronic fatigue   [facility] [place], [place] T, NP   1 year ago Depression, major, single episode, mild (HCC)   [facility] [place], [place] T, NP              Passed - Completed PHQ-2 or PHQ-9 in the last 360 days      Passed - Last BP in normal range    BP Readings from Last 1 Encounters:  04/23/21 111/71

## 2021-11-02 NOTE — Telephone Encounter (Signed)
Pt called, left VM to call office back and schedule appt for med refill.

## 2021-11-06 NOTE — Telephone Encounter (Signed)
Due for follow up appointment. Please call to schedule.

## 2021-11-06 NOTE — Telephone Encounter (Signed)
Pt is scheduled for Feb 1st, pt's insurance does not start until then. Pt is asking for a refill until then, states she is out of medication

## 2021-12-12 ENCOUNTER — Ambulatory Visit: Payer: Self-pay | Admitting: Nurse Practitioner

## 2021-12-12 DIAGNOSIS — R7989 Other specified abnormal findings of blood chemistry: Secondary | ICD-10-CM

## 2021-12-12 DIAGNOSIS — E871 Hypo-osmolality and hyponatremia: Secondary | ICD-10-CM

## 2021-12-12 DIAGNOSIS — E559 Vitamin D deficiency, unspecified: Secondary | ICD-10-CM

## 2021-12-12 DIAGNOSIS — E7800 Pure hypercholesterolemia, unspecified: Secondary | ICD-10-CM

## 2021-12-12 DIAGNOSIS — F320 Major depressive disorder, single episode, mild: Secondary | ICD-10-CM

## 2021-12-12 DIAGNOSIS — E538 Deficiency of other specified B group vitamins: Secondary | ICD-10-CM

## 2021-12-12 DIAGNOSIS — F1011 Alcohol abuse, in remission: Secondary | ICD-10-CM

## 2021-12-12 DIAGNOSIS — R519 Headache, unspecified: Secondary | ICD-10-CM

## 2021-12-23 NOTE — Patient Instructions (Incomplete)

## 2021-12-28 ENCOUNTER — Ambulatory Visit: Payer: Self-pay | Admitting: Nurse Practitioner

## 2022-01-20 NOTE — Patient Instructions (Incomplete)
Vitamin B12 Deficiency ?Vitamin B12 deficiency occurs when the body does not have enough vitamin B12, which is an important vitamin. The body needs this vitamin: ?To make red blood cells. ?To make DNA. This is the genetic material inside cells. ?To help the nerves work properly so they can carry messages from the brain to the body. ?Vitamin B12 deficiency can cause various health problems, such as a low red blood cell count (anemia) or nerve damage. ?What are the causes? ?This condition may be caused by: ?Not eating enough foods that contain vitamin B12. ?Not having enough stomach acid and digestive fluids to properly absorb vitamin B12 from the food that you eat. ?Certain digestive system diseases that make it hard to absorb vitamin B12. These diseases include Crohn's disease, chronic pancreatitis, and cystic fibrosis. ?A condition in which the body does not make enough of a protein (intrinsic factor), resulting in too few red blood cells (pernicious anemia). ?Having a surgery in which part of the stomach or small intestine is removed. ?Taking certain medicines that make it hard for the body to absorb vitamin B12. These medicines include: ?Heartburn medicines (antacids and proton pump inhibitors). ?Certain antibiotic medicines. ?Some medicines that are used to treat diabetes, tuberculosis, gout, or high cholesterol. ?What increases the risk? ?The following factors may make you more likely to develop a B12 deficiency: ?Being older than age 50. ?Eating a vegetarian or vegan diet, especially while you are pregnant. ?Eating a poor diet while you are pregnant. ?Taking certain medicines. ?Having alcoholism. ?What are the signs or symptoms? ?In some cases, there are no symptoms of this condition. If the condition leads to anemia or nerve damage, various symptoms can occur, such as: ?Weakness. ?Fatigue. ?Loss of appetite. ?Weight loss. ?Numbness or tingling in your hands and feet. ?Redness and burning of the  tongue. ?Confusion or memory problems. ?Depression. ?Sensory problems, such as color blindness, ringing in the ears, or loss of taste. ?Diarrhea or constipation. ?Trouble walking. ?If anemia is severe, symptoms can include: ?Shortness of breath. ?Dizziness. ?Rapid heart rate (tachycardia). ?How is this diagnosed? ?This condition may be diagnosed with a blood test to measure the level of vitamin B12 in your blood. You may also have other tests, including: ?A group of tests that measure certain characteristics of blood cells (complete blood count, CBC). ?A blood test to measure intrinsic factor. ?A procedure where a thin tube with a camera on the end is used to look into your stomach or intestines (endoscopy). ?Other tests may be needed to discover the cause of B12 deficiency. ?How is this treated? ?Treatment for this condition depends on the cause. This condition may be treated by: ?Changing your eating and drinking habits, such as: ?Eating more foods that contain vitamin B12. ?Drinking less alcohol or no alcohol. ?Getting vitamin B12 injections. ?Taking vitamin B12 supplements. Your health care provider will tell you which dosage is best for you. ?Follow these instructions at home: ?Eating and drinking ? ?Eat lots of healthy foods that contain vitamin B12, including: ?Meats and poultry. This includes beef, pork, chicken, turkey, and organ meats, such as liver. ?Seafood. This includes clams, rainbow trout, salmon, tuna, and haddock. ?Eggs. ?Cereal and dairy products that are fortified. This means that vitamin B12 has been added to the food. Check the label on the package to see if the food is fortified. ?The items listed above may not be a complete list of recommended foods and beverages. Contact a dietitian for more information. ?General instructions ?Get any   injections that are prescribed by your health care provider. ?Take supplements only as told by your health care provider. Follow the directions carefully. ?Do  not drink alcohol if your health care provider tells you not to. In some cases, you may only be asked to limit alcohol use. ?Keep all follow-up visits as told by your health care provider. This is important. ?Contact a health care provider if: ?Your symptoms come back. ?Get help right away if you: ?Develop shortness of breath. ?Have a rapid heart rate. ?Have chest pain. ?Become dizzy or lose consciousness. ?Summary ?Vitamin B12 deficiency occurs when the body does not have enough vitamin B12. ?The main causes of vitamin B12 deficiency include dietary deficiency, digestive diseases, pernicious anemia, and having a surgery in which part of the stomach or small intestine is removed. ?In some cases, there are no symptoms of this condition. If the condition leads to anemia or nerve damage, various symptoms can occur, such as weakness, shortness of breath, and numbness. ?Treatment may include getting vitamin B12 injections or taking vitamin B12 supplements. Eat lots of healthy foods that contain vitamin B12. ?This information is not intended to replace advice given to you by your health care provider. Make sure you discuss any questions you have with your health care provider. ?Document Revised: 04/25/2021 Document Reviewed: 07/07/2018 ?Elsevier Patient Education ? 2022 Elsevier Inc. ? ?

## 2022-01-24 ENCOUNTER — Ambulatory Visit: Payer: Self-pay | Admitting: Nurse Practitioner

## 2022-10-22 ENCOUNTER — Other Ambulatory Visit: Payer: Self-pay | Admitting: Nurse Practitioner

## 2022-10-22 NOTE — Telephone Encounter (Signed)
Requested medication (s) are due for refill today: Yes  Requested medication (s) are on the active medication list: Yes  Last refill:  11/06/21  Future visit scheduled: No  Notes to clinic:  Left message to call and make appointment.    Requested Prescriptions  Pending Prescriptions Disp Refills   venlafaxine (EFFEXOR) 25 MG tablet [Pharmacy Med Name: VENLAFAXINE HCL 25 MG TAB] 180 tablet 4    Sig: TAKE 2 TABLETS BY MOUTH ONCE DAILY     Psychiatry: Antidepressants - SNRI - desvenlafaxine & venlafaxine Failed - 10/22/2022 10:58 AM      Failed - Cr in normal range and within 360 days    Creatinine  Date Value Ref Range Status  11/22/2014 0.54 (L) 0.60 - 1.30 mg/dL Final   Creatinine, Ser  Date Value Ref Range Status  04/23/2021 0.66 0.57 - 1.00 mg/dL Final         Failed - Completed PHQ-2 or PHQ-9 in the last 360 days      Failed - Valid encounter within last 6 months    Recent Outpatient Visits           1 year ago Depression, major, single episode, mild (HCC)   [facility] [place], [place] T, NP   2 years ago Depression, major, single episode, mild (HCC)   [facility] [place], [place] T, NP   2 years ago Cellulitis of toe of left foot   [facility] [place], [place] T, NP   2 years ago Chronic fatigue   [facility] [place], [place] T, NP   2 years ago Depression, major, single episode, mild (HCC)   [facility] [place], Ewen T, NP              Failed - Lipid Panel in normal range within the last 12 months    Cholesterol, Total  Date Value Ref Range Status  02/02/2020 232 (H) 100 - 199 mg/dL Final   LDL Chol Calc (NIH)  Date Value Ref Range Status  02/02/2020 126 (H) 0 - 99 mg/dL Final   HDL  Date Value Ref Range Status  02/02/2020 93 >39 mg/dL Final   Triglycerides  Date Value Ref Range Status  02/02/2020 76 0 - 149 mg/dL Final         Passed - Last BP in normal range    BP Readings  from Last 1 Encounters:  04/23/21 111/71

## 2022-10-24 NOTE — Telephone Encounter (Signed)
LVM asking patient to call back to schedule an appointment 

## 2022-10-25 ENCOUNTER — Encounter: Payer: Self-pay | Admitting: Nurse Practitioner

## 2023-01-23 ENCOUNTER — Encounter: Payer: Self-pay | Admitting: Nurse Practitioner

## 2023-01-23 ENCOUNTER — Ambulatory Visit (INDEPENDENT_AMBULATORY_CARE_PROVIDER_SITE_OTHER): Payer: Medicare Other | Admitting: Nurse Practitioner

## 2023-01-23 VITALS — BP 122/78 | HR 67 | Temp 97.8°F | Ht 64.02 in | Wt 112.5 lb

## 2023-01-23 DIAGNOSIS — R946 Abnormal results of thyroid function studies: Secondary | ICD-10-CM

## 2023-01-23 DIAGNOSIS — R7989 Other specified abnormal findings of blood chemistry: Secondary | ICD-10-CM | POA: Diagnosis not present

## 2023-01-23 DIAGNOSIS — Z23 Encounter for immunization: Secondary | ICD-10-CM

## 2023-01-23 DIAGNOSIS — R5382 Chronic fatigue, unspecified: Secondary | ICD-10-CM

## 2023-01-23 DIAGNOSIS — E7800 Pure hypercholesterolemia, unspecified: Secondary | ICD-10-CM

## 2023-01-23 DIAGNOSIS — Z0000 Encounter for general adult medical examination without abnormal findings: Secondary | ICD-10-CM

## 2023-01-23 DIAGNOSIS — G44229 Chronic tension-type headache, not intractable: Secondary | ICD-10-CM

## 2023-01-23 DIAGNOSIS — F1011 Alcohol abuse, in remission: Secondary | ICD-10-CM | POA: Diagnosis not present

## 2023-01-23 DIAGNOSIS — E871 Hypo-osmolality and hyponatremia: Secondary | ICD-10-CM

## 2023-01-23 DIAGNOSIS — F320 Major depressive disorder, single episode, mild: Secondary | ICD-10-CM

## 2023-01-23 DIAGNOSIS — E559 Vitamin D deficiency, unspecified: Secondary | ICD-10-CM | POA: Diagnosis not present

## 2023-01-23 DIAGNOSIS — Z1231 Encounter for screening mammogram for malignant neoplasm of breast: Secondary | ICD-10-CM

## 2023-01-23 DIAGNOSIS — Z780 Asymptomatic menopausal state: Secondary | ICD-10-CM

## 2023-01-23 DIAGNOSIS — E538 Deficiency of other specified B group vitamins: Secondary | ICD-10-CM

## 2023-01-23 DIAGNOSIS — Z800 Family history of malignant neoplasm of digestive organs: Secondary | ICD-10-CM

## 2023-01-23 DIAGNOSIS — Z1211 Encounter for screening for malignant neoplasm of colon: Secondary | ICD-10-CM

## 2023-01-23 MED ORDER — ONDANSETRON HCL 4 MG PO TABS: 4.0000 mg | ORAL_TABLET | Freq: Three times a day (TID) | ORAL | 0 refills | Status: DC | PRN

## 2023-01-23 MED ORDER — AMITRIPTYLINE HCL 10 MG PO TABS: 10.0000 mg | ORAL_TABLET | Freq: Every day | ORAL | 4 refills | Status: DC
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
## 2023-01-23 NOTE — Assessment & Plan Note (Signed)
Ongoing, all labs in past reassuring.  Currently include fatigue and headaches continue.  Unknown whether related to fatigue or separate issue.  Will obtain labs today to include ANA, Lyme testing, ESR, CRP, CMP, TSH.  Will determine next steps upon return of labs. She has agreed to colonoscopy screening, which would be beneficial.

## 2023-01-23 NOTE — Assessment & Plan Note (Signed)
Chronic since 2016-2017, at time she was heavily drinking alcohol.  Has now been in remission for years and levels occasionally still low, last 131.  Will recheck today.  Continue daily supplement.  Monitor closely and avoid medications which may exacerbate this.

## 2023-01-23 NOTE — Assessment & Plan Note (Signed)
In mother -- check lipase and amylase today.

## 2023-01-23 NOTE — Assessment & Plan Note (Signed)
Noted on past labs, has ongoing fatigue. Recheck TSH, Free T4, and thyroid antibody today.  May benefit from thyroid ultrasound in future if fatigue continues.

## 2023-01-23 NOTE — Assessment & Plan Note (Signed)
Noted on past labs, focusing on diet at home.  Current ASCVD 5.7%.  Recheck levels today.

## 2023-01-23 NOTE — Assessment & Plan Note (Signed)
Chronic, stable.  Has been in remission for >7 years.  Continue to monitor closely for relapse and encourage continued cessation.  H/O heavy alcohol use with suicidal ideation.

## 2023-01-23 NOTE — Patient Instructions (Signed)
Please call to schedule your mammogram and/or bone density: [facility] at [place]: [address], [place], [place] [address] Phone: [phone]  [place] at [facility] [address] [place],  [place]  [address] Phone: [phone]    Migraine Headache A migraine headache is a very strong throbbing pain on one or both sides of your head. This type of headache can also cause other symptoms. It can last from 4 hours to 3 days. Talk with your doctor about what things may bring on (trigger) this condition. What are the causes? The exact cause of a migraine is not known. This condition may be brought on or caused by: Smoking. Medicines, such as: Medicine used to treat chest pain (nitroglycerin). Birth control pills. Estrogen. Some blood pressure medicines. Certain substances in some foods or drinks. Foods and drinks, such as: Cheese. Chocolate. Alcohol. Caffeine. Doing physical activity that is very hard. Other things that may trigger a migraine headache include: Periods. Pregnancy. Hunger. Stress. Getting too much or too little sleep. Weather changes. Feeling tired (fatigue). What increases the risk? Being 21-45 years old. Being female. Having a family history of migraine headaches. Being Caucasian. Having a mental health condition, such as being sad (depressed) or feeling worried or nervous (anxious). Being very overweight (obese). What are the signs or symptoms? A throbbing pain. This pain may: Happen in any area of the head, such as on one or both sides. Make it hard to do daily activities. Get worse with physical activity. Get worse around bright lights, loud noises, or smells. Other symptoms may include: Feeling like you may vomit (nauseous). Vomiting. Dizziness. Before a migraine headache starts, you may get warning signs (an aura). An aura may include: Seeing flashing lights or having blind  spots. Seeing bright spots, halos, or zigzag lines. Having tunnel vision or blurred vision. Having numbness or a tingling feeling. Having trouble talking. Having weak muscles. After a migraine ends, you may have symptoms. These may include: Tiredness. Trouble thinking (concentrating). How is this treated? Taking medicines that: Relieve pain. Relieve the feeling like you may vomit. Prevent migraine headaches. Treatment may also include: Acupuncture. Lifestyle changes like avoiding foods that bring on migraine headaches. Learning ways to control your body functions (biofeedback). Therapy to help you know and deal with negative thoughts (cognitive behavioral therapy). Follow these instructions at home: Medicines Take over-the-counter and prescription medicines only as told by your doctor. If told, take steps to prevent problems with pooping (constipation). You may need to: Drink enough fluid to keep your pee (urine) pale yellow. Take medicines. You will be told what medicines to take. Eat foods that are high in fiber. These include beans, whole grains, and fresh fruits and vegetables. Limit foods that are high in fat and sugar. These include fried or sweet foods. Ask your doctor if you should avoid driving or using machines while you are taking your medicine. Lifestyle  Do not drink alcohol. Do not smoke or use any products that contain nicotine or tobacco. If you need help quitting, ask your doctor. Get 7-9 hours of sleep each night, or the amount recommended by your doctor. Find ways to deal with stress, such as meditation, deep breathing, or yoga. Try to exercise often. This can help lessen how bad and how often your migraines happen. General instructions Keep a journal to find out what may bring on your migraine headaches. This can help you avoid those things. For example,  write down: What you eat and drink. How much sleep you get. Any change to your medicines or diet. If you  have a migraine headache: Avoid things that make your symptoms worse, such as bright lights. Lie down in a dark, quiet room. Do not drive or use machinery. Ask your doctor what activities are safe for you. Where to find more information Coalition for Headache and Migraine Patients (CHAMP): headachemigraine.org American Migraine Foundation: americanmigrainefoundation.org National Headache Foundation: headaches.org Contact a doctor if: You get a migraine headache that is different or worse than others you have had. You have more than 15 days of headaches in one month. Get help right away if: Your migraine headache gets very bad. Your migraine headache lasts more than 72 hours. You have a fever or stiff neck. You have trouble seeing. Your muscles feel weak or like you cannot control them. You lose your balance a lot. You have trouble walking. You faint. You have a seizure. This information is not intended to replace advice given to you by your health care provider. Make sure you discuss any questions you have with your health care provider. Document Revised: 06/24/2022 Document Reviewed: 06/24/2022 Elsevier Patient Education  [place].

## 2023-01-23 NOTE — Assessment & Plan Note (Signed)
Chronic and ongoing.  Restart Amitriptyline to assess if benefit with this and send in Zofran for nausea.  Will obtain imaging to further assess due to ongoing nature of these headaches and chronic fatigue.

## 2023-01-23 NOTE — Assessment & Plan Note (Signed)
Chronic, ongoing. Taking supplement at home.  Check level today and adjust as needed.

## 2023-01-23 NOTE — Assessment & Plan Note (Signed)
Chronic, stable with current Effexor dose.  Continue current medication regimen and adjust as needed.  Denies SI/HI.   Her symptoms become worse seasonally, have recommended adding on Wellbutrin which may offer benefit to her fatigue and seasonal mood changes, wishes to hold off at this time.

## 2023-01-24 NOTE — Progress Notes (Signed)
Contacted via MyChart The 10-year ASCVD risk score (Arnett DK, et al., 2019) is: 5.6%   Values used to calculate the score:     Age: 67 years     Sex: Female     Is Non-Hispanic African American: No     Diabetic: No     Tobacco smoker: No     Systolic Blood Pressure: 123XX123 mmHg     Is BP treated: No     HDL Cholesterol: 94 mg/dL     Total Cholesterol: 233 mg/dL   Good evening Patty Cruz, your labs are starting to return, I will review those that have: - Kidney function, creatinine and eGFR, remains normal, as is liver function, AST and ALT.  - Sodium level is normal!!  Wonderful!! - B12 level above normal, if taking supplement I would cut back to every other day on taking this.  Vitamin D level normal, continue supplement. - Thyroid labs , TSH, Free T4, and antibody are all normal. - Inflammatory markers = ESR and CRP are normal - Pancreas labs = lipase and amylase, are normal - CBC shows no infection or anemia. - Cholesterol labs are elevated, but no medications needed at this time.  Focus on diet and regular activity. - Waiting on Lyme testing and ANA.  Any questions? Keep being amazing!!  Thank you for allowing me to participate in your care.  I appreciate you. Kindest regards, Patty Cruz

## 2023-01-27 ENCOUNTER — Telehealth: Payer: Self-pay | Admitting: *Deleted

## 2023-01-27 ENCOUNTER — Other Ambulatory Visit: Payer: Self-pay | Admitting: *Deleted

## 2023-01-27 ENCOUNTER — Telehealth: Payer: 59

## 2023-01-27 DIAGNOSIS — Z1211 Encounter for screening for malignant neoplasm of colon: Secondary | ICD-10-CM

## 2023-01-27 MED ORDER — NA SULFATE-K SULFATE-MG SULF 17.5-3.13-1.6 GM/177ML PO SOLN: 1.0000 | Freq: Once | ORAL | 0 refills | Status: AC

## 2023-01-27 NOTE — Telephone Encounter (Signed)
Gastroenterology Pre-Procedure Review  Request Date: 02/17/2023 Requesting Physician: Dr. Vicente Males  PATIENT REVIEW QUESTIONS: The patient responded to the following health history questions as indicated:    1. Are you having any GI issues? no 2. Do you have a personal history of Polyps? no 3. Do you have a family history of Colon Cancer or Polyps? no 4. Diabetes Mellitus? no 5. Joint replacements in the past 12 months?no 6. Major health problems in the past 3 months?no 7. Any artificial heart valves, MVP, or defibrillator?no    MEDICATIONS & ALLERGIES:    Patient reports the following regarding taking any anticoagulation/antiplatelet therapy:   Plavix, Coumadin, Eliquis, Xarelto, Lovenox, Pradaxa, Brilinta, or Effient? no Aspirin? no  Patient confirms/reports the following medications:  Current Outpatient Medications  Medication Sig Dispense Refill   amitriptyline (ELAVIL) 10 MG tablet Take 1 tablet (10 mg total) by mouth at bedtime. 30 tablet 4   Ascorbic Acid (VITAMIN C PO) Take by mouth daily.     Cyanocobalamin (VITAMIN B 12 PO) Take by mouth daily.     Ferrous Sulfate (IRON PO) Take by mouth daily.     Multiple Vitamins-Minerals (TGT MULTIVITAMIN/MULTIMINERAL) TABS      ondansetron (ZOFRAN) 4 MG tablet Take 1 tablet (4 mg total) by mouth every 8 (eight) hours as needed for nausea or vomiting. 20 tablet 0   sodium chloride 1 g tablet Take 1 g by mouth daily.     venlafaxine (EFFEXOR) 25 MG tablet TAKE 2 TABLETS BY MOUTH ONCE DAILY 60 tablet 0   VITAMIN D PO Take by mouth daily.     No current facility-administered medications for this visit.    Patient confirms/reports the following allergies:  Allergies  Allergen Reactions   Tramadol Nausea Only   Trazodone And Nefazodone Nausea And Vomiting    No orders of the defined types were placed in this encounter.   AUTHORIZATION INFORMATION Primary Insurance: 1D#: Group #:  Secondary Insurance: 1D#: Group #:  SCHEDULE  INFORMATION: Date: 02/17/2023 Time: Location: [place]

## 2023-01-27 NOTE — Telephone Encounter (Signed)
Patient calling to schedule colonoscopy.  

## 2023-01-27 NOTE — Telephone Encounter (Signed)
Message left for patient to return my call.  

## 2023-01-28 NOTE — Telephone Encounter (Signed)
Colonoscopy schedule on 02/17/2023

## 2023-01-29 ENCOUNTER — Ambulatory Visit
Admission: RE | Admit: 2023-01-29 | Discharge: 2023-01-29 | Disposition: A | Discharge status: Home / Self Care | Payer: Medicare Other | Source: Ambulatory Visit | Attending: Nurse Practitioner | Admitting: Nurse Practitioner

## 2023-01-29 DIAGNOSIS — G44229 Chronic tension-type headache, not intractable: Secondary | ICD-10-CM | POA: Insufficient documentation

## 2023-01-29 DIAGNOSIS — R519 Headache, unspecified: Secondary | ICD-10-CM | POA: Diagnosis not present

## 2023-01-29 MED ORDER — IOHEXOL 300 MG/ML  SOLN
75.0000 mL | Freq: Once | INTRAMUSCULAR | Status: AC | PRN
  Administered 2023-01-29: 75 mL via INTRAVENOUS

## 2023-02-01 ENCOUNTER — Other Ambulatory Visit: Payer: Self-pay | Admitting: Nurse Practitioner

## 2023-02-01 DIAGNOSIS — R768 Other specified abnormal immunological findings in serum: Secondary | ICD-10-CM

## 2023-02-01 NOTE — Progress Notes (Signed)
Contacted via MyChart   Good morning Patty Cruz, your head imaging returned and is normal.  No acute findings.  Good news!!  Your ANA is positive on labs though, this could mean an autoimmune disease.  I am placing a referral to rheumatology to further assess this, at times we have false positive testing and they would be able to assist [place] further on testing and recommendations.  However, if autoimmune disease present this could explain some of your ongoing symptoms that you have had for years.  Any questions?   Keep being awesome!!  Thank you for allowing me to participate in your care.  I appreciate you. Kindest regards, Zamar Odwyer

## 2023-02-03 ENCOUNTER — Telehealth: Payer: Self-pay | Admitting: Nurse Practitioner

## 2023-02-03 NOTE — Telephone Encounter (Signed)
Clarise Cruz could we check on this please and thank you

## 2023-02-03 NOTE — Telephone Encounter (Unsigned)
Copied from [place] [id number]. Topic: Referral - Status >> Feb 03, 2023  4:14 PM Ludger Nutting wrote: Patient states that Dr. Posey Pronto with Rheumatology did not receive referral. Can referral be resent?

## 2023-02-09 LAB — LIPID PANEL W/O CHOL/HDL RATIO
Cholesterol, Total: 233 mg/dL — ABNORMAL HIGH (ref 100–199)
HDL: 94 mg/dL (ref >39)
LDL Chol Calc (NIH): 127 mg/dL — ABNORMAL HIGH (ref 0–99)
Triglycerides: 68 mg/dL (ref 0–149)
VLDL Cholesterol Cal: 12 mg/dL (ref 5–40)

## 2023-02-09 LAB — CBC WITH DIFFERENTIAL/PLATELET
Basophils Absolute: 0.1 10*3/uL (ref 0.0–0.2)
Basos: 1 %
EOS (ABSOLUTE): 0.2 10*3/uL (ref 0.0–0.4)
Eos: 3 %
Hematocrit: 39.8 % (ref 34.0–46.6)
Hemoglobin: 13.3 g/dL (ref 11.1–15.9)
Immature Grans (Abs): 0 10*3/uL (ref 0.0–0.1)
Immature Granulocytes: 0 %
Lymphocytes Absolute: 1.1 10*3/uL (ref 0.7–3.1)
Lymphs: 16 %
MCH: 31.6 pg (ref 26.6–33.0)
MCHC: 33.4 g/dL (ref 31.5–35.7)
MCV: 95 fL (ref 79–97)
Monocytes Absolute: 0.5 10*3/uL (ref 0.1–0.9)
Monocytes: 8 %
Neutrophils Absolute: 5.1 10*3/uL (ref 1.4–7.0)
Neutrophils: 72 %
Platelets: 344 10*3/uL (ref 150–450)
RBC: 4.21 x10E6/uL (ref 3.77–5.28)
RDW: 11.8 % (ref 11.7–15.4)
WBC: 7 10*3/uL (ref 3.4–10.8)

## 2023-02-09 LAB — COMPREHENSIVE METABOLIC PANEL
ALT: 25 IU/L (ref 0–32)
AST: 32 IU/L (ref 0–40)
Albumin/Globulin Ratio: 2 (ref 1.2–2.2)
Albumin: 5.1 g/dL — ABNORMAL HIGH (ref 3.9–4.9)
Alkaline Phosphatase: 118 IU/L (ref 44–121)
BUN/Creatinine Ratio: 17 (ref 12–28)
BUN: 11 mg/dL (ref 8–27)
Bilirubin Total: 0.3 mg/dL (ref 0.0–1.2)
CO2: 22 mmol/L (ref 20–29)
Calcium: 9.7 mg/dL (ref 8.7–10.3)
Chloride: 96 mmol/L (ref 96–106)
Creatinine, Ser: 0.63 mg/dL (ref 0.57–1.00)
Globulin, Total: 2.5 g/dL (ref 1.5–4.5)
Glucose: 89 mg/dL (ref 70–99)
Potassium: 4.3 mmol/L (ref 3.5–5.2)
Sodium: 136 mmol/L (ref 134–144)
Total Protein: 7.6 g/dL (ref 6.0–8.5)
eGFR: 97 mL/min/{1.73_m2} (ref >59)

## 2023-02-09 LAB — LYME DISEASE, WESTERN BLOT
IgG P18 Ab.: ABSENT
IgG P23 Ab.: ABSENT
IgG P28 Ab.: ABSENT
IgG P30 Ab.: ABSENT
IgG P39 Ab.: ABSENT
IgG P58 Ab.: ABSENT
IgG P66 Ab.: ABSENT
IgG P93 Ab.: ABSENT
IgM P23 Ab.: ABSENT
IgM P39 Ab.: ABSENT
IgM P41 Ab.: ABSENT
Lyme IgG Wb: NEGATIVE
Lyme IgM Wb: NEGATIVE

## 2023-02-09 LAB — ANA 12 PLUS PROFILE, POSITIVE
Anti-CCP Ab, IgG & IgA (RDL): <20 Units (ref <20)
Anti-Cardiolipin Ab, IgA (RDL): <12 APL U/mL (ref <12)
Anti-Cardiolipin Ab, IgG (RDL): <15 GPL U/mL (ref <15)
Anti-Cardiolipin Ab, IgM (RDL): <13 MPL U/mL (ref <13)
Anti-Centromere Ab (RDL): <1:40 {titer}
Anti-Chromatin Ab, IgG (RDL): <20 Units (ref <20)
Anti-La (SS-B) Ab (RDL): <20 Units (ref <20)
Anti-Ro (SS-A) Ab (RDL): <20 Units (ref <20)
Anti-Scl-70 Ab (RDL): <20 Units (ref <20)
Anti-Sm Ab (RDL): <20 Units (ref <20)
Anti-U1 RNP Ab (RDL): <20 Units (ref <20)
Anti-dsDNA Ab by Farr(RDL): <8 IU/mL (ref <8.0)
C3 Complement (RDL): 120 mg/dL (ref 82–167)
C4 Complement (RDL): 17 mg/dL (ref 14–44)
Rheumatoid Factor by Turb RDL: <14 IU/mL (ref <14)
Speckled Pattern: 1:160 {titer} — ABNORMAL HIGH

## 2023-02-09 LAB — THYROID PEROXIDASE ANTIBODY: Thyroperoxidase Ab SerPl-aCnc: 12 IU/mL (ref 0–34)

## 2023-02-09 LAB — T4, FREE: Free T4: 1.08 ng/dL (ref 0.82–1.77)

## 2023-02-09 LAB — TSH: TSH: 4.17 u[IU]/mL (ref 0.450–4.500)

## 2023-02-09 LAB — LIPASE: Lipase: 29 U/L (ref 14–72)

## 2023-02-09 LAB — AMYLASE: Amylase: 65 U/L (ref 31–110)

## 2023-02-09 LAB — C-REACTIVE PROTEIN: CRP: <1 mg/L (ref 0–10)

## 2023-02-09 LAB — MAGNESIUM: Magnesium: 2.2 mg/dL (ref 1.6–2.3)

## 2023-02-09 LAB — VITAMIN D 25 HYDROXY (VIT D DEFICIENCY, FRACTURES): Vit D, 25-Hydroxy: 51.5 ng/mL (ref 30.0–100.0)

## 2023-02-09 LAB — VITAMIN B12: Vitamin B-12: >2000 pg/mL — ABNORMAL HIGH (ref 232–1245)

## 2023-02-09 LAB — ANA 12 PLUS PROFILE (RDL): Anti-Nuclear Ab by IFA (RDL): POSITIVE — AB

## 2023-02-09 LAB — SEDIMENTATION RATE: Sed Rate: 8 mm/hr (ref 0–40)

## 2023-02-14 ENCOUNTER — Encounter: Payer: Self-pay | Admitting: Gastroenterology

## 2023-02-17 ENCOUNTER — Ambulatory Visit: Payer: Medicare Other | Admitting: Certified Registered"

## 2023-02-17 ENCOUNTER — Encounter
Admission: RE | Disposition: A | Discharge status: Home / Self Care | Payer: Self-pay | Source: Home / Self Care | Attending: Gastroenterology

## 2023-02-17 ENCOUNTER — Ambulatory Visit
Admission: RE | Admit: 2023-02-17 | Discharge: 2023-02-17 | Disposition: A | Discharge status: Home / Self Care | Payer: Medicare Other | Attending: Gastroenterology | Admitting: Gastroenterology

## 2023-02-17 DIAGNOSIS — F419 Anxiety disorder, unspecified: Secondary | ICD-10-CM | POA: Insufficient documentation

## 2023-02-17 DIAGNOSIS — K5730 Diverticulosis of large intestine without perforation or abscess without bleeding: Secondary | ICD-10-CM | POA: Diagnosis not present

## 2023-02-17 DIAGNOSIS — Z1211 Encounter for screening for malignant neoplasm of colon: Secondary | ICD-10-CM

## 2023-02-17 DIAGNOSIS — F32A Depression, unspecified: Secondary | ICD-10-CM | POA: Insufficient documentation

## 2023-02-17 HISTORY — PX: COLONOSCOPY WITH PROPOFOL: SHX5780

## 2023-02-17 SURGERY — COLONOSCOPY WITH PROPOFOL
Anesthesia: General

## 2023-02-17 MED ORDER — DEXMEDETOMIDINE HCL IN NACL 80 MCG/20ML IV SOLN
INTRAVENOUS | Status: DC | PRN
  Administered 2023-02-17: 8 ug via BUCCAL

## 2023-02-17 MED ORDER — SIMETHICONE 40 MG/0.6ML PO SUSP
ORAL | Status: DC | PRN
  Administered 2023-02-17: 120 mL

## 2023-02-17 MED ORDER — PROPOFOL 500 MG/50ML IV EMUL
INTRAVENOUS | Status: DC | PRN
  Administered 2023-02-17: 150 ug/kg/min via INTRAVENOUS

## 2023-02-17 MED ORDER — SODIUM CHLORIDE 0.9 % IV SOLN: INTRAVENOUS | Status: DC

## 2023-02-17 MED ORDER — PROPOFOL 10 MG/ML IV BOLUS
INTRAVENOUS | Status: DC | PRN
  Administered 2023-02-17: 40 mg via INTRAVENOUS
  Administered 2023-02-17 (×3): 20 mg via INTRAVENOUS

## 2023-02-17 NOTE — Transfer of Care (Signed)
Immediate Anesthesia Transfer of Care Note  Patient: Patty Cruz  Procedure(s) Performed: COLONOSCOPY WITH PROPOFOL  Patient Location: PACU and Endoscopy Unit  Anesthesia Type:General  Level of Consciousness: sedated  Airway & Oxygen Therapy: Patient Spontanous Breathing  Post-op Assessment: Report given to RN and Post -op Vital signs reviewed and stable  Post vital signs: Reviewed and stable  Last Vitals:  Vitals Value Taken Time  BP 89/56 02/17/23 1003  Temp 36.7 C 02/17/23 1003  Pulse 64 02/17/23 1004  Resp 18 02/17/23 1004  SpO2 100 % 02/17/23 1004  Vitals shown include unvalidated device data.  Last Pain:  Vitals:   02/17/23 1003  TempSrc: Temporal  PainSc: Asleep         Complications: No notable events documented.

## 2023-02-17 NOTE — Anesthesia Preprocedure Evaluation (Addendum)
Anesthesia Evaluation  Patient identified by MRN, date of birth, ID band Patient awake    Reviewed: Allergy & Precautions, NPO status , Patient's Chart, lab work & pertinent test results  History of Anesthesia Complications Negative for: history of anesthetic complications  Airway Mallampati: III  TM Distance: >3 FB Neck ROM: full    Dental  (+) Poor Dentition   Pulmonary neg pulmonary ROS   Pulmonary exam normal        Cardiovascular negative cardio ROS Normal cardiovascular exam     Neuro/Psych  Headaches PSYCHIATRIC DISORDERS Anxiety Depression       GI/Hepatic negative GI ROS, Neg liver ROS,,,  Endo/Other  negative endocrine ROS    Renal/GU negative Renal ROS  negative genitourinary   Musculoskeletal   Abdominal   Peds  Hematology negative hematology ROS (+)   Anesthesia Other Findings Past Medical History: No date: Alcohol abuse 04/02/2016: Alcohol use disorder, severe, dependence 04/02/2016: Alcohol withdrawal 04/02/2016: 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 No date: Anxiety No date: Depression 02/02/2020: Frontal headache No date: Medical history non-contributory  Past Surgical History: No date: CARPAL TUNNEL RELEASE; Right 1991: CESAREAN SECTION No date: NO PAST SURGERIES No date: TONSILLECTOMY No date: WRIST SURGERY; Right  BMI    Body Mass Index: 18.88 kg/m      Reproductive/Obstetrics negative OB ROS                             Anesthesia Physical Anesthesia Plan  ASA: 2  Anesthesia Plan: General   Post-op Pain Management: Minimal or no pain anticipated   Induction: Intravenous  PONV Risk Score and Plan: 2 and Propofol infusion and TIVA  Airway Management Planned: Natural Airway and Nasal Cannula  Additional Equipment:   Intra-op Plan:   Post-operative Plan:   Informed Consent: I have reviewed  the patients History and Physical, chart, labs and discussed the procedure including the risks, benefits and alternatives for the proposed anesthesia with the patient or authorized representative who has indicated his/her understanding and acceptance.     Dental Advisory Given  Plan Discussed with: Anesthesiologist, CRNA and Surgeon  Anesthesia Plan Comments: (Patient consented for risks of anesthesia including but not limited to:  - adverse reactions to medications - risk of airway placement if required - damage to eyes, teeth, lips or other oral mucosa - nerve damage due to positioning  - sore throat or hoarseness - Damage to heart, brain, nerves, lungs, other parts of body or loss of life  Patient voiced understanding.)       Anesthesia Quick Evaluation

## 2023-02-17 NOTE — Anesthesia Postprocedure Evaluation (Signed)
Anesthesia Post Note  Patient: Patty Cruz  Procedure(s) Performed: COLONOSCOPY WITH PROPOFOL  Patient location during evaluation: Endoscopy Anesthesia Type: General Level of consciousness: awake and alert Pain management: pain level controlled Vital Signs Assessment: post-procedure vital signs reviewed and stable Respiratory status: spontaneous breathing, nonlabored ventilation, respiratory function stable and patient connected to nasal cannula oxygen Cardiovascular status: blood pressure returned to baseline and stable Postop Assessment: no apparent nausea or vomiting Anesthetic complications: no   No notable events documented.   Last Vitals:  Vitals:   02/17/23 1013 02/17/23 1023  BP: 91/67 107/67  Pulse: 74 66  Resp: (!) 21 18  Temp:    SpO2: 100% 100%    Last Pain:  Vitals:   02/17/23 1023  TempSrc:   PainSc: 0-No pain                 Louie Boston

## 2023-02-17 NOTE — Op Note (Signed)
[facility] Gastroenterology Patient Name: Patty AngelicaJane Jachim Procedure Date: 02/17/2023 9:28 AM MRN: [id number] Account #: [id number] Date of Birth: April 25, 1956 Admit Type: Outpatient Age: 67 [facility]OM 1 Gender: Female Note Status: Finalized Instrument Name: Prentice DockerColonoscope [id number] Procedure:             Colonoscopy Indications:           Screening for colorectal malignant neoplasm Providers:             Wyline MoodKiran Zaina Jenkin MD, MD Medicines:             Monitored Anesthesia Care Complications:         No immediate complications. Procedure:             Pre-Anesthesia Assessment:                        - Prior to the procedure, a History and Physical was                         performed, and patient medications, allergies and                         sensitivities were reviewed. The patient's tolerance                         of previous anesthesia was reviewed.                        - The risks and benefits of the procedure and the                         sedation options and risks were discussed with the                         patient. All questions were answered and informed                         consent was obtained.                        - ASA Grade Assessment: II - A patient with mild                         systemic disease.                        After obtaining informed consent, the colonoscope was                         passed under direct vision. Throughout the procedure,                         the patient's blood pressure, pulse, and oxygen                         saturations were monitored continuously. The                         Colonoscope was introduced through the anus and  advanced to the the cecum, identified by the                         appendiceal orifice. The colonoscopy was performed                         with ease. The patient tolerated the procedure well.                         The quality of the bowel preparation  was excellent.                         The ileocecal valve, appendiceal orifice, and rectum                         were photographed. Findings:      The perianal and digital rectal examinations were normal.      Multiple small-mouthed diverticula were found in the sigmoid colon.      The exam was otherwise without abnormality on direct and retroflexion       views. Impression:            - Diverticulosis in the sigmoid colon.                        - The examination was otherwise normal on direct and                         retroflexion views.                        - No specimens collected. Recommendation:        - Discharge patient to home (with escort).                        - Resume previous diet.                        - Continue present medications.                        - Repeat colonoscopy in 10 years for screening                         purposes. Procedure Code(s):     --- Professional ---                        [phone], Colonoscopy, flexible; diagnostic, including                         collection of specimen(s) by brushing or washing, when                         performed (separate procedure) Diagnosis Code(s):     --- Professional ---                        Z12.11, Encounter for screening for malignant neoplasm                         of colon  K57.30, Diverticulosis of large intestine without                         perforation or abscess without bleeding CPT copyright 2022 American Medical Association. All rights reserved. The codes documented in this report are preliminary and upon coder review may  be revised to meet current compliance requirements. Wyline Mood, MD Wyline Mood MD, MD 02/17/2023 10:01:31 AM This report has been signed electronically. Number of Addenda: 0 Note Initiated On: 02/17/2023 9:28 AM Scope Withdrawal Time: 0 hours 13 minutes 50 seconds  Total Procedure Duration: 0 hours 19 minutes 14 seconds  Estimated Blood Loss:   Estimated blood loss: none.      [facility]

## 2023-02-17 NOTE — H&P (Signed)
Wyline Mood, MD [address], [address], [place], [place], [address] [address], [address], [place], [place], [address] Phone: [phone]  Fax: [phone]  Primary Care Physician:  Marjie Skiff, NP   Pre-Procedure History & Physical: HPI:  Patty Cruz is a 67 y.o. female is here for an colonoscopy.   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 Authorizing Provider  venlafaxine (EFFEXOR) 25 MG tablet TAKE 2 TABLETS BY MOUTH ONCE DAILY 10/24/22  Yes Cannady, Jolene T, NP  amitriptyline (ELAVIL) 10 MG tablet Take 1 tablet (10 mg total) by mouth at bedtime. 01/23/23   Cannady, Corrie Dandy T, NP  Ascorbic Acid (VITAMIN C PO) Take by mouth daily.    [provider]  Cyanocobalamin (VITAMIN B 12 PO) Take by mouth daily.    [provider]  Ferrous Sulfate (IRON PO) Take by mouth daily.    [provider]  Multiple Vitamins-Minerals (TGT MULTIVITAMIN/MULTIMINERAL) TABS  09/04/20   [provider]  ondansetron (ZOFRAN) 4 MG tablet Take 1 tablet (4 mg total) by mouth every 8 (eight) hours as needed for nausea or vomiting. 01/23/23   Cannady, Corrie Dandy T, NP  sodium chloride 1 g tablet Take 1 g by mouth daily.    [provider]  VITAMIN D PO Take by mouth daily.    [provider]    Allergies as of 01/28/2023 - Review Complete 01/23/2023  Allergen Reaction Noted   Tramadol Nausea Only  09/29/2013   Trazodone and nefazodone Nausea And Vomiting 12/27/2014    Family History  Problem Relation Age of Onset   Cancer Mother        lung, pancrease, and stomach   Cancer Maternal Grandmother        breast   Breast cancer Maternal Grandmother    Cancer Paternal Grandmother        liver   Alcohol abuse Brother     Social History   Socioeconomic History   Marital status: Divorced    Spouse name: Not on file   Number of children: Not on file   Years of education: Not on file   Highest education level: Not on file  Occupational History   Not on file  Tobacco Use   Smoking status: Never   Smokeless tobacco: Never  Vaping Use   Vaping Use: Never used  Substance and Sexual Activity   Alcohol use: Not Currently   Drug use: No   Sexual activity: Not Currently  Other Topics Concern   Not on file  Social History Narrative   Not on file   Social Determinants of Health   Financial Resource Strain: Low Risk  (01/23/2023)   Overall Financial Resource Strain (CARDIA)    Difficulty of Paying Living Expenses: Not very hard  Food Insecurity: No Food Insecurity (01/23/2023)   Hunger Vital Sign  Worried About Programme researcher, broadcasting/film/video in the Last Year: Never true    Ran Out of Food in the Last Year: Never true  Transportation Needs: No Transportation Needs (01/23/2023)   PRAPARE - Administrator, Civil Service (Medical): No    Lack of Transportation (Non-Medical): No  Physical Activity: Insufficiently Active (01/23/2023)   Exercise Vital Sign    Days of Exercise per Week: 2 days    Minutes of Exercise per Session: 20 min  Stress: Stress Concern Present (01/23/2023)   [employer] of Occupational Health - Occupational Stress Questionnaire    Feeling of Stress : To some extent  Social Connections: Socially Isolated (01/23/2023)   Social Connection and Isolation Panel [NHANES]    Frequency of Communication with Friends and Family: Three times a week    Frequency  of Social Gatherings with Friends and Family: Three times a week    Attends Religious Services: Never    Active Member of Clubs or Organizations: No    Attends Banker Meetings: Never    Marital Status: Divorced  Catering manager Violence: Not At Risk (01/23/2023)   Humiliation, Afraid, Rape, and Kick questionnaire    Fear of Current or Ex-Partner: No    Emotionally Abused: No    Physically Abused: No    Sexually Abused: No    Review of Systems: See HPI, otherwise negative ROS  Physical Exam: BP 113/74   Pulse 96   Temp 98.1 F (36.7 C) (Temporal)   Resp 16   Ht 5\' 4"  (1.626 m)   Wt 49.9 kg   SpO2 100%   BMI 18.88 kg/m  General:   Alert,  pleasant and cooperative in NAD Head:  Normocephalic and atraumatic. Neck:  Supple; no masses or thyromegaly. Lungs:  Clear throughout to auscultation, normal respiratory effort.    Heart:  +S1, +S2, Regular rate and rhythm, No edema. Abdomen:  Soft, nontender and nondistended. Normal bowel sounds, without guarding, and without rebound.   Neurologic:  Alert and  oriented x4;  grossly normal neurologically.  Impression/Plan: Patty Cruz is here for an colonoscopy to be performed for Screening colonoscopy average risk   Risks, benefits, limitations, and alternatives regarding  colonoscopy have been reviewed with the patient.  Questions have been answered.  All parties agreeable.   Wyline Mood, MD  02/17/2023, 8:55 AM

## 2023-02-17 NOTE — Anesthesia Procedure Notes (Signed)
Procedure Name: MAC Date/Time: 02/17/2023 9:36 AM  Performed by: Cheral Bay, CRNAPre-anesthesia Checklist: Patient identified, Emergency Drugs available, Suction available, Patient being monitored and Timeout performed Patient Re-evaluated:Patient Re-evaluated prior to induction Oxygen Delivery Method: Nasal cannula Induction Type: IV induction Placement Confirmation: positive ETCO2 and CO2 detector

## 2023-02-18 ENCOUNTER — Other Ambulatory Visit: Payer: Self-pay | Admitting: Nurse Practitioner

## 2023-02-18 ENCOUNTER — Encounter: Payer: Self-pay | Admitting: Gastroenterology

## 2023-02-19 NOTE — Telephone Encounter (Signed)
Requested Prescriptions  Pending Prescriptions Disp Refills   venlafaxine (EFFEXOR) 25 MG tablet [Pharmacy Med Name: VENLAFAXINE HCL 25 MG TAB] 180 tablet 0    Sig: TAKE 2 TABLETS BY MOUTH ONCE DAILY     Psychiatry: Antidepressants - SNRI - desvenlafaxine & venlafaxine Failed - 02/18/2023  2:42 PM      Failed - Lipid Panel in normal range within the last 12 months    Cholesterol, Total  Date Value Ref Range Status  01/23/2023 233 (H) 100 - 199 mg/dL Final   LDL Chol Calc (NIH)  Date Value Ref Range Status  01/23/2023 127 (H) 0 - 99 mg/dL Final   HDL  Date Value Ref Range Status  01/23/2023 94 >39 mg/dL Final   Triglycerides  Date Value Ref Range Status  01/23/2023 68 0 - 149 mg/dL Final         Passed - Cr in normal range and within 360 days    Creatinine  Date Value Ref Range Status  11/22/2014 0.54 (L) 0.60 - 1.30 mg/dL Final   Creatinine, Ser  Date Value Ref Range Status  01/23/2023 0.63 0.57 - 1.00 mg/dL Final         Passed - Completed PHQ-2 or PHQ-9 in the last 360 days      Passed - Last BP in normal range    BP Readings from Last 1 Encounters:  02/17/23 107/67         Passed - Valid encounter within last 6 months    Recent Outpatient Visits           3 weeks ago Medicare annual wellness visit, initial   [place] [facility] [place], Yaurel T, NP   1 year ago Depression, major, single episode, mild (HCC)   [place] [facility] [place], Corrie Dandy T, NP   2 years ago Depression, major, single episode, mild (HCC)   [place] [facility] [place], [place] T, NP   2 years ago Cellulitis of toe of left foot   [place] [facility] [place], Nokomis T, NP   3 years ago Chronic fatigue   [place] [facility] [place], Dorie Rank, NP       Future Appointments             In 5 days [place], Dorie Rank, NP  [facility], [facility]

## 2023-02-22 NOTE — Patient Instructions (Signed)
Migraine Headache A migraine headache is a very strong throbbing pain on one or both sides of your head. This type of headache can also cause other symptoms. It can last from 4 hours to 3 days. Talk with your doctor about what things may bring on (trigger) this condition. What are the causes? The exact cause of a migraine is not known. This condition may be brought on or caused by: Smoking. Medicines, such as: Medicine used to treat chest pain (nitroglycerin). Birth control pills. Estrogen. Some blood pressure medicines. Certain substances in some foods or drinks. Foods and drinks, such as: Cheese. Chocolate. Alcohol. Caffeine. Doing physical activity that is very hard. Other things that may trigger a migraine headache include: Periods. Pregnancy. Hunger. Stress. Getting too much or too little sleep. Weather changes. Feeling tired (fatigue). What increases the risk? Being 25-55 years old. Being female. Having a family history of migraine headaches. Being Caucasian. Having a mental health condition, such as being sad (depressed) or feeling worried or nervous (anxious). Being very overweight (obese). What are the signs or symptoms? A throbbing pain. This pain may: Happen in any area of the head, such as on one or both sides. Make it hard to do daily activities. Get worse with physical activity. Get worse around bright lights, loud noises, or smells. Other symptoms may include: Feeling like you may vomit (nauseous). Vomiting. Dizziness. Before a migraine headache starts, you may get warning signs (an aura). An aura may include: Seeing flashing lights or having blind spots. Seeing bright spots, halos, or zigzag lines. Having tunnel vision or blurred vision. Having numbness or a tingling feeling. Having trouble talking. Having weak muscles. After a migraine ends, you may have symptoms. These may include: Tiredness. Trouble thinking (concentrating). How is this  treated? Taking medicines that: Relieve pain. Relieve the feeling like you may vomit. Prevent migraine headaches. Treatment may also include: Acupuncture. Lifestyle changes like avoiding foods that bring on migraine headaches. Learning ways to control your body functions (biofeedback). Therapy to help you know and deal with negative thoughts (cognitive behavioral therapy). Follow these instructions at home: Medicines Take over-the-counter and prescription medicines only as told by your doctor. If told, take steps to prevent problems with pooping (constipation). You may need to: Drink enough fluid to keep your pee (urine) pale yellow. Take medicines. You will be told what medicines to take. Eat foods that are high in fiber. These include beans, whole grains, and fresh fruits and vegetables. Limit foods that are high in fat and sugar. These include fried or sweet foods. Ask your doctor if you should avoid driving or using machines while you are taking your medicine. Lifestyle  Do not drink alcohol. Do not smoke or use any products that contain nicotine or tobacco. If you need help quitting, ask your doctor. Get 7-9 hours of sleep each night, or the amount recommended by your doctor. Find ways to deal with stress, such as meditation, deep breathing, or yoga. Try to exercise often. This can help lessen how bad and how often your migraines happen. General instructions Keep a journal to find out what may bring on your migraine headaches. This can help you avoid those things. For example, write down: What you eat and drink. How much sleep you get. Any change to your medicines or diet. If you have a migraine headache: Avoid things that make your symptoms worse, such as bright lights. Lie down in a dark, quiet room. Do not drive or use machinery. Ask your   doctor what activities are safe for you. Where to find more information Coalition for Headache and Migraine Patients (CHAMP):  headachemigraine.org American Migraine Foundation: americanmigrainefoundation.org National Headache Foundation: headaches.org Contact a doctor if: You get a migraine headache that is different or worse than others you have had. You have more than 15 days of headaches in one month. Get help right away if: Your migraine headache gets very bad. Your migraine headache lasts more than 72 hours. You have a fever or stiff neck. You have trouble seeing. Your muscles feel weak or like you cannot control them. You lose your balance a lot. You have trouble walking. You faint. You have a seizure. This information is not intended to replace advice given to you by your health care provider. Make sure you discuss any questions you have with your health care provider. Document Revised: 06/24/2022 Document Reviewed: 06/24/2022 Elsevier Patient Education  2023 Elsevier Inc.  

## 2023-02-24 ENCOUNTER — Encounter: Payer: Self-pay | Admitting: Nurse Practitioner

## 2023-02-24 ENCOUNTER — Ambulatory Visit (INDEPENDENT_AMBULATORY_CARE_PROVIDER_SITE_OTHER): Payer: Medicare Other | Admitting: Nurse Practitioner

## 2023-02-24 VITALS — BP 107/71 | HR 73 | Temp 97.5°F | Ht 64.02 in | Wt 114.5 lb

## 2023-02-24 DIAGNOSIS — G44229 Chronic tension-type headache, not intractable: Secondary | ICD-10-CM | POA: Diagnosis not present

## 2023-02-24 DIAGNOSIS — R768 Other specified abnormal immunological findings in serum: Secondary | ICD-10-CM

## 2023-02-24 DIAGNOSIS — R5382 Chronic fatigue, unspecified: Secondary | ICD-10-CM | POA: Diagnosis not present

## 2023-02-24 MED ORDER — VENLAFAXINE HCL 50 MG PO TABS: 50.0000 mg | ORAL_TABLET | Freq: Every day | ORAL | 4 refills | Status: DC

## 2023-02-24 MED ORDER — BUPROPION HCL ER (XL) 150 MG PO TB24
150.0000 mg | ORAL_TABLET | Freq: Every day | ORAL | 12 refills | Status: DC
Start: 2023-02-24 — End: 2023-06-09

## 2023-02-24 NOTE — Assessment & Plan Note (Signed)
Ongoing, all labs have been reassuring with exception of + ANA.  Fatigue and headaches continue.  ?chronic fatigue syndrome or fibromyalgia.  Will trial Wellbutrin 150 MG XL dosing, educated her on this.  She can take along with her Effexor daily.  Wellbutrin may offer benefit to anhedonia and energy levels.  Educated her on BLACK BOX warning and aware to notify provider if symptoms present.

## 2023-02-24 NOTE — Progress Notes (Signed)
BP 107/71   Pulse 73   Temp (!) 97.5 F (36.4 C) (Oral)   Ht 5' 4.02" (1.626 m)   Wt 114 lb 8 oz (51.9 kg)   SpO2 95%   BMI 19.64 kg/m    Subjective:    Patient ID: Patty Cruz, female    DOB: [birth year], 67 y.o.   MRN: [id number]  HPI: Patty Cruz is a 67 y.o. female  Chief Complaint  Patient presents with   Fatigue   Migraine   MIGRAINES Restarted Amitriptyline on 01/23/23 for headaches and chronic fatigue.  Is not taking this on a regular basis.  Helps some when takes this.  They have eased up some.  CT scan was normal on recent check.  She did have a ANA + and is scheduled upcoming to see rheumatology.    Continues on Effexor  for mood.   Duration: chronic Onset: gradual Severity: mild Quality: dull, aching, and throbbing Frequency: intermittent Location: temples Headache duration: occasional Radiation: no Time of day headache occurs: varies Alleviating factors: Amitriptyline Aggravating factors: unknown Headache status at time of visit: asymptomatic Treatments attempted: Amtriptyline, Ibuprofen   Aura: no Nausea: yes Vomiting: no Photophobia:  yes Phonophobia:  yes Effect on social functioning:  yes Numbers of missed days of school/work each month: 0 Confusion:  no Gait disturbance/ataxia:  no Behavioral changes:  no Fevers:  no     01/23/2023    9:26 AM 04/23/2021    1:58 PM 10/11/2020   10:38 AM 08/16/2020    4:06 PM 12/16/2019    8:13 AM  Depression screen PHQ 2/9  Decreased Interest 1 0 1 1 0  Down, Depressed, Hopeless 1 0 2 1 0  PHQ - 2 Score 2 0 3 2 0  Altered sleeping 1 3 2  0 1  Tired, decreased energy 3 3 3 2 3   Change in appetite 1 1 0 0 0  Feeling bad or failure about yourself  0 0 1 0 0  Trouble concentrating 0 0 0 0 1  Moving slowly or fidgety/restless 0 0 0 0 0  Suicidal thoughts 0 0 0 0 0  PHQ-9 Score 7 7 9 4 5   Difficult doing work/chores Somewhat difficult  Somewhat difficult Not difficult at all Somewhat difficult        01/23/2023    9:26 AM 08/16/2020    4:06 PM 04/28/2019    9:55 AM 03/31/2019   10:19 AM  GAD 7 : Generalized Anxiety Score  Nervous, Anxious, on Edge 0 0 1 2  Control/stop worrying 0 0 0 2  Worry too much - different things 0 0 0 2  Trouble relaxing 0 0 1 0  Restless 0 0 0 0  Easily annoyed or irritable 0 0 0 0  Afraid - awful might happen 0 0 0 1  Total GAD 7 Score 0 0 2 7  Anxiety Difficulty Not difficult at all Not difficult at all Somewhat difficult Somewhat difficult      Relevant past medical, surgical, family and social history reviewed and updated as indicated. Interim medical history since our last visit reviewed. Allergies and medications reviewed and updated.  Review of Systems  Constitutional:  Negative for activity change, appetite change, diaphoresis, fatigue and fever.  Respiratory:  Negative for cough, chest tightness and shortness of breath.   Cardiovascular:  Negative for chest pain, palpitations and leg swelling.  Neurological:  Positive for headaches. Negative for dizziness, tremors, speech difficulty, weakness, light-headedness  and numbness.  Psychiatric/Behavioral: Negative.      Per HPI unless specifically indicated above     Objective:    BP 107/71   Pulse 73   Temp (!) 97.5 F (36.4 C) (Oral)   Ht 5' 4.02" (1.626 m)   Wt 114 lb 8 oz (51.9 kg)   SpO2 95%   BMI 19.64 kg/m   Wt Readings from Last 3 Encounters:  02/24/23 114 lb 8 oz (51.9 kg)  02/17/23 110 lb (49.9 kg)  01/23/23 112 lb 8 oz (51 kg)    Physical Exam Vitals and nursing note reviewed.  Constitutional:      General: She is awake. She is not in acute distress.    Appearance: She is well-developed and well-groomed. She is not ill-appearing or toxic-appearing.  HENT:     Head: Normocephalic.     Right Ear: Hearing normal.     Left Ear: Hearing normal.  Eyes:     General: Lids are normal.        Right eye: No discharge.        Left eye: No discharge.     Extraocular Movements:  Extraocular movements intact.     Conjunctiva/sclera: Conjunctivae normal.     Visual Fields: Right eye visual fields normal and left eye visual fields normal.  Pulmonary:     Effort: Pulmonary effort is normal. No accessory muscle usage or respiratory distress.  Musculoskeletal:     Cervical back: Normal range of motion.  Neurological:     Mental Status: She is alert and oriented to person, place, and time.     Sensory: Sensation is intact.     Motor: Motor function is intact.     Coordination: Coordination is intact.     Gait: Gait is intact.     Deep Tendon Reflexes: Reflexes are normal and symmetric.     Reflex Scores:      Brachioradialis reflexes are 2+ on the right side and 2+ on the left side.      Patellar reflexes are 2+ on the right side and 2+ on the left side. Psychiatric:        Attention and Perception: Attention normal.        Mood and Affect: Mood normal.        Behavior: Behavior normal. Behavior is cooperative.        Thought Content: Thought content normal.        Judgment: Judgment normal.     Results for orders placed or performed in visit on 01/23/23  T4, free  Result Value Ref Range   Free T4 1.08 0.82 - 1.77 ng/dL  Thyroid peroxidase antibody  Result Value Ref Range   Thyroperoxidase Ab SerPl-aCnc 12 0 - 34 IU/mL  CBC with Differential/Platelet  Result Value Ref Range   WBC 7.0 3.4 - 10.8 x10E3/uL   RBC 4.21 3.77 - 5.28 x10E6/uL   Hemoglobin 13.3 11.1 - 15.9 g/dL   Hematocrit 16.1 09.6 - 46.6 %   MCV 95 79 - 97 fL   MCH 31.6 26.6 - 33.0 pg   MCHC 33.4 31.5 - 35.7 g/dL   RDW 04.5 40.9 - 81.1 %   Platelets 344 150 - 450 x10E3/uL   Neutrophils 72 Not Estab. %   Lymphs 16 Not Estab. %   Monocytes 8 Not Estab. %   Eos 3 Not Estab. %   Basos 1 Not Estab. %   Neutrophils Absolute 5.1 1.4 - 7.0 x10E3/uL  Lymphocytes Absolute 1.1 0.7 - 3.1 x10E3/uL   Monocytes Absolute 0.5 0.1 - 0.9 x10E3/uL   EOS (ABSOLUTE) 0.2 0.0 - 0.4 x10E3/uL   Basophils  Absolute 0.1 0.0 - 0.2 x10E3/uL   Immature Granulocytes 0 Not Estab. %   Immature Grans (Abs) 0.0 0.0 - 0.1 x10E3/uL  Comprehensive metabolic panel  Result Value Ref Range   Glucose 89 70 - 99 mg/dL   BUN 11 8 - 27 mg/dL   Creatinine, Ser 4.78 0.57 - 1.00 mg/dL   eGFR 97 >29 FA/OZH/0.86   BUN/Creatinine Ratio 17 12 - 28   Sodium 136 134 - 144 mmol/L   Potassium 4.3 3.5 - 5.2 mmol/L   Chloride 96 96 - 106 mmol/L   CO2 22 20 - 29 mmol/L   Calcium 9.7 8.7 - 10.3 mg/dL   Total Protein 7.6 6.0 - 8.5 g/dL   Albumin 5.1 (H) 3.9 - 4.9 g/dL   Globulin, Total 2.5 1.5 - 4.5 g/dL   Albumin/Globulin Ratio 2.0 1.2 - 2.2   Bilirubin Total 0.3 0.0 - 1.2 mg/dL   Alkaline Phosphatase 118 44 - 121 IU/L   AST 32 0 - 40 IU/L   ALT 25 0 - 32 IU/L  Lipid Panel w/o Chol/HDL Ratio  Result Value Ref Range   Cholesterol, Total 233 (H) 100 - 199 mg/dL   Triglycerides 68 0 - 149 mg/dL   HDL 94 >57 mg/dL   VLDL Cholesterol Cal 12 5 - 40 mg/dL   LDL Chol Calc (NIH) 846 (H) 0 - 99 mg/dL  TSH  Result Value Ref Range   TSH 4.170 0.450 - 4.500 uIU/mL  VITAMIN D 25 Hydroxy (Vit-D Deficiency, Fractures)  Result Value Ref Range   Vit D, 25-Hydroxy 51.5 30.0 - 100.0 ng/mL  Vitamin B12  Result Value Ref Range   Vitamin B-12 >2000 (H) 232 - 1245 pg/mL  Magnesium  Result Value Ref Range   Magnesium 2.2 1.6 - 2.3 mg/dL  Lipase  Result Value Ref Range   Lipase 29 14 - 72 U/L  Amylase  Result Value Ref Range   Amylase 65 31 - 110 U/L  Lyme disease, western blot  Result Value Ref Range     IgG P93 Ab. Absent      IgG P66 Ab. Absent      IgG P58 Ab. Absent      IgG P45 Ab. Present (A)      IgG P41 Ab. Present (A)      IgG P39 Ab. Absent      IgG P30 Ab. Absent      IgG P28 Ab. Absent      IgG P23 Ab. Absent      IgG P18 Ab. Absent    Lyme IgG Wb Negative      IgM P41 Ab. Absent      IgM P39 Ab. Absent      IgM P23 Ab. Absent    Lyme IgM Wb Negative   ANA 12 Plus Profile (RDL)  Result Value Ref  Range   Anti-Nuclear Ab by IFA (RDL) Positive (A) Negative  Sedimentation rate  Result Value Ref Range   Sed Rate 8 0 - 40 mm/hr  C-reactive protein  Result Value Ref Range   CRP <1 0 - 10 mg/L  ANA 12 Plus Profile, Positive  Result Value Ref Range   Speckled Pattern 1:160 (H) <1:40   Note: Comment    Anti-Centromere Ab (RDL) <1:40 <1:40   Anti-dsDNA Ab  by Farr(RDL) <8.0 <8.0 IU/mL   Anti-Sm Ab (RDL) <20 <20 Units   Anti-U1 RNP Ab (RDL) <20 <20 Units   Anti-Ro (SS-A) Ab (RDL) <20 <20 Units   Anti-La (SS-B) Ab (RDL) <20 <20 Units   Anti-Scl-70 Ab (RDL) <20 <20 Units   Anti-Cardiolipin Ab, IgG (RDL) <15 <15 GPL U/mL   Anti-Cardiolipin Ab, IgA (RDL) <12 <12 APL U/mL   Anti-Cardiolipin Ab, IgM (RDL) <13 <13 MPL U/mL   C3 Complement (RDL) 120 82 - 167 mg/dL   C4 Complement (RDL) 17 14 - 44 mg/dL   Anti-TPO Ab (RDL) CANCELED IU/mL   Anti-Chromatin Ab, IgG (RDL) <20 <20 Units   Anti-CCP Ab, IgG & IgA (RDL) <20 <20 Units   Rheumatoid Factor by Turb RDL <14 <14 IU/mL   ANA Plus 12 Interpretation Comment       Assessment & Plan:   Problem List Items Addressed This Visit       Other   ANA positive    Scheduled to see rheumatology upcoming, appreciate their input.      Chronic fatigue    Ongoing, all labs have been reassuring with exception of + ANA.  Fatigue and headaches continue.  ?chronic fatigue syndrome or fibromyalgia.  Will trial Wellbutrin 150 MG XL dosing, educated her on this.  She can take along with her Effexor daily.  Wellbutrin may offer benefit to anhedonia and energy levels.  Educated her on BLACK BOX warning and aware to notify provider if symptoms present.      Chronic headaches - Primary    Chronic, ongoing.  CT imaging reassuring.  Continue Amitriptyline as is offering her benefit even though not taking consistently and continue Zofran for nausea.  Monitor closely .  No red flag symptoms at this time.      Relevant Medications   buPROPion (WELLBUTRIN XL) 150  MG 24 hr tablet   venlafaxine (EFFEXOR) 50 MG tablet     Follow up plan: Return in about 1 month (around 03/28/2023) for MOOD AND FATIGUE -- added Wellbutrin.

## 2023-02-24 NOTE — Assessment & Plan Note (Signed)
Chronic, ongoing.  CT imaging reassuring.  Continue Amitriptyline as is offering her benefit even though not taking consistently and continue Zofran for nausea.  Monitor closely .  No red flag symptoms at this time.

## 2023-02-24 NOTE — Assessment & Plan Note (Signed)
Scheduled to see rheumatology upcoming, appreciate their input.

## 2023-03-18 DIAGNOSIS — K080 Exfoliation of teeth due to systemic causes: Secondary | ICD-10-CM | POA: Diagnosis not present

## 2023-03-19 ENCOUNTER — Encounter: Payer: Self-pay | Admitting: Gastroenterology

## 2023-03-28 DIAGNOSIS — M2042 Other hammer toe(s) (acquired), left foot: Secondary | ICD-10-CM | POA: Diagnosis not present

## 2023-03-28 DIAGNOSIS — M2012 Hallux valgus (acquired), left foot: Secondary | ICD-10-CM | POA: Diagnosis not present

## 2023-04-01 ENCOUNTER — Other Ambulatory Visit: Payer: Self-pay | Admitting: Podiatry

## 2023-04-02 ENCOUNTER — Ambulatory Visit: Payer: Medicare Other | Admitting: Nurse Practitioner

## 2023-04-08 DIAGNOSIS — K080 Exfoliation of teeth due to systemic causes: Secondary | ICD-10-CM | POA: Diagnosis not present

## 2023-04-10 ENCOUNTER — Encounter: Payer: Self-pay | Admitting: Podiatry

## 2023-04-10 ENCOUNTER — Other Ambulatory Visit: Payer: Self-pay

## 2023-04-11 ENCOUNTER — Encounter: Payer: Self-pay | Admitting: Podiatry

## 2023-04-11 NOTE — Anesthesia Preprocedure Evaluation (Signed)
Anesthesia Evaluation  Patient identified by MRN, date of birth, ID band Patient awake    Reviewed: Allergy & Precautions, H&P , NPO status , Patient's Chart, lab work & pertinent test results  Airway Mallampati: III  TM Distance: >3 FB Neck ROM: Full    Dental  (+) Caps Caps upper central and lateral incisors bilaterally:   Pulmonary neg pulmonary ROS   Pulmonary exam normal breath sounds clear to auscultation       Cardiovascular negative cardio ROS Normal cardiovascular exam Rhythm:Regular Rate:Normal  EKG NSR 04-15-20   Neuro/Psych  Headaches PSYCHIATRIC DISORDERS Anxiety Depression    negative neurological ROS  negative psych ROS   GI/Hepatic negative GI ROS, Neg liver ROS,,,  Endo/Other  negative endocrine ROS    Renal/GU negative Renal ROS  negative genitourinary   Musculoskeletal negative musculoskeletal ROS (+) Arthritis ,    Abdominal   Peds negative pediatric ROS (+)  Hematology negative hematology ROS (+)   Anesthesia Other Findings Alcohol abuse  Depression Anxiety  Alcohol use disorder, severe, dependence  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)  Alcohol withdrawal (HCC) Frontal headache  Arthritis  Patient states no alcohol use now Motion sickness  Vertigo    Reproductive/Obstetrics negative OB ROS                             Anesthesia Physical Anesthesia Plan  ASA: 3  Anesthesia Plan:    Post-op Pain Management:    Induction: Intravenous  PONV Risk Score and Plan:   Airway Management Planned: LMA  Additional Equipment:   Intra-op Plan:   Post-operative Plan: Extubation in OR  Informed Consent: I have reviewed the patients History and Physical, chart, labs and discussed the procedure including the risks, benefits and alternatives for the proposed anesthesia with the patient or authorized  representative who has indicated his/her understanding and acceptance.     Dental Advisory Given  Plan Discussed with: Anesthesiologist, CRNA and Surgeon  Anesthesia Plan Comments: (Patient consented for risks of anesthesia including but not limited to:  - adverse reactions to medications - damage to eyes, teeth, lips or other oral mucosa - nerve damage due to positioning  - sore throat or hoarseness - Damage to heart, brain, nerves, lungs, other parts of body or loss of life  Patient voiced understanding.)       Anesthesia Quick Evaluation

## 2023-04-14 DIAGNOSIS — M9901 Segmental and somatic dysfunction of cervical region: Secondary | ICD-10-CM | POA: Diagnosis not present

## 2023-04-14 DIAGNOSIS — M542 Cervicalgia: Secondary | ICD-10-CM | POA: Diagnosis not present

## 2023-04-14 DIAGNOSIS — K080 Exfoliation of teeth due to systemic causes: Secondary | ICD-10-CM | POA: Diagnosis not present

## 2023-04-14 DIAGNOSIS — M9902 Segmental and somatic dysfunction of thoracic region: Secondary | ICD-10-CM | POA: Diagnosis not present

## 2023-04-14 DIAGNOSIS — R519 Headache, unspecified: Secondary | ICD-10-CM | POA: Diagnosis not present

## 2023-04-15 NOTE — Discharge Instructions (Signed)
[facility]  POST OPERATIVE INSTRUCTIONS FOR DR. FOWLER AND [facility]   Take your medication as prescribed.  Pain medication should be taken only as needed.  Keep the dressing clean, dry and intact.  Keep your foot elevated above the heart level for the first 48 hours.  Walking to the bathroom and brief periods of walking are acceptable, unless we have instructed you to be non-weight bearing.  Always wear your post-op shoe when walking.  Always use your crutches if you are to be non-weight bearing.  Do not take a shower. Baths are permissible as long as the foot is kept out of the water.   Every hour you are awake:  Bend your knee 15 times. Flex foot 15 times Massage calf 15 times  Call [facility] ([phone]) if any of the following problems occur: You develop a temperature or fever. The bandage becomes saturated with blood. Medication does not stop your pain. Injury of the foot occurs. Any symptoms of infection including redness, odor, or red streaks running from wound. 

## 2023-04-16 ENCOUNTER — Ambulatory Visit: Payer: Medicare Other | Admitting: Anesthesiology

## 2023-04-16 ENCOUNTER — Encounter
Admission: RE | Disposition: A | Discharge status: Home / Self Care | Payer: Self-pay | Source: Ambulatory Visit | Attending: Podiatry

## 2023-04-16 ENCOUNTER — Encounter: Payer: Self-pay | Admitting: Podiatry

## 2023-04-16 ENCOUNTER — Other Ambulatory Visit: Payer: Self-pay

## 2023-04-16 ENCOUNTER — Ambulatory Visit
Admission: RE | Admit: 2023-04-16 | Discharge: 2023-04-16 | Disposition: A | Discharge status: Home / Self Care | Payer: Medicare Other | Source: Ambulatory Visit | Attending: Podiatry | Admitting: Podiatry

## 2023-04-16 ENCOUNTER — Ambulatory Visit: Payer: Self-pay

## 2023-04-16 DIAGNOSIS — M9901 Segmental and somatic dysfunction of cervical region: Secondary | ICD-10-CM | POA: Diagnosis not present

## 2023-04-16 DIAGNOSIS — F418 Other specified anxiety disorders: Secondary | ICD-10-CM | POA: Insufficient documentation

## 2023-04-16 DIAGNOSIS — M216X2 Other acquired deformities of left foot: Secondary | ICD-10-CM | POA: Diagnosis not present

## 2023-04-16 DIAGNOSIS — M542 Cervicalgia: Secondary | ICD-10-CM | POA: Diagnosis not present

## 2023-04-16 DIAGNOSIS — R519 Headache, unspecified: Secondary | ICD-10-CM | POA: Diagnosis not present

## 2023-04-16 DIAGNOSIS — M1990 Unspecified osteoarthritis, unspecified site: Secondary | ICD-10-CM | POA: Insufficient documentation

## 2023-04-16 DIAGNOSIS — M2042 Other hammer toe(s) (acquired), left foot: Secondary | ICD-10-CM | POA: Diagnosis not present

## 2023-04-16 DIAGNOSIS — M9902 Segmental and somatic dysfunction of thoracic region: Secondary | ICD-10-CM | POA: Diagnosis not present

## 2023-04-16 DIAGNOSIS — M2012 Hallux valgus (acquired), left foot: Secondary | ICD-10-CM | POA: Diagnosis not present

## 2023-04-16 HISTORY — DX: Motion sickness, initial encounter: T75.3XXA

## 2023-04-16 HISTORY — DX: Chronic fatigue, unspecified: R53.82

## 2023-04-16 HISTORY — PX: WEIL OSTEOTOMY: SHX5044

## 2023-04-16 HISTORY — DX: Dizziness and giddiness: R42

## 2023-04-16 HISTORY — PX: BUNIONECTOMY: SHX129

## 2023-04-16 HISTORY — DX: Unspecified osteoarthritis, unspecified site: M19.90

## 2023-04-16 HISTORY — DX: Chronic tension-type headache, not intractable: G44.229

## 2023-04-16 HISTORY — PX: HAMMER TOE SURGERY: SHX385

## 2023-04-16 HISTORY — PX: AIKEN OSTEOTOMY: SHX6331

## 2023-04-16 SURGERY — BUNIONECTOMY
Anesthesia: General | Site: Toe | Laterality: Left

## 2023-04-16 MED ORDER — KETOROLAC TROMETHAMINE 15 MG/ML IJ SOLN
INTRAMUSCULAR | Status: DC | PRN
  Administered 2023-04-16: 15 mg via INTRAVENOUS

## 2023-04-16 MED ORDER — SODIUM CHLORIDE 0.9 % IR SOLN
Status: DC | PRN
  Administered 2023-04-16: 1

## 2023-04-16 MED ORDER — ONDANSETRON HCL 4 MG/2ML IJ SOLN
INTRAMUSCULAR | Status: DC | PRN
  Administered 2023-04-16: 4 mg via INTRAVENOUS

## 2023-04-16 MED ORDER — METOCLOPRAMIDE HCL 5 MG/ML IJ SOLN: 5.0000 mg | Freq: Three times a day (TID) | INTRAMUSCULAR | Status: DC | PRN

## 2023-04-16 MED ORDER — OXYCODONE-ACETAMINOPHEN 5-325 MG PO TABS: 1.0000 | ORAL_TABLET | Freq: Four times a day (QID) | ORAL | 0 refills | Status: DC | PRN

## 2023-04-16 MED ORDER — BUPIVACAINE LIPOSOME 1.3 % IJ SUSP
INTRAMUSCULAR | Status: DC | PRN
  Administered 2023-04-16: 10 mL

## 2023-04-16 MED ORDER — ONDANSETRON HCL 4 MG/2ML IJ SOLN: 4.0000 mg | Freq: Four times a day (QID) | INTRAMUSCULAR | Status: DC | PRN

## 2023-04-16 MED ORDER — BUPIVACAINE HCL (PF) 0.25 % IJ SOLN
INTRAMUSCULAR | Status: DC | PRN
  Administered 2023-04-16: 10 mL

## 2023-04-16 MED ORDER — DEXAMETHASONE SODIUM PHOSPHATE 4 MG/ML IJ SOLN
INTRAMUSCULAR | Status: DC | PRN
  Administered 2023-04-16: 8 mg via INTRAVENOUS

## 2023-04-16 MED ORDER — LIDOCAINE HCL (CARDIAC) PF 100 MG/5ML IV SOSY
PREFILLED_SYRINGE | INTRAVENOUS | Status: DC | PRN
  Administered 2023-04-16: 50 mg via INTRATRACHEAL

## 2023-04-16 MED ORDER — OXYCODONE HCL 5 MG PO TABS
10.0000 mg | ORAL_TABLET | Freq: Once | ORAL | Status: AC
  Administered 2023-04-16: 10 mg via ORAL

## 2023-04-16 MED ORDER — CEFAZOLIN SODIUM-DEXTROSE 2-4 GM/100ML-% IV SOLN
2.0000 g | INTRAVENOUS | Status: AC
  Administered 2023-04-16: 2 g via INTRAVENOUS

## 2023-04-16 MED ORDER — FENTANYL CITRATE (PF) 100 MCG/2ML IJ SOLN
INTRAMUSCULAR | Status: DC | PRN
  Administered 2023-04-16: 100 ug via INTRAVENOUS

## 2023-04-16 MED ORDER — METOCLOPRAMIDE HCL 5 MG PO TABS: 5.0000 mg | ORAL_TABLET | Freq: Three times a day (TID) | ORAL | Status: DC | PRN

## 2023-04-16 MED ORDER — PROPOFOL 10 MG/ML IV BOLUS
INTRAVENOUS | Status: DC | PRN
  Administered 2023-04-16: 150 mg via INTRAVENOUS

## 2023-04-16 MED ORDER — LACTATED RINGERS IV SOLN: INTRAVENOUS | Status: DC

## 2023-04-16 MED ORDER — ONDANSETRON HCL 4 MG PO TABS: 4.0000 mg | ORAL_TABLET | Freq: Four times a day (QID) | ORAL | Status: DC | PRN

## 2023-04-16 MED ORDER — GLYCOPYRROLATE 0.2 MG/ML IJ SOLN
INTRAMUSCULAR | Status: DC | PRN
  Administered 2023-04-16 (×2): .2 mg via INTRAVENOUS

## 2023-04-16 SURGICAL SUPPLY — 62 items
APL SKNCLS STERI-STRIP NONHPOA (GAUZE/BANDAGES/DRESSINGS) ×2
BENZOIN TINCTURE PRP APPL 2/3 (GAUZE/BANDAGES/DRESSINGS) ×3 IMPLANT
BIT DRILL 1.7 LNG CANN (DRILL) IMPLANT
BLADE OSC/SAGITTAL 5.5X25 (BLADE) IMPLANT
BLADE OSC/SAGITTAL MD 5.5X18 (BLADE) IMPLANT
BLADE OSC/SAGITTAL MD 9X18.5 (BLADE) IMPLANT
BLADE SAW LAPIPLASTY 40X11 (BLADE) IMPLANT
BLADE SURG 15 STRL LF DISP TIS (BLADE) IMPLANT
BLADE SURG 15 STRL SS (BLADE) ×2
BNDG CMPR 5X4 CHSV STRCH STRL (GAUZE/BANDAGES/DRESSINGS) ×2
BNDG CMPR 75X41 PLY HI ABS (GAUZE/BANDAGES/DRESSINGS) ×2
BNDG CMPR STD VLCR NS LF 5.8X4 (GAUZE/BANDAGES/DRESSINGS) ×2
BNDG COHESIVE 4X5 TAN STRL LF (GAUZE/BANDAGES/DRESSINGS) ×3 IMPLANT
BNDG ELASTIC 4X5.8 VLCR NS LF (GAUZE/BANDAGES/DRESSINGS) ×3 IMPLANT
BNDG ESMARCH 4 X 12 STRL LF (GAUZE/BANDAGES/DRESSINGS) ×2
BNDG ESMARCH 4X12 STRL LF (GAUZE/BANDAGES/DRESSINGS) ×3 IMPLANT
BNDG GAUZE DERMACEA FLUFF 4 (GAUZE/BANDAGES/DRESSINGS) ×3 IMPLANT
BNDG GZE DERMACEA 4 6PLY (GAUZE/BANDAGES/DRESSINGS) ×2
BNDG STRETCH 4X75 STRL LF (GAUZE/BANDAGES/DRESSINGS) ×3 IMPLANT
BOOT STEPPER DURA SM (SOFTGOODS) IMPLANT
CANISTER SUCT 1200ML W/VALVE (MISCELLANEOUS) ×3 IMPLANT
CLIP FIXATION STAPLE 10X10X10 (Staple) IMPLANT
CNTRSNK DRL 2 SCR (MISCELLANEOUS) IMPLANT
COUNTERSINK 2.0 (MISCELLANEOUS) ×2
COVER LIGHT HANDLE UNIVERSAL (MISCELLANEOUS) ×6 IMPLANT
DRAPE FLUOR MINI C-ARM 54X84 (DRAPES) ×3 IMPLANT
DURAPREP 26ML APPLICATOR (WOUND CARE) ×3 IMPLANT
ELECT REM PT RETURN 9FT ADLT (ELECTROSURGICAL) ×2
ELECTRODE REM PT RTRN 9FT ADLT (ELECTROSURGICAL) ×3 IMPLANT
GAUZE SPONGE 4X4 12PLY STRL (GAUZE/BANDAGES/DRESSINGS) ×3 IMPLANT
GAUZE XEROFORM 1X8 LF (GAUZE/BANDAGES/DRESSINGS) ×3 IMPLANT
GLOVE SRG 8 PF TXTR STRL LF DI (GLOVE) ×6 IMPLANT
GLOVE SURG ENC MOIS LTX SZ7.5 (GLOVE) ×6 IMPLANT
GLOVE SURG UNDER POLY LF SZ8 (GLOVE) ×4
GOWN SPEC L4 XLG W/TWL (GOWN DISPOSABLE) ×3 IMPLANT
GOWN STRL REUS W/ TWL LRG LVL3 (GOWN DISPOSABLE) ×6 IMPLANT
GOWN STRL REUS W/TWL LRG LVL3 (GOWN DISPOSABLE) ×4
K-WIRE DBL END TROCAR 6X.045 (WIRE) ×4
KIT PROCEDURE DRILL (DRILL) IMPLANT
KIT TURNOVER KIT A (KITS) ×3 IMPLANT
KWIRE DBL END TROCAR 6X.045 (WIRE) IMPLANT
LAPIPLASTY SYS 4A (Orthopedic Implant) ×2 IMPLANT
NS IRRIG 500ML POUR BTL (IV SOLUTION) ×3 IMPLANT
PACK EXTREMITY ARMC (MISCELLANEOUS) ×3 IMPLANT
PIN BALLS 3/8 F/.045 WIRE (MISCELLANEOUS) ×3 IMPLANT
RASP SM TEAR CROSS CUT (RASP) IMPLANT
SCREW 2.7 HIGH PITCH LOCKING (Screw) IMPLANT
SCREW CANN HEAD ST 2.0X13 (Screw) IMPLANT
SCREW HIGH PITCH LOCK 2.7 (Screw) IMPLANT
STOCKINETTE IMPERVIOUS LG (DRAPES) ×3 IMPLANT
STRIP CLOSURE SKIN 1/4X4 (GAUZE/BANDAGES/DRESSINGS) ×3 IMPLANT
SUT ETHILON 4 0 FS (SUTURE) IMPLANT
SUT MNCRL 4-0 (SUTURE) ×2
SUT MNCRL 4-0 27XMFL (SUTURE) ×2
SUT VIC AB 3-0 SH 27 (SUTURE) ×2
SUT VIC AB 3-0 SH 27X BRD (SUTURE) IMPLANT
SUT VIC AB 4-0 FS2 27 (SUTURE) IMPLANT
SUT VIC AB 4-0 SH 27 (SUTURE) ×2
SUT VIC AB 4-0 SH 27XANBCTRL (SUTURE) IMPLANT
SUTURE MNCRL 4-0 27XMF (SUTURE) ×3 IMPLANT
SYSTEM LAPIPLASTY 4A (Orthopedic Implant) IMPLANT
WIRE SMOOTH TROCAR .9MMX150MML (WIRE) IMPLANT

## 2023-04-16 NOTE — Op Note (Signed)
Operative note   Surgeon:Jaquelyne Firkus [employer] forces logistics/support/administrative officer: None    Preop diagnosis: 1.  Hallux valgus deformity left foot 2.  Hammertoe contracture left second toe 3.  Contracted joint left second MTPJ    Postop diagnosis: Same    Procedure: 1.  Lapidus hallux valgus correction left foot 2.  Akin proximal phalanx osteotomy left great toe 3.  PIPJ arthrodesis left second toe 4.  Weil second metatarsal osteotomy left foot 5.  Intraoperative fluoroscopy use without assistance of radiologist    EBL: Minimal    Anesthesia:local and general.  Local consists of a one-to-one mixture of 0.25% plain bupivacaine and Exparel long-acting anesthetic    Hemostasis: Midcalf tourniquet inflated to 200 mmHg for 120 minutes    Specimen: None    Complications: None    Operative indications:Patty Cruz is an 67 y.o. that presents today for surgical intervention.  The risks/benefits/alternatives/complications have been discussed and consent has been given.    Procedure:  Patient was brought into the OR and placed on the operating table in thesupine position. After anesthesia was obtained theleft lower extremity was prepped and draped in usual sterile fashion.  Attention was directed to the left foot where a longitudinal incision was performed from the interphalangeal joint of the great toe to the first met cuneiform joint region.  Sharp and blunt dissection carried down to the capsule.  The intermetatarsal space was entered.  The DTI L was transected.  The conjoined tendon of the abductor was released.  Attention was then directed to the first met cuneiform joint.  The joint was evaluated and dissected free.  Next the fulcrum was placed in the intermetatarsal space.  The joint positioner was placed along the medial aspect of the metatarsal a small stab incision was made along the lateral aspect of the second metatarsal.  I was able to reposition the first metatarsal into much better aligned position at this time.   After fluoroscopy the cut guide was fashioned in the normal technique.  Converging cuts were made with the apex medial.  The bone wedges were removed.  This exposed the subchondral bone plate and medullary bone.  The joint was then prepared with a 2.0 mm drill bit.  The compression device was placed dorsally and good compression and realignment was noted in all planes.  Fluoroscopy was used to confirm the realignment and compression.  This time the joint was stabilized with a compression guidewire.  The medial and dorsal plates were placed on the Lapa plasty set.  Good stability and alignment was noted.  All guides and wires were removed at this time.  Attention was directed to the dorsomedial eminence that was transected and smoothed with a power rasp.  Mild residual valgus of the great toe was noted.  An Aiken osteotomy was created at the proximal phalanx and stabilized with a compression staple.  Better realignment of the great toe was noted after this.  This wound was then closed with a small capsulorrhaphy performed medially.  The deeper and capsular layers were closed with a 3-0 Vicryl.  The subcutaneous tissue was closed with a 4-0 Vicryl.  Attention was directed to the second toe that was still totally dislocated and medially deviated.  A curvilinear incision was performed from the lateral aspect of the distal second metatarsal coursing over the MTPJ ending at the PIPJ.  The long extensor tendon was noted.  This was lengthened in the Z-plasty fashion and reflected.  The dorsal medial and  lateral capsule of the metatarsophalangeal joint was released.  A McGlamery elevator was used to release the plantar plate.  Next a Weil osteotomy was created from distal to proximal.  The capital fragment was translated proximal.  This was then stabilized with a 2 oh cannulated screw from the Paragon screw set.  Good stability and alignment was noted.  At this time the head of the proximal phalanx was removed as well as  the base of the middle phalanx.  A 0.045 K wire was driven from the base of the middle phalanx to the tip of the toe crossing the PIPJ to the base of the toe.  At this time elected to cross the K wire to the metatarsal head for further realignment and stability.  All wounds were flushed with copious amounts of irrigation.  The extensor tendon lengthening was reanastomosed with a 4-0 Vicryl as well as the subcutaneous tissue.  The skin to the second MTPJ and toe was closed with a 4-0 nylon.  The skin to the first MTPJ met cuneiform region was closed with a 4-0 Monocryl.  A bulky sterile dressing was applied to all areas.  She was placed in a tall equalizer walker boot.   Patient tolerated the procedure and anesthesia well.  Was transported from the OR to the PACU with all vital signs stable and vascular status intact. To be discharged per routine protocol.  Will follow up in approximately 1 week in the outpatient clinic.

## 2023-04-16 NOTE — Anesthesia Procedure Notes (Signed)
Procedure Name: LMA Insertion Date/Time: 04/16/2023 2:05 PM  Performed by: Domenic Moras, CRNAPre-anesthesia Checklist: Patient identified, Emergency Drugs available, Suction available and Patient being monitored Patient Re-evaluated:Patient Re-evaluated prior to induction Oxygen Delivery Method: Circle system utilized Preoxygenation: Pre-oxygenation with 100% oxygen Induction Type: IV induction Ventilation: Mask ventilation without difficulty LMA: LMA inserted LMA Size: 4.0 Tube type: Oral Number of attempts: 1 Placement Confirmation: positive ETCO2 and breath sounds checked- equal and bilateral Tube secured with: Tape Dental Injury: Teeth and Oropharynx as per pre-operative assessment

## 2023-04-16 NOTE — Transfer of Care (Signed)
Immediate Anesthesia Transfer of Care Note  Patient: Patty Cruz  Procedure(s) Performed: LAPIDUS - TYPE (Left: Toe) [address] PHALANX OSTEOTOMY - AKIN (Left: Toe) 60454 WEIL - SECOND (Left: Foot) HAMMER TOE CORRECTION - SECOND (Left: Toe)  Patient Location: PACU  Anesthesia Type: General  Level of Consciousness: awake, alert  and patient cooperative  Airway and Oxygen Therapy: Patient Spontanous Breathing and Patient connected to supplemental oxygen  Post-op Assessment: Post-op Vital signs reviewed, Patient's Cardiovascular Status Stable, Respiratory Function Stable, Patent Airway and No signs of Nausea or vomiting  Post-op Vital Signs: Reviewed and stable  Complications: No notable events documented.

## 2023-04-16 NOTE — H&P (Signed)
HISTORY AND PHYSICAL INTERVAL NOTE:  04/16/2023  1:38 PM  Patty Cruz  has presented today for surgery, with the diagnosis of M20.12 - Hallux valgus of left foot M20.42 - Hammertoe of left foot.  The various methods of treatment have been discussed with the patient.  No guarantees were given.  After consideration of risks, benefits and other options for treatment, the patient has consented to surgery.  I have reviewed the patients' chart and labs.     A history and physical examination was performed in my office.  The patient was reexamined.  There have been no changes to this history and physical examination.  Gwyneth Revels A

## 2023-04-18 ENCOUNTER — Encounter: Payer: Self-pay | Admitting: Podiatry

## 2023-04-18 NOTE — Anesthesia Postprocedure Evaluation (Signed)
Anesthesia Post Note  Patient: Patty Cruz  Procedure(s) Performed: LAPIDUS - TYPE (Left: Toe) 16109 PHALANX OSTEOTOMY - AKIN (Left: Toe) 60454 WEIL - SECOND (Left: Foot) HAMMER TOE CORRECTION - SECOND (Left: Toe)  Patient location during evaluation: PACU Anesthesia Type: General Level of consciousness: awake and alert Pain management: pain level controlled Vital Signs Assessment: post-procedure vital signs reviewed and stable Respiratory status: spontaneous breathing, nonlabored ventilation, respiratory function stable and patient connected to nasal cannula oxygen Cardiovascular status: blood pressure returned to baseline and stable Postop Assessment: no apparent nausea or vomiting Anesthetic complications: no   No notable events documented.   Last Vitals:  Vitals:   04/16/23 1645 04/16/23 1650  BP: 113/70 112/66  Pulse: 84 85  Resp: 17 17  Temp:    SpO2: 96% 96%    Last Pain:  Vitals:   04/17/23 1044  TempSrc:   PainSc: 0-No pain                 Jaysean Manville C Grigor Lipschutz

## 2023-04-22 DIAGNOSIS — M2012 Hallux valgus (acquired), left foot: Secondary | ICD-10-CM | POA: Diagnosis not present

## 2023-04-22 DIAGNOSIS — M2042 Other hammer toe(s) (acquired), left foot: Secondary | ICD-10-CM | POA: Diagnosis not present

## 2023-05-08 DIAGNOSIS — M2012 Hallux valgus (acquired), left foot: Secondary | ICD-10-CM | POA: Diagnosis not present

## 2023-05-20 DIAGNOSIS — R519 Headache, unspecified: Secondary | ICD-10-CM | POA: Diagnosis not present

## 2023-05-20 DIAGNOSIS — M9901 Segmental and somatic dysfunction of cervical region: Secondary | ICD-10-CM | POA: Diagnosis not present

## 2023-05-20 DIAGNOSIS — M9902 Segmental and somatic dysfunction of thoracic region: Secondary | ICD-10-CM | POA: Diagnosis not present

## 2023-05-20 DIAGNOSIS — M542 Cervicalgia: Secondary | ICD-10-CM | POA: Diagnosis not present

## 2023-05-21 DIAGNOSIS — M9901 Segmental and somatic dysfunction of cervical region: Secondary | ICD-10-CM | POA: Diagnosis not present

## 2023-05-21 DIAGNOSIS — M542 Cervicalgia: Secondary | ICD-10-CM | POA: Diagnosis not present

## 2023-05-21 DIAGNOSIS — M9902 Segmental and somatic dysfunction of thoracic region: Secondary | ICD-10-CM | POA: Diagnosis not present

## 2023-05-21 DIAGNOSIS — R519 Headache, unspecified: Secondary | ICD-10-CM | POA: Diagnosis not present

## 2023-05-26 DIAGNOSIS — M9902 Segmental and somatic dysfunction of thoracic region: Secondary | ICD-10-CM | POA: Diagnosis not present

## 2023-05-26 DIAGNOSIS — R768 Other specified abnormal immunological findings in serum: Secondary | ICD-10-CM | POA: Diagnosis not present

## 2023-05-26 DIAGNOSIS — M9901 Segmental and somatic dysfunction of cervical region: Secondary | ICD-10-CM | POA: Diagnosis not present

## 2023-05-26 DIAGNOSIS — M542 Cervicalgia: Secondary | ICD-10-CM | POA: Diagnosis not present

## 2023-05-26 DIAGNOSIS — R519 Headache, unspecified: Secondary | ICD-10-CM | POA: Diagnosis not present

## 2023-05-26 DIAGNOSIS — R5382 Chronic fatigue, unspecified: Secondary | ICD-10-CM | POA: Diagnosis not present

## 2023-05-28 DIAGNOSIS — M9902 Segmental and somatic dysfunction of thoracic region: Secondary | ICD-10-CM | POA: Diagnosis not present

## 2023-05-28 DIAGNOSIS — M542 Cervicalgia: Secondary | ICD-10-CM | POA: Diagnosis not present

## 2023-05-28 DIAGNOSIS — M9901 Segmental and somatic dysfunction of cervical region: Secondary | ICD-10-CM | POA: Diagnosis not present

## 2023-05-28 DIAGNOSIS — R519 Headache, unspecified: Secondary | ICD-10-CM | POA: Diagnosis not present

## 2023-05-29 DIAGNOSIS — M2012 Hallux valgus (acquired), left foot: Secondary | ICD-10-CM | POA: Diagnosis not present

## 2023-05-29 DIAGNOSIS — M2042 Other hammer toe(s) (acquired), left foot: Secondary | ICD-10-CM | POA: Diagnosis not present

## 2023-06-02 DIAGNOSIS — M9902 Segmental and somatic dysfunction of thoracic region: Secondary | ICD-10-CM | POA: Diagnosis not present

## 2023-06-02 DIAGNOSIS — M542 Cervicalgia: Secondary | ICD-10-CM | POA: Diagnosis not present

## 2023-06-02 DIAGNOSIS — R519 Headache, unspecified: Secondary | ICD-10-CM | POA: Diagnosis not present

## 2023-06-02 DIAGNOSIS — M9901 Segmental and somatic dysfunction of cervical region: Secondary | ICD-10-CM | POA: Diagnosis not present

## 2023-06-07 DIAGNOSIS — Z8739 Personal history of other diseases of the musculoskeletal system and connective tissue: Secondary | ICD-10-CM | POA: Insufficient documentation

## 2023-06-07 NOTE — Patient Instructions (Signed)
Please call to schedule your mammogram and/or bone density: [facility] at [facility]  Address: [address], [place], [place] [address] Phone: [phone]  [facility] [address] [place],  [place]  [address] Phone: [phone]   Managing Depression, Adult Depression is a mental health condition that affects your thoughts, feelings, and actions. Being diagnosed with depression can bring you relief if you did not know why you have felt or behaved a certain way. It could also leave you feeling overwhelmed. Finding ways to manage your symptoms can help you feel more positive about your future. How to manage lifestyle changes Being depressed is difficult. Depression can increase the level of everyday stress. Stress can make depression symptoms worse. You may believe your symptoms cannot be managed or will never improve. However, there are many things you can try to help manage your symptoms. There is hope. Managing stress  Stress is your body's reaction to life changes and events, both good and bad. Stress can add to your feelings of depression. Learning to manage your stress can help lessen your feelings of depression. Try some of the following approaches to reducing your stress (stress reduction techniques): Listen to music that you enjoy and that inspires you. Try using a meditation app or take a meditation class. Develop a practice that helps you connect with your spiritual self. Walk in nature, pray, or go to a place of worship. Practice deep breathing. To do this, inhale slowly through your nose. Pause at the top of your inhale for a few seconds and then exhale slowly, letting yourself relax. Repeat this three or four times. Practice yoga to help relax and work your muscles. Choose a stress reduction technique that works for you. These techniques take time and practice to develop. Set aside 5-15 minutes a day to do them.  Therapists can offer training in these techniques. Do these things to help manage stress: Keep a journal. Know your limits. Set healthy boundaries for yourself and others, such as saying "no" when you think something is too much. Pay attention to how you react to certain situations. You may not be able to control everything, but you can change your reaction. Add humor to your life by watching funny movies or shows. Make time for activities that you enjoy and that relax you. Spend less time using electronics, especially at night before bed. The light from screens can make your brain think it is time to get up rather than go to bed.  Medicines Medicines, such as antidepressants, are often a part of treatment for depression. Talk with your pharmacist or health care provider about all the medicines, supplements, and herbal products that you take, their possible side effects, and what medicines and other products are safe to take together. Make sure to report any side effects you may have to your health care provider. Relationships Your health care provider may suggest family therapy, couples therapy, or individual therapy as part of your treatment. How to recognize changes Everyone responds differently to treatment for depression. As you recover from depression, you may start to: Have more interest in doing activities. Feel more hopeful. Have more energy. Eat a more regular amount of food. Have better mental focus. It is important to recognize if your depression is not getting better or is getting worse. The symptoms you had in the beginning may return, such as: Feeling tired. Eating too much or too little. Sleeping too much  or too little. Feeling restless, agitated, or hopeless. Trouble focusing or making decisions. Having unexplained aches and pains. Feeling irritable, angry, or aggressive. If you or your family members notice these symptoms coming back, let your health care provider know  right away. Follow these instructions at home: Activity Try to get some form of exercise each day, such as walking. Try yoga, mindfulness, or other stress reduction techniques. Participate in group activities if you are able. Lifestyle Get enough sleep. Cut down on or stop using caffeine, tobacco, alcohol, and any other harmful substances. Eat a healthy diet that includes plenty of vegetables, fruits, whole grains, low-fat dairy products, and lean protein. Limit foods that are high in solid fats, added sugar, or salt (sodium). General instructions Take over-the-counter and prescription medicines only as told by your health care provider. Keep all follow-up visits. It is important for your health care provider to check on your mood, behavior, and medicines. Your health care provider may need to make changes to your treatment. Where to find support Talking to others  Friends and family members can be sources of support and guidance. Talk to trusted friends or family members about your condition. Explain your symptoms and let them know that you are working with a health care provider to treat your depression. Tell friends and family how they can help. Finances Find mental health providers that fit with your financial situation. Talk with your health care provider if you are worried about access to food, housing, or medicine. Call your insurance company to learn about your co-pays and prescription plan. Where to find more information You can find support in your area from: Anxiety and Depression Association of America (ADAA): adaa.org Mental Health America: mentalhealthamerica.net [employer] on Mental Illness: nami.org Contact a health care provider if: You stop taking your antidepressant medicines, and you have any of these symptoms: Nausea. Headache. Light-headedness. Chills and body aches. Not being able to sleep (insomnia). You or your friends and family think your depression  is getting worse. Get help right away if: You have thoughts of hurting yourself or others. Get help right away if you feel like you may hurt yourself or others, or have thoughts about taking your own life. Go to your nearest emergency room or: Call 911. Call the National Suicide Prevention Lifeline at (680)560-7970 or 988. This is open 24 hours a day. Text the Crisis Text Line at 3092098538. This information is not intended to replace advice given to you by your health care provider. Make sure you discuss any questions you have with your health care provider. Document Revised: 03/05/2022 Document Reviewed: 03/05/2022 Elsevier Patient Education  2024 [employer].

## 2023-06-09 ENCOUNTER — Ambulatory Visit (INDEPENDENT_AMBULATORY_CARE_PROVIDER_SITE_OTHER): Payer: Medicare Other | Admitting: Nurse Practitioner

## 2023-06-09 ENCOUNTER — Encounter: Payer: Self-pay | Admitting: Nurse Practitioner

## 2023-06-09 VITALS — BP 132/74 | HR 75 | Temp 97.9°F | Ht 64.02 in | Wt 117.4 lb

## 2023-06-09 DIAGNOSIS — R5382 Chronic fatigue, unspecified: Secondary | ICD-10-CM

## 2023-06-09 DIAGNOSIS — F320 Major depressive disorder, single episode, mild: Secondary | ICD-10-CM | POA: Diagnosis not present

## 2023-06-09 DIAGNOSIS — R0602 Shortness of breath: Secondary | ICD-10-CM

## 2023-06-09 MED ORDER — BUPROPION HCL ER (XL) 300 MG PO TB24: 300.0000 mg | ORAL_TABLET | Freq: Every day | ORAL | 4 refills | Status: DC

## 2023-06-09 NOTE — Assessment & Plan Note (Signed)
Ongoing, all labs have been reassuring with exception of + ANA.  Saw rheumatology and autoimmune disorder not suspected.  Fatigue continues.  ?chronic fatigue syndrome or fibromyalgia.  Will increase Wellbutrin to 300 MG XL dosing, educated her on this.  She can take along with her Effexor daily.  Wellbutrin is offering some benefit to mood, but not fatigue at this time.  Will perform echo to check on heart as does report occasional SBE.  She declines sleep study.  Could consider end in future for low NA+, but this is not consistently low and varies.

## 2023-06-09 NOTE — Assessment & Plan Note (Signed)
Chronic, stable with current Effexor dose and Wellbutrin on board.  Reports some benefit from Wellbutrin.  Increase Wellbutrin XL to 300 MG daily and maintain remainder of regimen.  Denies SI/HI.

## 2023-06-09 NOTE — Progress Notes (Signed)
BP 132/74 (BP Location: Left Arm, Patient Position: Sitting, Cuff Size: Normal)   Pulse 75   Temp 97.9 F (36.6 C) (Oral)   Ht 5' 4.02" (1.626 m)   Wt 117 lb 6.4 oz (53.3 kg)   SpO2 96%   BMI 20.14 kg/m    Subjective:    Patient ID: Patty Cruz, female    DOB: 11-20-1955, 67 y.o.   MRN: [id number]  HPI: Patty Cruz is a 67 y.o. female  Chief Complaint  Patient presents with   Mood    Added Wellbutrin at last visit, patient states that she feels as if it is helping   Fatigue    Patient states that fatigue is still about the same, has not changed   DEPRESSION/ANXIETY Started on Wellbutrin XL 150 MG -- is helping with mood, but not energy level.   Mood status: stable Satisfied with current treatment?: yes Symptom severity: mild  Duration of current treatment : chronic Side effects: no Medication compliance: good compliance Psychotherapy/counseling: none Previous psychiatric medications: multiple medications Depressed mood:  not very much Anxious mood: no Anhedonia: no Significant weight loss or gain: no Insomnia: none Fatigue: yes ongoing Feelings of worthlessness or guilt: no Impaired concentration/indecisiveness: no Suicidal ideations: no Hopelessness: no Crying spells: no    06/09/2023   11:23 AM 01/23/2023    9:26 AM 04/23/2021    1:58 PM 10/11/2020   10:38 AM 08/16/2020    4:06 PM  Depression screen PHQ 2/9  Decreased Interest 1 1 0 1 1  Down, Depressed, Hopeless 0 1 0 2 1  PHQ - 2 Score 1 2 0 3 2  Altered sleeping 0 1 3 2  0  Tired, decreased energy 3 3 3 3 2   Change in appetite 0 1 1 0 0  Feeling bad or failure about yourself  0 0 0 1 0  Trouble concentrating 1 0 0 0 0  Moving slowly or fidgety/restless 0 0 0 0 0  Suicidal thoughts 0 0 0 0 0  PHQ-9 Score 5 7 7 9 4   Difficult doing work/chores Somewhat difficult Somewhat difficult  Somewhat difficult Not difficult at all       06/09/2023   11:23 AM 01/23/2023    9:26 AM 08/16/2020    4:06 PM  04/28/2019    9:55 AM  GAD 7 : Generalized Anxiety Score  Nervous, Anxious, on Edge 0 0 0 1  Control/stop worrying 0 0 0 0  Worry too much - different things 0 0 0 0  Trouble relaxing 0 0 0 1  Restless 0 0 0 0  Easily annoyed or irritable 0 0 0 0  Afraid - awful might happen 0 0 0 0  Total GAD 7 Score 0 0 0 2  Anxiety Difficulty Not difficult at all Not difficult at all Not difficult at all Somewhat difficult   FATIGUE Ongoing issue, Wellbutrin has not helped this.  Has started walking again.  Works at [employer] 6 to 10 hours a day.    Saw rheumatology on 05/26/23 they do not have concern for autoimmune disease, but do recommend a sleep study or possible endocrinology if ongoing low sodium levels.  Is starting with physical therapy on the 12th per rheumatology recommendations for chronic neck pain -- history of fracture to neck in 2010. Duration:  chronic Severity: 5/10  Onset: gradual Context when symptoms started:  unknown Symptoms improve with rest:  never feels rested   Depressive symptoms:  yes -- improving with Wellbutrin Stress/anxiety: no Insomnia: none Snoring: no Observed apnea by bed partner: unknown Daytime hypersomnolence:yes Wakes feeling refreshed: yes History of sleep study: no Dysnea on exertion:  yes with mowing lawn at times Orthopnea/PND: no Chest pain: no Chronic cough: no Lower extremity edema: no Arthralgias:yes occasional Myalgias: no Weakness: no Rash: no   Relevant past medical, surgical, family and social history reviewed and updated as indicated. Interim medical history since our last visit reviewed. Allergies and medications reviewed and updated.  Review of Systems  Constitutional:  Positive for fatigue. Negative for activity change, appetite change, diaphoresis and fever.  Respiratory:  Negative for cough, chest tightness and shortness of breath.   Cardiovascular:  Negative for chest pain, palpitations and leg swelling.   Musculoskeletal:  Positive for arthralgias.  Neurological: Negative.   Psychiatric/Behavioral:  Negative for decreased concentration, self-injury, sleep disturbance and suicidal ideas. The patient is not nervous/anxious (improving).     Per HPI unless specifically indicated above     Objective:    BP 132/74 (BP Location: Left Arm, Patient Position: Sitting, Cuff Size: Normal)   Pulse 75   Temp 97.9 F (36.6 C) (Oral)   Ht 5' 4.02" (1.626 m)   Wt 117 lb 6.4 oz (53.3 kg)   SpO2 96%   BMI 20.14 kg/m   Wt Readings from Last 3 Encounters:  06/09/23 117 lb 6.4 oz (53.3 kg)  04/16/23 115 lb (52.2 kg)  02/24/23 114 lb 8 oz (51.9 kg)    Physical Exam Vitals and nursing note reviewed.  Constitutional:      General: She is awake. She is not in acute distress.    Appearance: She is well-developed and well-groomed. She is not ill-appearing or toxic-appearing.  HENT:     Head: Normocephalic.     Right Ear: Hearing normal.     Left Ear: Hearing normal.  Eyes:     General: Lids are normal.        Right eye: No discharge.        Left eye: No discharge.     Extraocular Movements: Extraocular movements intact.     Conjunctiva/sclera: Conjunctivae normal.     Visual Fields: Right eye visual fields normal and left eye visual fields normal.  Pulmonary:     Effort: Pulmonary effort is normal. No accessory muscle usage or respiratory distress.  Musculoskeletal:     Cervical back: Normal range of motion.  Neurological:     Mental Status: She is alert and oriented to person, place, and time.     Sensory: Sensation is intact.     Motor: Motor function is intact.     Coordination: Coordination is intact.     Gait: Gait is intact.     Deep Tendon Reflexes: Reflexes are normal and symmetric.     Reflex Scores:      Brachioradialis reflexes are 2+ on the right side and 2+ on the left side.      Patellar reflexes are 2+ on the right side and 2+ on the left side. Psychiatric:        Attention  and Perception: Attention normal.        Mood and Affect: Mood normal.        Behavior: Behavior normal. Behavior is cooperative.        Thought Content: Thought content normal.        Judgment: Judgment normal.    Results for orders placed or performed in visit on 01/23/23  T4, free  Result Value Ref Range   Free T4 1.08 0.82 - 1.77 ng/dL  Thyroid peroxidase antibody  Result Value Ref Range   Thyroperoxidase Ab SerPl-aCnc 12 0 - 34 IU/mL  CBC with Differential/Platelet  Result Value Ref Range   WBC 7.0 3.4 - 10.8 x10E3/uL   RBC 4.21 3.77 - 5.28 x10E6/uL   Hemoglobin 13.3 11.1 - 15.9 g/dL   Hematocrit 53.6 64.4 - 46.6 %   MCV 95 79 - 97 fL   MCH 31.6 26.6 - 33.0 pg   MCHC 33.4 31.5 - 35.7 g/dL   RDW 03.4 74.2 - 59.5 %   Platelets 344 150 - 450 x10E3/uL   Neutrophils 72 Not Estab. %   Lymphs 16 Not Estab. %   Monocytes 8 Not Estab. %   Eos 3 Not Estab. %   Basos 1 Not Estab. %   Neutrophils Absolute 5.1 1.4 - 7.0 x10E3/uL   Lymphocytes Absolute 1.1 0.7 - 3.1 x10E3/uL   Monocytes Absolute 0.5 0.1 - 0.9 x10E3/uL   EOS (ABSOLUTE) 0.2 0.0 - 0.4 x10E3/uL   Basophils Absolute 0.1 0.0 - 0.2 x10E3/uL   Immature Granulocytes 0 Not Estab. %   Immature Grans (Abs) 0.0 0.0 - 0.1 x10E3/uL  Comprehensive metabolic panel  Result Value Ref Range   Glucose 89 70 - 99 mg/dL   BUN 11 8 - 27 mg/dL   Creatinine, Ser 6.38 0.57 - 1.00 mg/dL   eGFR 97 >75 IE/PPI/9.51   BUN/Creatinine Ratio 17 12 - 28   Sodium 136 134 - 144 mmol/L   Potassium 4.3 3.5 - 5.2 mmol/L   Chloride 96 96 - 106 mmol/L   CO2 22 20 - 29 mmol/L   Calcium 9.7 8.7 - 10.3 mg/dL   Total Protein 7.6 6.0 - 8.5 g/dL   Albumin 5.1 (H) 3.9 - 4.9 g/dL   Globulin, Total 2.5 1.5 - 4.5 g/dL   Albumin/Globulin Ratio 2.0 1.2 - 2.2   Bilirubin Total 0.3 0.0 - 1.2 mg/dL   Alkaline Phosphatase 118 44 - 121 IU/L   AST 32 0 - 40 IU/L   ALT 25 0 - 32 IU/L  Lipid Panel w/o Chol/HDL Ratio  Result Value Ref Range   Cholesterol, Total  233 (H) 100 - 199 mg/dL   Triglycerides 68 0 - 149 mg/dL   HDL 94 >88 mg/dL   VLDL Cholesterol Cal 12 5 - 40 mg/dL   LDL Chol Calc (NIH) 416 (H) 0 - 99 mg/dL  TSH  Result Value Ref Range   TSH 4.170 0.450 - 4.500 uIU/mL  VITAMIN D 25 Hydroxy (Vit-D Deficiency, Fractures)  Result Value Ref Range   Vit D, 25-Hydroxy 51.5 30.0 - 100.0 ng/mL  Vitamin B12  Result Value Ref Range   Vitamin B-12 >2000 (H) 232 - 1245 pg/mL  Magnesium  Result Value Ref Range   Magnesium 2.2 1.6 - 2.3 mg/dL  Lipase  Result Value Ref Range   Lipase 29 14 - 72 U/L  Amylase  Result Value Ref Range   Amylase 65 31 - 110 U/L  Lyme disease, western blot  Result Value Ref Range     IgG P93 Ab. Absent      IgG P66 Ab. Absent      IgG P58 Ab. Absent      IgG P45 Ab. Present (A)      IgG P41 Ab. Present (A)      IgG P39 Ab. Absent  IgG P30 Ab. Absent      IgG P28 Ab. Absent      IgG P23 Ab. Absent      IgG P18 Ab. Absent    Lyme IgG Wb Negative      IgM P41 Ab. Absent      IgM P39 Ab. Absent      IgM P23 Ab. Absent    Lyme IgM Wb Negative   ANA 12 Plus Profile (RDL)  Result Value Ref Range   Anti-Nuclear Ab by IFA (RDL) Positive (A) Negative  Sedimentation rate  Result Value Ref Range   Sed Rate 8 0 - 40 mm/hr  C-reactive protein  Result Value Ref Range   CRP <1 0 - 10 mg/L  ANA 12 Plus Profile, Positive  Result Value Ref Range   Speckled Pattern 1:160 (H) <1:40   Note: Comment    Anti-Centromere Ab (RDL) <1:40 <1:40   Anti-dsDNA Ab by Farr(RDL) <8.0 <8.0 IU/mL   Anti-Sm Ab (RDL) <20 <20 Units   Anti-U1 RNP Ab (RDL) <20 <20 Units   Anti-Ro (SS-A) Ab (RDL) <20 <20 Units   Anti-La (SS-B) Ab (RDL) <20 <20 Units   Anti-Scl-70 Ab (RDL) <20 <20 Units   Anti-Cardiolipin Ab, IgG (RDL) <15 <15 GPL U/mL   Anti-Cardiolipin Ab, IgA (RDL) <12 <12 APL U/mL   Anti-Cardiolipin Ab, IgM (RDL) <13 <13 MPL U/mL   C3 Complement (RDL) 120 82 - 167 mg/dL   C4 Complement (RDL) 17 14 - 44 mg/dL   Anti-TPO Ab  (RDL) CANCELED IU/mL   Anti-Chromatin Ab, IgG (RDL) <20 <20 Units   Anti-CCP Ab, IgG & IgA (RDL) <20 <20 Units   Rheumatoid Factor by Turb RDL <14 <14 IU/mL   ANA Plus 12 Interpretation Comment       Assessment & Plan:   Problem List Items Addressed This Visit       Other   Chronic fatigue    Ongoing, all labs have been reassuring with exception of + ANA.  Saw rheumatology and autoimmune disorder not suspected.  Fatigue continues.  ?chronic fatigue syndrome or fibromyalgia.  Will increase Wellbutrin to 300 MG XL dosing, educated her on this.  She can take along with her Effexor daily.  Wellbutrin is offering some benefit to mood, but not fatigue at this time.  Will perform echo to check on heart as does report occasional SBE.  She declines sleep study.  Could consider end in future for low NA+, but this is not consistently low and varies.      Depression, major, single episode, mild (HCC) - Primary    Chronic, stable with current Effexor dose and Wellbutrin on board.  Reports some benefit from Wellbutrin.  Increase Wellbutrin XL to 300 MG daily and maintain remainder of regimen.  Denies SI/HI.         Relevant Medications   buPROPion (WELLBUTRIN XL) 300 MG 24 hr tablet   Other Visit Diagnoses     SOB (shortness of breath) on exertion       Order for echo placed to further assess.   Relevant Orders   ECHOCARDIOGRAM COMPLETE        Follow up plan: Return in about 6 weeks (around 07/21/2023) for MOOD -- increased Wellbutrin to 300 MG XL + Neck Pain.

## 2023-06-13 ENCOUNTER — Ambulatory Visit: Payer: Medicare Other | Admitting: Nurse Practitioner

## 2023-06-18 ENCOUNTER — Other Ambulatory Visit: Payer: Self-pay | Admitting: Nurse Practitioner

## 2023-06-19 NOTE — Telephone Encounter (Signed)
Request is too soon for refill. Last refill 02/24/23 for 90 and 4 refills.  Requested Prescriptions  Pending Prescriptions Disp Refills   venlafaxine (EFFEXOR) 25 MG tablet [Pharmacy Med Name: VENLAFAXINE HCL 25 MG TAB] 180 tablet     Sig: TAKE 2 TABLETS BY MOUTH ONCE DAILY     Psychiatry: Antidepressants - SNRI - desvenlafaxine & venlafaxine Failed - 06/18/2023  2:48 PM      Failed - Lipid Panel in normal range within the last 12 months    Cholesterol, Total  Date Value Ref Range Status  01/23/2023 233 (H) 100 - 199 mg/dL Final   LDL Chol Calc (NIH)  Date Value Ref Range Status  01/23/2023 127 (H) 0 - 99 mg/dL Final   HDL  Date Value Ref Range Status  01/23/2023 94 >39 mg/dL Final   Triglycerides  Date Value Ref Range Status  01/23/2023 68 0 - 149 mg/dL Final         Passed - Cr in normal range and within 360 days    Creatinine  Date Value Ref Range Status  11/22/2014 0.54 (L) 0.60 - 1.30 mg/dL Final   Creatinine, Ser  Date Value Ref Range Status  01/23/2023 0.63 0.57 - 1.00 mg/dL Final         Passed - Completed PHQ-2 or PHQ-9 in the last 360 days      Passed - Last BP in normal range    BP Readings from Last 1 Encounters:  06/09/23 132/74         Passed - Valid encounter within last 6 months    Recent Outpatient Visits           1 week ago Depression, major, single episode, mild (HCC)   [place] [facility] [place], [place] T, NP   3 months ago Chronic tension-type headache, not intractable   [place] [facility] [place], [place] T, NP   4 months ago Medicare annual wellness visit, initial   [place] [facility] [place], [place] T, NP   2 years ago Depression, major, single episode, mild (HCC)   [place] [facility] [place], [place] T, NP   2 years ago Depression, major, single episode, mild (HCC)   [place] [facility] [place], Dorie Rank, NP       Future Appointments              In 1 month [place], Dorie Rank, NP [place] [facility]

## 2023-06-23 DIAGNOSIS — M542 Cervicalgia: Secondary | ICD-10-CM | POA: Diagnosis not present

## 2023-06-23 DIAGNOSIS — R293 Abnormal posture: Secondary | ICD-10-CM | POA: Diagnosis not present

## 2023-06-30 ENCOUNTER — Encounter: Payer: Self-pay | Admitting: Nurse Practitioner

## 2023-07-10 DIAGNOSIS — M2012 Hallux valgus (acquired), left foot: Secondary | ICD-10-CM | POA: Diagnosis not present

## 2023-07-10 DIAGNOSIS — M2042 Other hammer toe(s) (acquired), left foot: Secondary | ICD-10-CM | POA: Diagnosis not present

## 2023-07-21 ENCOUNTER — Ambulatory Visit: Payer: Medicare Other | Admitting: Nurse Practitioner

## 2023-07-21 DIAGNOSIS — M542 Cervicalgia: Secondary | ICD-10-CM | POA: Diagnosis not present

## 2023-07-21 DIAGNOSIS — R293 Abnormal posture: Secondary | ICD-10-CM | POA: Diagnosis not present

## 2023-07-23 ENCOUNTER — Ambulatory Visit
Admission: RE | Admit: 2023-07-23 | Discharge: 2023-07-23 | Disposition: A | Discharge status: Home / Self Care | Payer: Medicare Other | Source: Ambulatory Visit | Attending: Nurse Practitioner | Admitting: Nurse Practitioner

## 2023-07-23 DIAGNOSIS — F411 Generalized anxiety disorder: Secondary | ICD-10-CM | POA: Insufficient documentation

## 2023-07-23 DIAGNOSIS — F1010 Alcohol abuse, uncomplicated: Secondary | ICD-10-CM | POA: Insufficient documentation

## 2023-07-23 DIAGNOSIS — I339 Acute and subacute endocarditis, unspecified: Secondary | ICD-10-CM | POA: Diagnosis not present

## 2023-07-23 DIAGNOSIS — I5189 Other ill-defined heart diseases: Secondary | ICD-10-CM

## 2023-07-23 DIAGNOSIS — R0602 Shortness of breath: Secondary | ICD-10-CM | POA: Insufficient documentation

## 2023-07-23 HISTORY — DX: Other ill-defined heart diseases: I51.89

## 2023-07-23 LAB — ECHOCARDIOGRAM COMPLETE
AR max vel: 2.51 cm2
AV Area VTI: 3.08 cm2
AV Area mean vel: 2.53 cm2
AV Mean grad: 3 mmHg
AV Peak grad: 5 mmHg
Ao pk vel: 1.12 m/s
Area-P 1/2: 3.93 cm2
MV VTI: 2.55 cm2
S' Lateral: 2.3 cm

## 2023-07-23 NOTE — Progress Notes (Signed)
*  PRELIMINARY RESULTS* Echocardiogram 2D Echocardiogram has been performed.  Cristela Blue 07/23/2023, 11:39 AM

## 2023-07-23 NOTE — Progress Notes (Signed)
Contacted via MyChart   Good afternoon Patty Cruz, your echo has returned and overall is very reassuring.  You have a good pump with ejection fraction at 60-65% and left ventricle has normal motion.  There is some mild impairment in relaxation of left ventricle, but this is only mild and should not be causing any symptoms. Remainder of heart looks great.  Any questions?

## 2023-07-25 ENCOUNTER — Encounter: Payer: Self-pay | Admitting: Nurse Practitioner

## 2023-07-25 DIAGNOSIS — M542 Cervicalgia: Secondary | ICD-10-CM

## 2023-07-25 DIAGNOSIS — G44229 Chronic tension-type headache, not intractable: Secondary | ICD-10-CM

## 2023-07-29 ENCOUNTER — Ambulatory Visit: Payer: Medicare Other | Admitting: Nurse Practitioner

## 2023-07-29 NOTE — Progress Notes (Signed)
Referring Physician:  Marjie Skiff, NP [address] [place],  [place] [address]  Primary Physician:  Marjie Skiff, NP  History of Present Illness: 08/01/2023 Patty Cruz has a history of alcohol abuse in remission, chronic fatigue, depression. She has been sober since 2017.   Has seen rheumatology for positive ANA. He sent her to PT for her neck.   She has 4 month history of intermittent left sided neck pain with some radiation to her shoulder. When she looks to left, she feels a pop and this will "take my breath away." History of chronic headaches, but they have been more frequent. No arm pain. She has occasional tingling in her hands. No numbness or weakness noted.   No change with her 2 visits of PT. Saw a chiropractor in May and June- this made her pain worse. She takes motrin with some relief.   Bowel/Bladder Dysfunction: none  Conservative measures:  Physical therapy: has done 2 visits of PT for her neck, last was on 07/21/23 Multimodal medical therapy including regular antiinflammatories: percocet, motrin Injections: no epidural steroid injections  Past Surgery: no spinal surgery  Patty Cruz has no symptoms of cervical myelopathy. She has intermittent balance issues. No dexterity issues.   The symptoms are causing a significant impact on the patient's life.   Review of Systems:  A 10 point review of systems is negative, except for the pertinent positives and negatives detailed in the HPI.  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   Chronic fatigue    Chronic tension type headache    Depression    Frontal headache 02/02/2020   decreased  since taking Amitriptyline, every 3 or 4 days, not as intense as before   Motion sickness    Vertigo     Past Surgical History: Past Surgical History:  Procedure Laterality Date   AIKEN OSTEOTOMY Left 04/16/2023   Procedure: 28298 PHALANX OSTEOTOMY - AKIN;  Surgeon: Gwyneth Revels, DPM;  Location[facility];  Service: Podiatry;  Laterality: Left;   BUNIONECTOMY Left 04/16/2023   Procedure: LAPIDUS - TYPE;  Surgeon: Gwyneth Revels, DPM;  Location: [facility];  Service: Podiatry;  Laterality: Left;   CARPAL TUNNEL RELEASE Right    CESAREAN SECTION  1991   COLONOSCOPY WITH PROPOFOL N/A 02/17/2023   Procedure: COLONOSCOPY WITH PROPOFOL;  Surgeon: Wyline Mood, MD;  Location: [facility];  Service: Gastroenterology;  Laterality: N/A;   FOOT SURGERY Right    HAMMER TOE SURGERY Left 04/16/2023   Procedure: HAMMER TOE CORRECTION - SECOND;  Surgeon: Gwyneth Revels, DPM;  Location: [facility];  Service: Podiatry;  Laterality: Left;   TONSILLECTOMY     WEIL OSTEOTOMY Left 04/16/2023   Procedure: 28308 WEIL - SECOND;  Surgeon: Gwyneth Revels, DPM;  Location: [facility];  Service: Podiatry;  Laterality: Left;   WRIST SURGERY Right     Allergies: Allergies as of 08/01/2023 - Review Complete 08/01/2023  Allergen Reaction Noted   Tramadol Nausea Only 09/29/2013   Trazodone and nefazodone Nausea And Vomiting 12/27/2014    Medications: Outpatient Encounter Medications as of 08/01/2023  Medication Sig   amitriptyline (ELAVIL) 10 MG  tablet Take 1 tablet (10 mg total) by mouth at bedtime.   Ascorbic Acid (VITAMIN C PO) Take by mouth daily.   buPROPion (WELLBUTRIN XL) 300 MG 24 hr tablet Take 1 tablet (300 mg total) by mouth daily.   Cyanocobalamin (VITAMIN B 12 PO) Take by mouth daily.   Ferrous Sulfate (IRON PO) Take by mouth daily.   Multiple Vitamins-Minerals (TGT MULTIVITAMIN/MULTIMINERAL) TABS    ondansetron (ZOFRAN) 4 MG tablet Take 1 tablet (4 mg total) by  mouth every 8 (eight) hours as needed for nausea or vomiting.   sodium chloride 1 g tablet Take 1 g by mouth daily.   venlafaxine (EFFEXOR) 50 MG tablet Take 1 tablet (50 mg total) by mouth daily.   VITAMIN D PO Take by mouth daily.   No facility-administered encounter medications on file as of 08/01/2023.    Social History: Social History   Tobacco Use   Smoking status: Never   Smokeless tobacco: Never  Vaping Use   Vaping status: Never Used  Substance Use Topics   Alcohol use: Not Currently   Drug use: No    Family Medical History: Family History  Problem Relation Age of Onset   Cancer Mother        lung, pancrease, and stomach   Cancer Maternal Grandmother        breast   Breast cancer Maternal Grandmother    Cancer Paternal Grandmother        liver   Alcohol abuse Brother     Physical Examination: Vitals:   08/01/23 1116  BP: 112/74    General: Patient is well developed, well nourished, calm, collected, and in no apparent distress. Attention to examination is appropriate.  Respiratory: Patient is breathing without any difficulty.   NEUROLOGICAL:     Awake, alert, oriented to person, place, and time.  Speech is clear and fluent. Fund of knowledge is appropriate.   Cranial Nerves: Pupils equal round and reactive to light.  Facial tone is symmetric.    No posterior cervical tenderness. No tenderness in bilateral trapezial region.   Good ROM of shoulders with no pain.   No abnormal lesions on exposed skin.   Strength: Side Biceps Triceps Deltoid Interossei Grip Wrist Ext. Wrist Flex.  R 5 5 5 5 5 5 5   L 5 5 5 5 5 5 5    Side Iliopsoas Quads Hamstring PF DF EHL  R 5 5 5 5 5 5   L 5 5 5 5 5 5    Reflexes are 3+ and symmetric at the biceps, brachioradialis, patella and achilles.   Hoffman's is positive bilaterally.  Clonus is not present.   Bilateral upper and lower extremity sensation is intact to light touch.     Gait is normal.     Medical Decision  Making  Imaging: No recent cervical imaging.   Assessment and Plan: Patty Cruz is a pleasant 67 y.o. female who has a 4 month history of intermittent left sided neck pain with some radiation to her shoulder. When she looks to left, she feels a pop and this will "take my breath away." History of chronic headaches, but they have been more frequent. No arm pain.   No cervical imaging. She is hyperreflexic and had positive hoffmans bilaterally. Some balance issues. No dexterity issues.   Treatment options discussed with patient and following plan made:   - MRI of cervical spine ordered. She is hyperreflexic and had positive hoffmans bilaterally. Some balance issues. No dexterity issues.  She request WIDE BORE MRI at [facility].  - Will get cervical xrays as well with flexion/extension.  - Okay to hold on PT for now. May revisit after imaging.  - Will schedule phone visit to review MRI results once I get them back.   I spent a total of 30 minutes in face-to-face and non-face-to-face activities related to this patient's care today including review of outside records, review of imaging, review of symptoms, physical exam, discussion of differential diagnosis, discussion of treatment options, and documentation.   Thank you for involving me in the care of this patient.   Drake Leach PA-C Dept. of Neurosurgery

## 2023-08-01 ENCOUNTER — Ambulatory Visit: Payer: Medicare Other | Admitting: Orthopedic Surgery

## 2023-08-01 ENCOUNTER — Encounter: Payer: Self-pay | Admitting: Orthopedic Surgery

## 2023-08-01 VITALS — BP 112/74 | Ht 65.0 in | Wt 114.0 lb

## 2023-08-01 DIAGNOSIS — M542 Cervicalgia: Secondary | ICD-10-CM

## 2023-08-01 DIAGNOSIS — R2689 Other abnormalities of gait and mobility: Secondary | ICD-10-CM | POA: Diagnosis not present

## 2023-08-01 NOTE — Patient Instructions (Signed)
It was so nice to see you today. Thank you so much for coming in.    I want to get an MRI of your neck to look into things further. We will get this approved through your insurance and [facility] will call you to schedule the appointment. Make sure you are scheduled for the WIDE BORE MRI. Remind them to do xrays at that visit as well.   After you have the MRI, it takes 10-14 days for me to get the results back. Once I have them, we will call you to schedule a follow up phone visit with me to review them.   If you decide you want to see neurology for your headaches then let me know.   Please do not hesitate to call if you have any questions or concerns. You can also message me in MyChart.   Drake Leach PA-C [phone]

## 2023-08-20 ENCOUNTER — Other Ambulatory Visit: Payer: Medicare Other

## 2023-08-20 ENCOUNTER — Ambulatory Visit: Admission: RE | Admit: 2023-08-20 | Payer: Medicare Other | Source: Ambulatory Visit

## 2023-08-21 ENCOUNTER — Ambulatory Visit
Admission: RE | Admit: 2023-08-21 | Discharge: 2023-08-21 | Disposition: A | Discharge status: Home / Self Care | Payer: Medicare Other | Source: Ambulatory Visit | Attending: Orthopedic Surgery | Admitting: Orthopedic Surgery

## 2023-08-21 ENCOUNTER — Ambulatory Visit
Admission: RE | Admit: 2023-08-21 | Discharge: 2023-08-21 | Discharge status: Home / Self Care | Payer: Medicare Other | Source: Ambulatory Visit | Attending: Orthopedic Surgery | Admitting: Orthopedic Surgery

## 2023-08-21 DIAGNOSIS — M542 Cervicalgia: Secondary | ICD-10-CM

## 2023-08-21 DIAGNOSIS — G8929 Other chronic pain: Secondary | ICD-10-CM | POA: Diagnosis not present

## 2023-08-21 DIAGNOSIS — M4312 Spondylolisthesis, cervical region: Secondary | ICD-10-CM | POA: Diagnosis not present

## 2023-08-21 DIAGNOSIS — S12030A Displaced posterior arch fracture of first cervical vertebra, initial encounter for closed fracture: Secondary | ICD-10-CM | POA: Diagnosis not present

## 2023-08-21 DIAGNOSIS — M50223 Other cervical disc displacement at C6-C7 level: Secondary | ICD-10-CM | POA: Diagnosis not present

## 2023-08-21 DIAGNOSIS — M47812 Spondylosis without myelopathy or radiculopathy, cervical region: Secondary | ICD-10-CM | POA: Diagnosis not present

## 2023-08-21 DIAGNOSIS — M5030 Other cervical disc degeneration, unspecified cervical region: Secondary | ICD-10-CM | POA: Diagnosis not present

## 2023-09-15 ENCOUNTER — Encounter: Payer: Self-pay | Admitting: Nurse Practitioner

## 2023-09-15 ENCOUNTER — Other Ambulatory Visit: Payer: Self-pay | Admitting: Nurse Practitioner

## 2023-09-15 NOTE — Telephone Encounter (Signed)
Please call her to schedule phone visit with me to review her cervical MRI/xrays.

## 2023-09-15 NOTE — Telephone Encounter (Signed)
Left voicemail for patient to call the office 

## 2023-09-16 NOTE — Telephone Encounter (Signed)
Requested Prescriptions  Pending Prescriptions Disp Refills   venlafaxine (EFFEXOR) 25 MG tablet [Pharmacy Med Name: VENLAFAXINE HCL 25 MG TAB] 180 tablet     Sig: TAKE 2 TABLETS BY MOUTH ONCE DAILY     Psychiatry: Antidepressants - SNRI - desvenlafaxine & venlafaxine Failed - 09/15/2023 11:19 AM      Failed - Lipid Panel in normal range within the last 12 months    Cholesterol, Total  Date Value Ref Range Status  01/23/2023 233 (H) 100 - 199 mg/dL Final   LDL Chol Calc (NIH)  Date Value Ref Range Status  01/23/2023 127 (H) 0 - 99 mg/dL Final   HDL  Date Value Ref Range Status  01/23/2023 94 >39 mg/dL Final   Triglycerides  Date Value Ref Range Status  01/23/2023 68 0 - 149 mg/dL Final         Passed - Cr in normal range and within 360 days    Creatinine  Date Value Ref Range Status  11/22/2014 0.54 (L) 0.60 - 1.30 mg/dL Final   Creatinine, Ser  Date Value Ref Range Status  01/23/2023 0.63 0.57 - 1.00 mg/dL Final         Passed - Completed PHQ-2 or PHQ-9 in the last 360 days      Passed - Last BP in normal range    BP Readings from Last 1 Encounters:  08/01/23 112/74         Passed - Valid encounter within last 6 months    Recent Outpatient Visits           3 months ago Depression, major, single episode, mild (HCC)   [place] [facility] [place], [place] T, NP   6 months ago Chronic tension-type headache, not intractable   [place] [facility] [place], [place] T, NP   7 months ago Medicare annual wellness visit, initial   [place] [facility] [place], [place] T, NP   2 years ago Depression, major, single episode, mild (HCC)   [place] [facility] [place], [place] T, NP   2 years ago Depression, major, single episode, mild (HCC)    [facility] [place], Dorie Rank, NP

## 2023-09-16 NOTE — Progress Notes (Unsigned)
Telephone Visit- Progress Note: Referring Physician:  No referring provider defined for this encounter.  Primary Physician:  Patty Skiff, NP  This visit was performed via telephone.  Patient location: home Provider location: office  I spent a total of 15 minutes non-face-to-face activities for this visit on the date of this encounter including review of current clinical condition and response to treatment.    Patient has given verbal consent to this telephone visits and we reviewed the limitations of a telephone visit. Patient wishes to proceed.    Chief Complaint:  review MRI   History of Present Illness: Patty Cruz is a 67 y.o. female has a history of alcohol abuse in remission, chronic fatigue, depression. She has been sober since 2017.   Last seen by me on 08/01/23 for left sided neck pain. She was hyperreflexic and had positive hoffmans bilaterally. Some balance issues. No dexterity issues.   Phone visit scheduled to review her cervical MRI and xrays.   Has seen rheumatology for positive ANA. He sent her to PT for her neck.    She continues with intermittent left sided neck pain with some radiation to her shoulder. Her worst pain is when she has to look to left when driving, otherwise her pain is mostly tolerable. She has intermittent popping in her neck. No arm pain. She has occasional tingling in her hands/feet. No numbness or weakness noted. She is still having headaches.   No change with 2 visits of PT. Saw a chiropractor in May and June- this made her pain worse. She takes motrin with some relief.     Conservative measures:  Physical therapy: has done 2 visits of PT for her neck, last was on 07/21/23 Multimodal medical therapy including regular antiinflammatories: percocet, motrin Injections: no epidural steroid injections   Past Surgery: no spinal surgery   Patty Cruz has no symptoms of cervical myelopathy. She has intermittent balance issues. No  dexterity issues.    The symptoms are causing a significant impact on the patient's life.   Exam: No exam done as this was a telephone encounter.     Imaging: Cervical MRI dated 08/21/23:  FINDINGS: Alignment: Degenerative anterolisthesis at  C3-4 and C7-T1.   Vertebrae: Marrow edema on the left at C3-4. No acute fracture or aggressive bone lesion.   Cord: Normal signal and morphology.   Posterior Fossa, vertebral arteries, paraspinal tissues: Negative.   Disc levels:   C2-3: Facet spurring with ankylosis.  No neural impingement   C3-4: Advanced facet degeneration especially on the left where there is marrow edema and joint effusion with posterior synovial cyst. Uncovertebral and facet spurring causes advanced left foraminal impingement.   C4-5: Disc collapse with endplate ridging and uncovertebral spurring. Biforaminal impingement.   C5-6: Disc collapse with uncovertebral ridging and disc bulging. At least moderate biforaminal impingement.   C6-7: Disc narrowing and bulging with uncovertebral spurring. Facet spurring and ligamentum flavum thickening. Biforaminal impingement.   C7-T1:Mild facet spurring and disc bulging.  No neural impingement.   IMPRESSION: 1. Generalized cervical spine degeneration with levels of anterolisthesis. Active facet arthritis on the left at C3-4. 2. Foraminal impingement on the left at C3-4 and bilaterally at C4-5 to C6-7. 3. Diffusely patent spinal canal.     Electronically Signed   By: Tiburcio Pea M.D.   On: 09/14/2023 04:23   Cervical xrays dated 08/21/23:  FINDINGS: Old fracture posterior ring C1. Odontoid process fracture observed on prior CT is not evident  on plain film. There is degenerative disc disease with loss of disc space and marginal osteophyte formation C4-C7. Prevertebral and cervicocranial soft tissues are unremarkable.   IMPRESSION: Degenerative changes. Chronic posttraumatic changes at  the cervicocranium.     Electronically Signed   By: Layla Maw M.D.   On: 09/13/2023 11:56    I have personally reviewed the images and agree with the above interpretation.  Assessment and Plan: [name] is a pleasant 67 y.o. female who continues with intermittent left sided neck pain with some radiation to her shoulder. Her worst pain is when she has to look to left when driving, otherwise her pain is mostly tolerable. No arm pain.   She has known slip at C3-C4 significant facet hypertrophy and posterior synovial cyst on left along with left foraminal stenosis. She has cervical spndylosis/DDD C4-C7 with bilateral foraminal stenosis. Slip at C7-T1 as well. No gross instability noted. Known old fracture posterior ring C1 and odontoid as well. No cervical stenosis.   Left sided neck pain likely from C3-C4 and underlying spondylosis/DDD is likely contributing as well.  Treatment options discussed with patient and following plan made:   - Referral to pain management (Lateef) to discuss cervical injections.  - Discussed revisiting PT for cervical spine. She declines for now. May revisit after injections if pain is improved.  - Headaches not likely cervical mediated. Will follow up with PCP.  - Follow up with me in 6-8 weeks for recheck.   Drake Leach PA-C Neurosurgery

## 2023-09-18 ENCOUNTER — Encounter: Payer: Self-pay | Admitting: Orthopedic Surgery

## 2023-09-18 ENCOUNTER — Ambulatory Visit: Payer: Medicare Other | Admitting: Orthopedic Surgery

## 2023-09-18 DIAGNOSIS — M4802 Spinal stenosis, cervical region: Secondary | ICD-10-CM

## 2023-09-18 DIAGNOSIS — M5030 Other cervical disc degeneration, unspecified cervical region: Secondary | ICD-10-CM

## 2023-09-18 DIAGNOSIS — M5031 Other cervical disc degeneration,  high cervical region: Secondary | ICD-10-CM

## 2023-09-18 DIAGNOSIS — M47812 Spondylosis without myelopathy or radiculopathy, cervical region: Secondary | ICD-10-CM

## 2023-09-18 DIAGNOSIS — M4312 Spondylolisthesis, cervical region: Secondary | ICD-10-CM

## 2023-09-24 NOTE — Telephone Encounter (Signed)
Referral put in for Dr. Retia Passe.   Please cancel referral for pain management (Lateef).

## 2023-10-23 DIAGNOSIS — M47812 Spondylosis without myelopathy or radiculopathy, cervical region: Secondary | ICD-10-CM | POA: Diagnosis not present

## 2023-11-17 ENCOUNTER — Ambulatory Visit: Payer: Medicare Other | Admitting: Orthopedic Surgery

## 2023-11-26 DIAGNOSIS — M2550 Pain in unspecified joint: Secondary | ICD-10-CM | POA: Diagnosis not present

## 2023-11-26 DIAGNOSIS — R5382 Chronic fatigue, unspecified: Secondary | ICD-10-CM | POA: Diagnosis not present

## 2023-11-26 DIAGNOSIS — R768 Other specified abnormal immunological findings in serum: Secondary | ICD-10-CM | POA: Diagnosis not present

## 2023-11-27 DIAGNOSIS — M2550 Pain in unspecified joint: Secondary | ICD-10-CM | POA: Diagnosis not present

## 2023-12-15 ENCOUNTER — Ambulatory Visit: Payer: Medicare Other | Admitting: Orthopedic Surgery

## 2023-12-17 ENCOUNTER — Encounter: Payer: Self-pay | Admitting: Nurse Practitioner

## 2023-12-22 DIAGNOSIS — M47812 Spondylosis without myelopathy or radiculopathy, cervical region: Secondary | ICD-10-CM | POA: Diagnosis not present

## 2024-01-02 NOTE — Progress Notes (Addendum)
 Referring Physician:  No referring provider defined for this encounter.  Primary Physician:  Marjie Skiff, NP  History of Present Illness: 01/05/2024 [name] has a history of alcohol abuse in remission, chronic fatigue, depression. She has been sober since 2017.   I did phone visit with her on 09/18/23. She has known  slip at C3-C4 significant facet hypertrophy and posterior synovial cyst on left along with left foraminal stenosis. She has cervical spndylosis/DDD C4-C7 with bilateral foraminal stenosis. Slip at C7-T1 as well. No gross instability noted. Known old fracture posterior ring C1 and odontoid as well. No cervical stenosis.    Left sided neck pain likely from C3-C4 and underlying spondylosis/DDD is likely contributing as well.  She was to follow up with her PCP for her headaches. She saw Dr. Retia Passe and had left C3 and C4 MBB on 12/22/23. She declined PT.   She is here for follow up.   She continues with intermittent left sided neck pain that is worse with turning her head. She did not see much improvement with the injection. No arm pain. She has occasional tingling in her hands. No numbness or weakness noted.   Bowel/Bladder Dysfunction: none  Conservative measures:  Physical therapy: has done 2 visits of PT for her neck, last was on 07/21/23 Multimodal medical therapy including regular antiinflammatories: percocet, motrin Injections:  left C3 and C4 MBB on 12/22/23 by Dr. Retia Passe  Past Surgery: no spinal surgery  Patty Cruz has no symptoms of cervical myelopathy. She has intermittent balance issues. No dexterity issues.   The symptoms are causing a significant impact on the patient's life.   Review of Systems:  A 10 point review of systems is negative, except for the pertinent positives and negatives detailed in the HPI.  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   Chronic fatigue    Chronic tension type headache    Depression    Frontal headache 02/02/2020   decreased since taking Amitriptyline, every 3 or 4 days, not as intense as before   Motion sickness    Vertigo     Past Surgical History: Past Surgical History:  Procedure Laterality Date   AIKEN OSTEOTOMY Left 04/16/2023   Procedure: 28298 PHALANX OSTEOTOMY - AKIN;  Surgeon: Gwyneth Revels, DPM;  Location: [facility];  Service: Podiatry;  Laterality: Left;   BUNIONECTOMY Left 04/16/2023   Procedure: LAPIDUS - TYPE;  Surgeon: Gwyneth Revels, DPM;  Location: [facility];  Service: Podiatry;  Laterality: Left;   CARPAL TUNNEL RELEASE Right    CESAREAN SECTION  1991   COLONOSCOPY WITH PROPOFOL N/A 02/17/2023   Procedure: COLONOSCOPY WITH PROPOFOL;  Surgeon: Wyline Mood, MD;  Location: [facility];  Service: Gastroenterology;  Laterality: N/A;   FOOT SURGERY Right    HAMMER TOE SURGERY Left 04/16/2023   Procedure: HAMMER TOE CORRECTION - SECOND;  Surgeon: Gwyneth Revels, DPM;  Location: [facility];  Service: Podiatry;  Laterality: Left;   TONSILLECTOMY     WEIL OSTEOTOMY Left 04/16/2023   Procedure: 28308 WEIL - SECOND;  Surgeon: Gwyneth Revels, DPM;  Location: [facility];  Service: Podiatry;  Laterality: Left;  WRIST SURGERY Right     Allergies: Allergies as of 01/05/2024 - Review Complete 01/05/2024  Allergen Reaction Noted   Tramadol Nausea Only 09/29/2013   Trazodone and nefazodone Nausea And Vomiting 12/27/2014    Medications: Outpatient Encounter Medications as of 01/05/2024  Medication Sig   Ascorbic Acid (VITAMIN C PO) Take by mouth daily.   buPROPion (WELLBUTRIN XL) 300 MG 24 hr tablet Take 1 tablet (300 mg  total) by mouth daily.   Cyanocobalamin (VITAMIN B 12 PO) Take by mouth daily.   Ferrous Sulfate (IRON PO) Take by mouth daily.   Multiple Vitamins-Minerals (TGT MULTIVITAMIN/MULTIMINERAL) TABS    sodium chloride 1 g tablet Take 1 g by mouth daily.   venlafaxine (EFFEXOR) 50 MG tablet Take 1 tablet (50 mg total) by mouth daily.   VITAMIN D PO Take by mouth daily.   [DISCONTINUED] amitriptyline (ELAVIL) 10 MG tablet Take 1 tablet (10 mg total) by mouth at bedtime.   [DISCONTINUED] ondansetron (ZOFRAN) 4 MG tablet Take 1 tablet (4 mg total) by mouth every 8 (eight) hours as needed for nausea or vomiting.   [DISCONTINUED] venlafaxine (EFFEXOR) 25 MG tablet TAKE 2 TABLETS BY MOUTH ONCE DAILY   No facility-administered encounter medications on file as of 01/05/2024.    Social History: Social History   Tobacco Use   Smoking status: Never   Smokeless tobacco: Never  Vaping Use   Vaping status: Never Used  Substance Use Topics   Alcohol use: Not Currently   Drug use: No    Family Medical History: Family History  Problem Relation Age of Onset   Cancer Mother        lung, pancrease, and stomach   Cancer Maternal Grandmother        breast   Breast cancer Maternal Grandmother    Cancer Paternal Grandmother        liver   Alcohol abuse Brother     Physical Examination: Vitals:   01/05/24 1129  BP: 132/76      Awake, alert, oriented to person, place, and time.  Speech is clear and fluent. Fund of knowledge is appropriate.   Cranial Nerves: Pupils equal round and reactive to light.  Facial tone is symmetric.    No abnormal lesions on exposed skin.   Strength: Side Biceps Triceps Deltoid Interossei Grip Wrist Ext. Wrist Flex.  R 5 5 5 5 5 5 5   L 5 5 5 5 5 5 5    Side Iliopsoas Quads Hamstring PF DF EHL  R 5 5 5 5 5 5   L 5 5 5 5 5 5    Reflexes are 3+ and symmetric at the biceps, brachioradialis, patella and achilles.   Hoffman's is positive bilaterally.  Clonus is not  present.   Bilateral upper and lower extremity sensation is intact to light touch.     Gait is normal.     Medical Decision Making  Imaging: None  Assessment and Plan: Ms. Marsee is a pleasant 68 y.o. female who continues with intermittent left sided neck pain with some radiation to her shoulder. Her worst pain is when she has to look to left when driving, otherwise her pain is mostly tolerable. No arm pain.   She had 100% relief of her pain after cervical MBB for hours. By the next day, her pain was back to her baseline.    She has known slip at C3-C4 significant facet hypertrophy and posterior synovial cyst on left along with left foraminal stenosis. She  has cervical spndylosis/DDD C4-C7 with bilateral foraminal stenosis. Slip at C7-T1 as well. Known old fracture posterior ring C1 and odontoid as well. No cervical stenosis.    Left sided neck pain likely from C3-C4 and underlying spondylosis/DDD is likely contributing as well.   Treatment options discussed with patient and following plan made:   - Will discuss with Dr. Retia Passe regarding possible repeat MBB and/or RFA.  - Will review with Dr. Katrinka Blazing to see if she would be a candidate for surgery. Would likely need to revisit PT for this (did not help- had 2 visits last fall).  - Will message her back to regroup and discuss further options.   ADDENDUM 01/05/24:  Reviewed with Dr. Katrinka Blazing. She may be a candidate for ACDF C3-C4. She would need to revisit PT prior to this and would get updated cervical flex/ext xrays prior to seeing him. Will discuss with her after I hear back from Dr. Retia Passe.   ADDENDUM 01/06/24:  I discussed this patient with Dr. Retia Passe as well. He recommends repeating her MBB and if she does well with this then doing RFA.   As above, she is a possible surgery candidate as well, but she would need to do PT. I will message her with options and see how she wants to proceed.   I spent a total of 15 minutes in face-to-face and  non-face-to-face activities related to this patient's care today including review of outside records, review of imaging, review of symptoms, physical exam, discussion of differential diagnosis, discussion of treatment options, and documentation.   Drake Leach PA-C Dept. of Neurosurgery

## 2024-01-05 ENCOUNTER — Ambulatory Visit: Payer: Medicare Other | Admitting: Orthopedic Surgery

## 2024-01-05 ENCOUNTER — Encounter: Payer: Self-pay | Admitting: Orthopedic Surgery

## 2024-01-05 VITALS — BP 132/76 | Ht 65.0 in | Wt 114.0 lb

## 2024-01-05 DIAGNOSIS — M47812 Spondylosis without myelopathy or radiculopathy, cervical region: Secondary | ICD-10-CM

## 2024-01-05 DIAGNOSIS — M4312 Spondylolisthesis, cervical region: Secondary | ICD-10-CM

## 2024-01-05 DIAGNOSIS — M4802 Spinal stenosis, cervical region: Secondary | ICD-10-CM

## 2024-01-05 DIAGNOSIS — M5030 Other cervical disc degeneration, unspecified cervical region: Secondary | ICD-10-CM

## 2024-03-11 ENCOUNTER — Other Ambulatory Visit: Payer: Self-pay | Admitting: Nurse Practitioner

## 2024-03-15 NOTE — Telephone Encounter (Signed)
 D/C  06/09/23. Requested Prescriptions  Signed Prescriptions Disp Refills   venlafaxine  (EFFEXOR ) 50 MG tablet 90 tablet 1    Sig: TAKE 1 TABLET BY MOUTH DAILY     There is no refill protocol information for this order    Refused Prescriptions Disp Refills   buPROPion  (WELLBUTRIN  XL) 150 MG 24 hr tablet [Pharmacy Med Name: BUPROPION  HCL ER (XL) 150 MG TAB] 30 tablet 12    Sig: TAKE 1 TABLET BY MOUTH DAILY     There is no refill protocol information for this order

## 2024-03-15 NOTE — Telephone Encounter (Signed)
 Requested Prescriptions  Pending Prescriptions Disp Refills   buPROPion  (WELLBUTRIN  XL) 150 MG 24 hr tablet [Pharmacy Med Name: BUPROPION  HCL ER (XL) 150 MG TAB] 30 tablet 12    Sig: TAKE 1 TABLET BY MOUTH DAILY     There is no refill protocol information for this order     venlafaxine  (EFFEXOR ) 50 MG tablet [Pharmacy Med Name: VENLAFAXINE  HCL 50 MG TAB] 90 tablet 4    Sig: TAKE 1 TABLET BY MOUTH DAILY     There is no refill protocol information for this order

## 2024-03-17 DIAGNOSIS — M47812 Spondylosis without myelopathy or radiculopathy, cervical region: Secondary | ICD-10-CM | POA: Insufficient documentation

## 2024-03-17 DIAGNOSIS — M5412 Radiculopathy, cervical region: Secondary | ICD-10-CM | POA: Insufficient documentation

## 2024-04-26 ENCOUNTER — Other Ambulatory Visit: Payer: Self-pay | Admitting: Physician Assistant

## 2024-04-26 DIAGNOSIS — H93A3 Pulsatile tinnitus, bilateral: Secondary | ICD-10-CM

## 2024-04-26 DIAGNOSIS — R519 Headache, unspecified: Secondary | ICD-10-CM

## 2024-04-27 ENCOUNTER — Encounter: Payer: Self-pay | Admitting: Physician Assistant

## 2024-05-03 ENCOUNTER — Ambulatory Visit
Admission: RE | Admit: 2024-05-03 | Discharge: 2024-05-03 | Disposition: A | Discharge status: Home / Self Care | Source: Ambulatory Visit | Attending: Physician Assistant | Admitting: Physician Assistant

## 2024-05-03 DIAGNOSIS — R519 Headache, unspecified: Secondary | ICD-10-CM

## 2024-05-03 DIAGNOSIS — H93A3 Pulsatile tinnitus, bilateral: Secondary | ICD-10-CM

## 2024-05-28 ENCOUNTER — Encounter: Payer: Self-pay | Admitting: Advanced Practice Midwife

## 2024-09-20 ENCOUNTER — Encounter: Payer: Self-pay | Admitting: Nurse Practitioner

## 2024-09-21 ENCOUNTER — Other Ambulatory Visit: Payer: Self-pay | Admitting: Nurse Practitioner

## 2024-09-21 ENCOUNTER — Telehealth: Payer: Self-pay | Admitting: Nurse Practitioner

## 2024-09-21 NOTE — Telephone Encounter (Unsigned)
 Copied from CRM [id number]. Topic: Clinical - Medication Question >> Sep 21, 2024  3:04 PM Delon DASEN wrote: Reason for CRM: venlafaxine  (EFFEXOR ) 50 MG tablet - checking status of refill- patient is completely out

## 2024-09-22 NOTE — Telephone Encounter (Signed)
 Patient is overdue for an appointment please reach out to schedule and then route to provider for possible courtesy refill. Last visit was in July 2024.

## 2024-09-23 MED ORDER — VENLAFAXINE HCL 50 MG PO TABS: 50.0000 mg | ORAL_TABLET | Freq: Every day | ORAL | 0 refills | Status: DC

## 2024-09-23 NOTE — Telephone Encounter (Signed)
 Sent in enough supply to get her to visit.

## 2024-09-23 NOTE — Telephone Encounter (Signed)
 Duplicate request, filled 09/23/24 Requested Prescriptions  Pending Prescriptions Disp Refills   venlafaxine  (EFFEXOR ) 50 MG tablet [Pharmacy Med Name: VENLAFAXINE  HCL 50 MG TAB] 90 tablet 1    Sig: TAKE 1 TABLET BY MOUTH ONCE DAILY     Psychiatry: Antidepressants - SNRI - desvenlafaxine & venlafaxine  Failed - 09/23/2024  2:49 PM      Failed - Cr in normal range and within 360 days    Creatinine  Date Value Ref Range Status  11/22/2014 0.54 (L) 0.60 - 1.30 mg/dL Final   Creatinine, Ser  Date Value Ref Range Status  01/23/2023 0.63 0.57 - 1.00 mg/dL Final         Failed - Completed PHQ-2 or PHQ-9 in the last 360 days      Failed - Valid encounter within last 6 months    Recent Outpatient Visits   None            Failed - Lipid Panel in normal range within the last 12 months    Cholesterol, Total  Date Value Ref Range Status  01/23/2023 233 (H) 100 - 199 mg/dL Final   LDL Chol Calc (NIH)  Date Value Ref Range Status  01/23/2023 127 (H) 0 - 99 mg/dL Final   HDL  Date Value Ref Range Status  01/23/2023 94 >39 mg/dL Final   Triglycerides  Date Value Ref Range Status  01/23/2023 68 0 - 149 mg/dL Final         Passed - Last BP in normal range    BP Readings from Last 1 Encounters:  01/05/24 132/76

## 2024-09-30 ENCOUNTER — Ambulatory Visit: Admitting: Emergency Medicine

## 2024-09-30 VITALS — Ht 65.0 in | Wt 115.0 lb

## 2024-09-30 DIAGNOSIS — Z780 Asymptomatic menopausal state: Secondary | ICD-10-CM | POA: Diagnosis not present

## 2024-09-30 DIAGNOSIS — Z0000 Encounter for general adult medical examination without abnormal findings: Secondary | ICD-10-CM | POA: Diagnosis not present

## 2024-09-30 DIAGNOSIS — Z1231 Encounter for screening mammogram for malignant neoplasm of breast: Secondary | ICD-10-CM

## 2024-09-30 NOTE — Progress Notes (Signed)
 Chief Complaint  Patient presents with   Medicare Wellness     Subjective:   Patty Cruz is a 68 y.o. female who presents for a Medicare Annual Wellness Visit.  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   Chronic fatigue    Chronic tension type headache    Depression    Frontal headache 02/02/2020   decreased since taking Amitriptyline , every 3 or 4 days, not as intense as before   Motion sickness    Substance abuse (HCC)    Vertigo    Past Surgical History:  Procedure Laterality Date   AIKEN OSTEOTOMY Left 04/16/2023   Procedure: 28298 PHALANX OSTEOTOMY - AKIN;  Surgeon: Ashley Soulier, DPM;  Location: [facility];  Service: Podiatry;  Laterality: Left;   BUNIONECTOMY Left 04/16/2023   Procedure: LAPIDUS - TYPE;  Surgeon: Ashley Soulier, DPM;  Location: [facility];  Service: Podiatry;  Laterality: Left;   CARPAL TUNNEL RELEASE Right    CESAREAN SECTION  1991   COLONOSCOPY WITH PROPOFOL  N/A 02/17/2023   Procedure: COLONOSCOPY WITH PROPOFOL ;  Surgeon: Therisa Bi, MD;  Location: [facility];  Service: Gastroenterology;  Laterality: N/A;   FOOT SURGERY Right    HAMMER TOE SURGERY Left 04/16/2023   Procedure: HAMMER TOE CORRECTION - SECOND;  Surgeon: Ashley Soulier, DPM;  Location: [facility];  Service: Podiatry;  Laterality: Left;   TONSILLECTOMY     WEIL OSTEOTOMY Left 04/16/2023   Procedure: 28308 WEIL - SECOND;  Surgeon: Ashley Soulier, DPM;  Location: [facility];  Service: Podiatry;  Laterality: Left;   WRIST SURGERY Right    Family History  Problem Relation  Age of Onset   Cancer Mother        lung, pancrease, and stomach   Cancer Maternal Grandmother        breast   Breast cancer Maternal Grandmother    Cancer Paternal Grandmother        liver   Alcohol abuse Brother    Social History   Occupational History   Not on file  Tobacco Use   Smoking status: Never   Smokeless tobacco: Never  Vaping Use   Vaping status: Never Used  Substance and Sexual Activity   Alcohol use: Not Currently   Drug use: Never   Sexual activity: Not Currently   Tobacco Counseling Counseling given: Not Answered  SDOH Screenings   Food Insecurity: No Food Insecurity (09/30/2024)  Housing: Low Risk  (09/30/2024)  Transportation Needs: No Transportation Needs (09/30/2024)  Utilities: Not At Risk (09/30/2024)  Alcohol Screen: Low Risk  (01/23/2023)  Depression (PHQ2-9): Medium Risk (09/30/2024)  Financial Resource Strain: Low Risk  (09/29/2024)  Physical Activity: Sufficiently Active (09/30/2024)  Social Connections: Moderately Isolated (09/30/2024)  Stress: No Stress Concern Present (09/30/2024)  Tobacco Use: Low Risk  (09/30/2024)  Health Literacy: Adequate Health Literacy (09/30/2024)   See flowsheets for full screening details  Depression Screen PHQ 2 & 9 Depression Scale- Over the past 2 weeks, how often have you been bothered by any of the following problems? Little interest or pleasure in doing things: 1 Feeling down, depressed, or  hopeless (PHQ Adolescent also includes...irritable): 1 PHQ-2 Total Score: 2 Trouble falling or staying asleep, or sleeping too much: 0 Feeling tired or having little energy: 3 Poor appetite or overeating (PHQ Adolescent also includes...weight loss): 0 Feeling bad about yourself - or that you are a failure or have let yourself or your family down: 0 Trouble concentrating on things, such as reading the newspaper or watching television (PHQ Adolescent also includes...like school work): 0 Moving or speaking so slowly  that other people could have noticed. Or the opposite - being so fidgety or restless that you have been moving around a lot more than usual: 0 Thoughts that you would be better off dead, or of hurting yourself in some way: 0 PHQ-9 Total Score: 5 If you checked off any problems, how difficult have these problems made it for you to do your work, take care of things at home, or get along with other people?: Not difficult at all  Depression Treatment Depression Interventions/Treatment : Currently on Treatment     Goals Addressed               This Visit's Progress     Maintain or better current health (pt-stated)         Visit info / Clinical Intake: Medicare Wellness Visit Type:: Subsequent Annual Wellness Visit Persons participating in visit:: patient Medicare Wellness Visit Mode:: Telephone If telephone:: video declined Because this visit was a virtual/telehealth visit:: pt reported vitals If Telephone or Video please confirm:: I connected with the patient using audio enabled telemedicine application and verified that I am speaking with the correct person using two identifiers; I discussed the limitations of evaluation and management by telemedicine; The patient expressed understanding and agreed to proceed Patient Location:: work Provider Location:: home Information given by:: patient Interpreter Needed?: No Pre-visit prep was completed: yes AWV questionnaire completed by patient prior to visit?: yes Date:: 09/29/24 Living arrangements:: (!) lives alone Patient's Overall Health Status Rating: very good Typical amount of pain: (!) a lot (pain varies) Does pain affect daily life?: no Are you currently prescribed opioids?: no  Dietary Habits and Nutritional Risks How many meals a day?: 4 Eats fruit and vegetables daily?: yes Most meals are obtained by: preparing own meals In the last 2 weeks, have you had any of the following?: none Diabetic:: no  Functional  Status Activities of Daily Living (to include ambulation/medication): Independent Ambulation: Independent with device- listed below Home Assistive Devices/Equipment: Eyeglasses Medication Administration: Independent Home Management: Independent Manage your own finances?: yes Primary transportation is: driving Concerns about vision?: no *vision screening is required for WTM* Concerns about hearing?: no  Fall Screening Falls in the past year?: 1 Number of falls in past year: 0 Was there an injury with Fall?: 0 Fall Risk Category Calculator: 1 Patient Fall Risk Level: Low Fall Risk  Fall Risk Patient at Risk for Falls Due to: History of fall(s); Impaired balance/gait Fall risk Follow up: Falls evaluation completed; Education provided; Falls prevention discussed  Home and Transportation Safety: All rugs have non-skid backing?: N/A, no rugs All stairs or steps have railings?: (!) no (4 steps without handrail) Grab bars in the bathtub or shower?: (!) no Have non-skid surface in bathtub or shower?: yes Good home lighting?: yes Regular seat belt use?: yes Hospital stays in the last year:: no  Cognitive Assessment Difficulty concentrating, remembering, or making decisions? : no Will 6CIT or Mini Cog be Completed: yes What year is it?: 0 points What month is  it?: 0 points Give patient an address phrase to remember (5 components): [address] [place] About what time is it?: 0 points Count backwards from 20 to 1: 0 points Say the months of the year in reverse: 0 points Repeat the address phrase from earlier: 0 points 6 CIT Score: 0 points  Advance Directives (For Healthcare) Does Patient Have a Medical Advance Directive?: Yes Does patient want to make changes to medical advance directive?: No - Patient declined Type of Advance Directive: Healthcare Power of [place] Vera; Living will Copy of Healthcare Power of Attorney in Chart?: No - copy requested Copy of Living Will in Chart?:  No - copy requested  Reviewed/Updated  Reviewed/Updated: Reviewed All (Medical, Surgical, Family, Medications, Allergies, Care Teams, Patient Goals)        Objective:    Today's Vitals   09/30/24 1557  Weight: 115 lb (52.2 kg)  Height: 5' 5 (1.651 m)   Body mass index is 19.14 kg/m.  Current Medications (verified) Outpatient Encounter Medications as of 09/30/2024  Medication Sig   Ascorbic Acid (VITAMIN C PO) Take by mouth daily.   aspirin EC 81 MG tablet Take 81 mg by mouth daily.   Cyanocobalamin  (VITAMIN B 12 PO) Take by mouth daily.   diclofenac Sodium (VOLTAREN) 1 % GEL Apply 2 g topically 4 (four) times daily as needed.   Ferrous Sulfate (IRON PO) Take by mouth daily.   Multiple Vitamins-Minerals (TGT MULTIVITAMIN/MULTIMINERAL) TABS    sodium chloride  1 g tablet Take 1 g by mouth daily.   thiamine  (VITAMIN B1) 100 MG tablet Take 100 mg by mouth daily.   venlafaxine  (EFFEXOR ) 50 MG tablet Take 1 tablet (50 mg total) by mouth daily.   VITAMIN D  PO Take by mouth daily.   buPROPion  (WELLBUTRIN  XL) 300 MG 24 hr tablet Take 1 tablet (300 mg total) by mouth daily. (Patient not taking: Reported on 09/30/2024)   eletriptan (RELPAX) 40 MG tablet Take 40 mg by mouth every 2 (two) hours as needed for migraine. (Patient not taking: Reported on 09/30/2024)   Galcanezumab-gnlm 120 MG/ML SOAJ Inject 120 mg into the skin every 30 (thirty) days. (Patient not taking: Reported on 09/30/2024)   No facility-administered encounter medications on file as of 09/30/2024.   Hearing/Vision screen Hearing Screening - Comments:: Denies hearing loss  Vision Screening - Comments:: UTD @ [facility] Immunizations and Health Maintenance Health Maintenance  Topic Date Due   COVID-19 Vaccine (3 - Pfizer risk series) 04/12/2020   Bone Density Scan  Never done   Mammogram  10/17/2021   Influenza Vaccine  02/08/2025 (Originally 06/11/2024)   Medicare Annual Wellness (AWV)  09/30/2025    DTaP/Tdap/Td (2 - Tdap) 01/22/2033   Colonoscopy  02/16/2033   Pneumococcal Vaccine: 50+ Years  Completed   Hepatitis C Screening  Completed   Zoster Vaccines- Shingrix  Completed   Meningococcal B Vaccine  Aged Out        Assessment/Plan:  This is a routine wellness examination for Patty Cruz.  Patient Care Team: Cannady, Jolene T, NP as PCP - General (Nurse Practitioner) Darlean Ned, MD (Rehabilitation) Mason, Sarah E, PA-C (Neurology) Tobie Lady POUR, MD as Consulting Physician (Rheumatology) Hilma Hastings, PA-C as Physician Assistant (Neurosurgery)  I have personally reviewed and noted the following in the patient's chart:   Medical and social history Use of alcohol, tobacco or illicit drugs  Current medications and supplements including opioid prescriptions. Functional ability and status Nutritional status Physical activity Advanced directives  List of other physicians Hospitalizations, surgeries, and ER visits in previous 12 months Vitals Screenings to include cognitive, depression, and falls Referrals and appointments  Orders Placed This Encounter  Procedures   MM 3D SCREENING MAMMOGRAM BILATERAL BREAST    Standing Status:   Future    Expected Date:   10/01/2024    Expiration Date:   09/30/2025    Scheduling Instructions:     Would like to schedule in same visit as DEXA    Reason for Exam (SYMPTOM  OR DIAGNOSIS REQUIRED):   screening for breast cancer    Preferred imaging location?:   [facility]   DG Bone Density    Standing Status:   Future    Expected Date:   10/01/2024    Expiration Date:   09/30/2025    Scheduling Instructions:     Would like in the visit as MMG    Reason for Exam (SYMPTOM  OR DIAGNOSIS REQUIRED):   postmenopausal estrogen deficiency    Preferred imaging location?:   [facility]   In addition, I have reviewed and discussed with patient certain preventive protocols, quality metrics, and best practice recommendations. A written  personalized care plan for preventive services as well as general preventive health recommendations were provided to patient.   Vina Ned, CMA   09/30/2024   Return in 54 weeks (on 10/13/2025).  After Visit Summary: (MyChart) Due to this being a telephonic visit, the after visit summary with patients personalized plan was offered to patient via MyChart   Nurse Notes:  Placed order for MMG and DEXA Flu shot UTD per patient @ [facility] (did not know the date of vaccine) Declined Covid vaccine

## 2024-09-30 NOTE — Patient Instructions (Signed)
 Patty Cruz,  Thank you for taking the time for your Medicare Wellness Visit. I appreciate your continued commitment to your health goals. Please review the care plan we discussed, and feel free to reach out if I can assist you further.  Please note that Annual Wellness Visits do not include a physical exam. Some assessments may be limited, especially if the visit was conducted virtually. If needed, we may recommend an in-person follow-up with your provider.  Ongoing Care Seeing your primary care provider every 3 to 6 months helps us  monitor your health and provide consistent, personalized care.   Referrals If a referral was made during today's visit and you haven't received any updates within two weeks, please contact the referred provider directly to check on the status.  Recommended Screenings:  Please call to schedule your mammogram and bone density scan:  [facility] Address: [address], [place], [place] Phone: [phone]  [facility] [address], [address] [place], [place] [address] Phone: [phone]   Health Maintenance  Topic Date Due   COVID-19 Vaccine (3 - Pfizer risk series) 04/12/2020   Osteoporosis screening with Bone Density Scan  Never done   Breast Cancer Screening  10/17/2021   Medicare Annual Wellness Visit  01/23/2024   Flu Shot  06/11/2024   DTaP/Tdap/Td vaccine (2 - Tdap) 01/22/2033   Colon Cancer Screening  02/16/2033   Pneumococcal Vaccine for age over 51  Completed   Hepatitis C Screening  Completed   Zoster (Shingles) Vaccine  Completed   Meningitis B Vaccine  Aged Out       09/30/2024    4:05 PM  Advanced Directives  Does Patient Have a Medical Advance Directive? Yes  Type of Estate Agent of [place];Living will  Does patient want to make changes to medical advance directive? No - Patient declined  Copy of Healthcare Power of Attorney in Chart?  No - copy requested    Vision: Annual vision screenings are recommended for early detection of glaucoma, cataracts, and diabetic retinopathy. These exams can also reveal signs of chronic conditions such as diabetes and high blood pressure.  Dental: Annual dental screenings help detect early signs of oral cancer, gum disease, and other conditions linked to overall health, including heart disease and diabetes.  Please see the attached documents for additional preventive care recommendations.

## 2024-10-26 ENCOUNTER — Other Ambulatory Visit: Payer: Self-pay | Admitting: Podiatry

## 2024-10-30 NOTE — Patient Instructions (Incomplete)

## 2024-11-01 ENCOUNTER — Encounter: Payer: Self-pay | Admitting: Podiatry

## 2024-11-01 ENCOUNTER — Encounter: Payer: Self-pay | Admitting: Nurse Practitioner

## 2024-11-01 ENCOUNTER — Ambulatory Visit: Admitting: Nurse Practitioner

## 2024-11-01 VITALS — BP 135/80 | HR 66 | Temp 97.4°F | Resp 16 | Ht 62.24 in | Wt 115.0 lb

## 2024-11-01 DIAGNOSIS — R5382 Chronic fatigue, unspecified: Secondary | ICD-10-CM | POA: Diagnosis not present

## 2024-11-01 DIAGNOSIS — E538 Deficiency of other specified B group vitamins: Secondary | ICD-10-CM

## 2024-11-01 DIAGNOSIS — E7800 Pure hypercholesterolemia, unspecified: Secondary | ICD-10-CM | POA: Diagnosis not present

## 2024-11-01 DIAGNOSIS — Z780 Asymptomatic menopausal state: Secondary | ICD-10-CM | POA: Diagnosis not present

## 2024-11-01 DIAGNOSIS — Z0000 Encounter for general adult medical examination without abnormal findings: Secondary | ICD-10-CM | POA: Diagnosis not present

## 2024-11-01 DIAGNOSIS — F1011 Alcohol abuse, in remission: Secondary | ICD-10-CM | POA: Diagnosis not present

## 2024-11-01 DIAGNOSIS — E559 Vitamin D deficiency, unspecified: Secondary | ICD-10-CM

## 2024-11-01 DIAGNOSIS — R7989 Other specified abnormal findings of blood chemistry: Secondary | ICD-10-CM

## 2024-11-01 DIAGNOSIS — F320 Major depressive disorder, single episode, mild: Secondary | ICD-10-CM | POA: Diagnosis not present

## 2024-11-01 DIAGNOSIS — G44229 Chronic tension-type headache, not intractable: Secondary | ICD-10-CM

## 2024-11-01 DIAGNOSIS — R7689 Other specified abnormal immunological findings in serum: Secondary | ICD-10-CM

## 2024-11-01 DIAGNOSIS — M79672 Pain in left foot: Secondary | ICD-10-CM

## 2024-11-01 MED ORDER — BUPROPION HCL ER (XL) 300 MG PO TB24: 300.0000 mg | ORAL_TABLET | Freq: Every day | ORAL | 4 refills | Status: AC

## 2024-11-01 MED ORDER — VENLAFAXINE HCL 50 MG PO TABS: 50.0000 mg | ORAL_TABLET | Freq: Every day | ORAL | 3 refills | Status: AC

## 2024-11-01 NOTE — Assessment & Plan Note (Signed)
Chronic, ongoing. Taking supplement at home.  Check level today and adjust as needed.

## 2024-11-01 NOTE — Assessment & Plan Note (Signed)
 Noted on past labs, focusing on diet at home.  Recheck levels today. Would benefit statin therapy, check labs today. The 10-year ASCVD risk score (Arnett DK, et al., 2019) is: 8.8%   Values used to calculate the score:     Age: 68 years     Clinically relevant sex: Female     Is Non-Hispanic African American: No     Diabetic: No     Tobacco smoker: No     Systolic Blood Pressure: 135 mmHg     Is BP treated: No     HDL Cholesterol: 94 mg/dL     Total Cholesterol: 233 mg/dL

## 2024-11-01 NOTE — Assessment & Plan Note (Signed)
 Chronic, stable.  Has been in remission for >8 years.  Continue to monitor closely for relapse and encourage continued cessation.  H/O heavy alcohol use with suicidal ideation.

## 2024-11-01 NOTE — Anesthesia Preprocedure Evaluation (Addendum)
 "                                  Anesthesia Evaluation    Airway        Dental   Pulmonary           Cardiovascular   07-23-23 echo Indications:     SBE I33.9                   Shortness of breath on exertion R06.02    History:         Patient has no prior history of Echocardiogram  examinations.                  Alcohol abuse                   anxiety.    Sonographer:     Christopher Furnace  Referring Phys:  [id number] JOLENE T CANNADY  Diagnosing Phys: Evalene Lunger MD   IMPRESSIONS     1. Left ventricular ejection fraction, by estimation, is 60 to 65%. The  left ventricle has normal function. The left ventricle has no regional  wall motion abnormalities. Left ventricular diastolic parameters are  consistent with Grade I diastolic  dysfunction (impaired relaxation).   2. Right ventricular systolic function is normal. The right ventricular  size is normal. There is normal pulmonary artery systolic pressure. The  estimated right ventricular systolic pressure is 25.4 mmHg.   3. The mitral valve is normal in structure. No evidence of mitral valve  regurgitation. No evidence of mitral stenosis.   4. The aortic valve has an indeterminant number of cusps. Aortic valve  regurgitation is not visualized. No aortic stenosis is present.   5. The inferior vena cava is normal in size with greater than 50%  respiratory variability, suggesting right atrial pressure of 3 mmHg.     Neuro/Psych 04-23-09   04/23/2009 CT CERVICAL SPINE W IMPRESSION:  1. The patient has sustained fractures of the bony ring of C1 and through  the lateral masses bilaterally of C2 with a fracture line extending  obliquely along the base of the odontoid. A fracture line extends into the  left vertebral foramen at C2. A fracture line may extend into the right  vertebral foramen at this level as well.  2. There is a fracture through the spinous process of C7.  3. There are fractures of the left first  rib and of the second and third  ribs bilaterally. No apical pneumothorax is identified.    04/23/2009 CT HEAD LIMITED WO IMPRESSION:       I see no evidence of acute intracranial hemorrhage nor  other acute intracranial abnormality.    04/23/2009 CT SPINE CERVICAL SCREENING IMPRESSION:  1. Multiple fractures of the cerv~ical spine as described above  including a comminuted type III dens fracture, bilateral C1  posterior arch fractures, and C7 spinous process fractures. A  comminuted fracture of the left C2 from transversarium is also  identified with concern for vertebral artery injury.  CTA or MRA  of the neck may be helpful for further evaluation as clinically  indicated.  2. Bilateral rib fractures as described above.    04/23/2009 CT SPINE LUMBAR WO IMPRESSION: No evidence of an acute fracture or listhesis in the  lumbar spine.    04/23/2009 CT SPINE THORACIC WO IMPRESSION:  1. Minimally displaced C7 spinous process  fracture.  2. Bilateral rib fractures as described above.         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

## 2024-11-01 NOTE — Assessment & Plan Note (Signed)
 Noted on labs in past, but recent stable. Check TSH and Free T4 today.

## 2024-11-01 NOTE — Progress Notes (Signed)
 "  BP 135/80 (BP Location: Left Arm, Patient Position: Sitting, Cuff Size: Normal)   Pulse 66   Temp (!) 97.4 F (36.3 C) (Oral)   Resp 16   Ht 5' 2.24 (1.581 m)   Wt 115 lb (52.2 kg)   SpO2 99%   BMI 20.87 kg/m    Subjective:    Patient ID: Patty Cruz, female    DOB: 1956/03/11, 68 y.o.   MRN: [id number]  HPI: Patty Cruz is a 68 y.o. female presenting on 11/01/2024 for comprehensive medical examination. Current medical complaints include:none  She currently lives with: self Menopausal Symptoms: no  FOOT PAIN Scheduled for left great toe fusion on 11/10/24, presents for pre op assessment. Duration: months Involved foot: left Mechanism of injury: unknown Location: left foot Onset: gradual  Severity: 8/10  Quality:  sharp, aching, and throbbing Frequency: intermittent Radiation: no Aggravating factors: weight bearing and walking  Alleviating factors: different pads on feet and changing shoes  Status: fluctuating Treatments attempted: as above  Relief with NSAIDs?:  no Weakness with weight bearing or walking: no Morning stiffness: no Swelling: no Redness: no Bruising: no Paresthesias / decreased sensation: no  Fevers:no   MIGRAINES Following with neurology, recent visit they ordered Emgality and Relpax - she has not obtained as of yet. Saw rheumatology for positive ANA last on 02/26/24, had trial colchicine but this caused diarrhea. Has had injections in the neck for her chronic pain with ortho, last 09/15/24. Continues iron, Vitamin D , and B12 for past low levels. Has chronically low sodium for which she takes supplement. Duration: chronic Onset: gradual Severity: 7/10 Quality: sharp, dull, aching, and throbbing Frequency: intermittent Location: frontal Headache duration: several hours Radiation: no Time of day headache occurs: varies Alleviating factors: sleep, quiet Aggravating factors: unknown Headache status at time of visit: asymptomatic Treatments  attempted: multiple treatments   Aura: no Nausea:  yes Vomiting: no Photophobia:  yes and dizziness Phonophobia:  yes Effect on social functioning:  yes Numbers of missed days of school/work each month: none Confusion:  no Gait disturbance/ataxia:  no Behavioral changes:  no Fevers:  no   DEPRESSION Has been taking Effexor  daily, has been out of Wellbutrin  since July. Mood status: stable Satisfied with current treatment?: yes Symptom severity: moderate  Duration of current treatment : chronic Side effects: no Medication compliance: good compliance Psychotherapy/counseling: in the past Depressed mood: occasionally Anxious mood: yes Anhedonia: occasionally Significant weight loss or gain: no Insomnia: none Fatigue: yes Feelings of worthlessness or guilt: no Impaired concentration/indecisiveness: no Suicidal ideations: no Hopelessness: no Crying spells: no    11/01/2024   10:30 AM 09/30/2024    4:11 PM 06/09/2023   11:23 AM 01/23/2023    9:26 AM 04/23/2021    1:58 PM  Depression screen PHQ 2/9  Decreased Interest 1 1 1 1  0  Down, Depressed, Hopeless 1 1 0 1 0  PHQ - 2 Score 2 2 1 2  0  Altered sleeping 2 0 0 1 3  Tired, decreased energy 2 3 3 3 3   Change in appetite 0 0 0 1 1  Feeling bad or failure about yourself  0 0 0 0 0  Trouble concentrating 0 0 1 0 0  Moving slowly or fidgety/restless 0 0 0 0 0  Suicidal thoughts 0 0 0 0 0  PHQ-9 Score 6 5 5  7  7    Difficult doing work/chores  Not difficult at all Somewhat difficult Somewhat difficult  Data saved with a previous flowsheet row definition      11/01/2024   10:31 AM 06/09/2023   11:23 AM 01/23/2023    9:26 AM 08/16/2020    4:06 PM  GAD 7 : Generalized Anxiety Score  Nervous, Anxious, on Edge 0 0 0 0  Control/stop worrying 0 0 0 0  Worry too much - different things 0 0 0 0  Trouble relaxing 0 0 0 0  Restless 0 0 0 0  Easily annoyed or irritable 0 0 0 0  Afraid - awful might happen 0 0 0 0  Total  GAD 7 Score 0 0 0 0  Anxiety Difficulty  Not difficult at all Not difficult at all Not difficult at all        01/23/2023    9:25 AM 06/09/2023   11:23 AM 09/29/2024   10:46 PM 09/30/2024    4:05 PM 11/01/2024   10:26 AM  Fall Risk  Falls in the past year? 0 0 1  1 1   Was there an injury with Fall? 0  0  0   0  1  Fall Risk Category Calculator 0 0 1  1 2   Patient at Risk for Falls Due to No Fall Risks No Fall Risks  History of fall(s);Impaired balance/gait History of fall(s)  Fall risk Follow up Falls evaluation completed Falls evaluation completed  Falls evaluation completed;Education provided;Falls prevention discussed Falls evaluation completed     Manually entered by patient   Data saved with a previous flowsheet row definition    Functional Status Survey: Is the patient deaf or have difficulty hearing?: No Does the patient have difficulty seeing, even when wearing glasses/contacts?: No Does the patient have difficulty concentrating, remembering, or making decisions?: No Does the patient have difficulty walking or climbing stairs?: No Does the patient have difficulty dressing or bathing?: No Does the patient have difficulty doing errands alone such as visiting a doctor's office or shopping?: No   Past Medical History:  Past Medical History:  Diagnosis Date   Alcohol abuse    Alcohol use disorder, severe, dependence (HCC) 04/02/2016   Alcohol withdrawal (HCC) 04/02/2016   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   Chronic fatigue    Chronic tension type headache    Depression    Frontal headache 02/02/2020   decreased since taking Amitriptyline , every 3 or 4 days, not as intense as before   Motion sickness    Substance abuse [facility])    Vertigo     Surgical History:  Past Surgical History:  Procedure Laterality Date   AIKEN  OSTEOTOMY Left 04/16/2023   Procedure: 28298 PHALANX OSTEOTOMY - AKIN;  Surgeon: Ashley Soulier, DPM;  Location: [facility];  Service: Podiatry;  Laterality: Left;   BUNIONECTOMY Left 04/16/2023   Procedure: LAPIDUS - TYPE;  Surgeon: Ashley Soulier, DPM;  Location: [facility];  Service: Podiatry;  Laterality: Left;   CARPAL TUNNEL RELEASE Right    CESAREAN SECTION  1991   COLONOSCOPY WITH PROPOFOL  N/A 02/17/2023   Procedure: COLONOSCOPY WITH PROPOFOL ;  Surgeon: Therisa Bi, MD;  Location: [facility];  Service: Gastroenterology;  Laterality: N/A;   FOOT SURGERY Right    HAMMER TOE SURGERY Left 04/16/2023   Procedure: HAMMER TOE CORRECTION - SECOND;  Surgeon: Ashley Soulier, DPM;  Location: [facility];  Service: Podiatry;  Laterality: Left;   TONSILLECTOMY     WEIL OSTEOTOMY Left 04/16/2023   Procedure: 28308 WEIL - SECOND;  Surgeon: Ashley Soulier, DPM;  Location: [facility];  Service: Podiatry;  Laterality: Left;   WRIST SURGERY Right     Medications:  Current Outpatient Medications on File Prior to Visit  Medication Sig   Ascorbic Acid (VITAMIN C PO) Take by mouth daily.   aspirin EC 81 MG tablet Take 81 mg by mouth daily.   Cyanocobalamin  (VITAMIN B 12 PO) Take by mouth daily.   diclofenac Sodium (VOLTAREN) 1 % GEL Apply 2 g topically 4 (four) times daily as needed.   eletriptan (RELPAX) 40 MG tablet Take 40 mg by mouth every 2 (two) hours as needed for migraine.   Ferrous Sulfate (IRON PO) Take by mouth daily.   Multiple Vitamins-Minerals (TGT MULTIVITAMIN/MULTIMINERAL) TABS    sodium chloride  1 g tablet Take 1 g by mouth daily.   thiamine  (VITAMIN B1) 100 MG tablet Take 100 mg by mouth daily.   VITAMIN D  PO Take by mouth daily.   Galcanezumab-gnlm 120 MG/ML SOAJ Inject 120 mg into the skin every 30 (thirty) days. (Patient not taking: Reported on 11/01/2024)   No current facility-administered medications on file prior to visit.    Allergies:   Allergies[1]  Social History:  Social History   Socioeconomic History   Marital status: Divorced    Spouse name: Not on file   Number of children: 3   Years of education: Not on file   Highest education level: Bachelor's degree (e.g., BA, AB, BS)  Occupational History   Not on file  Tobacco Use   Smoking status: Never   Smokeless tobacco: Never  Vaping Use   Vaping status: Never Used  Substance and Sexual Activity   Alcohol use: Not Currently   Drug use: Never   Sexual activity: Not Currently  Other Topics Concern   Not on file  Social History Narrative   Not on file   Social Drivers of Health   Tobacco Use: Low Risk (11/01/2024)   Patient History    Smoking Tobacco Use: Never    Smokeless Tobacco Use: Never    Passive Exposure: Not on file  Financial Resource Strain: Low Risk (09/29/2024)   Overall Financial Resource Strain (CARDIA)    Difficulty of Paying Living Expenses: Not hard at all  Food Insecurity: No Food Insecurity (09/30/2024)   Epic    Worried About Programme Researcher, Broadcasting/film/video in the Last Year: Never true    Ran Out of Food in the Last Year: Never true  Transportation Needs: No Transportation Needs (09/30/2024)   Epic    Lack of Transportation (Medical): No    Lack of Transportation (Non-Medical): No  Physical Activity: Sufficiently Active (09/30/2024)   Exercise Vital Sign    Days of Exercise per Week: 7 days    Minutes of Exercise per Session: 40 min  Stress: No Stress Concern Present (09/30/2024)   [employer] of Occupational Health - Occupational Stress Questionnaire    Feeling of Stress: Only a little  Social Connections: Moderately Isolated (09/30/2024)   Social Connection and Isolation Panel    Frequency of Communication with Friends and Family: Three times a week    Frequency of Social Gatherings with Friends and Family: Three times a week    Attends Religious Services: More than 4 times per year    Active Member of Clubs or Organizations:  No    Attends Club  or Organization Meetings: Never    Marital Status: Divorced  Catering Manager Violence: Not At Risk (09/30/2024)   Epic    Fear of Current or Ex-Partner: No    Emotionally Abused: No    Physically Abused: No    Sexually Abused: No  Depression (PHQ2-9): Medium Risk (11/01/2024)   Depression (PHQ2-9)    PHQ-2 Score: 6  Alcohol Screen: Low Risk (01/23/2023)   Alcohol Screen    Last Alcohol Screening Score (AUDIT): 0  Housing: Low Risk (09/30/2024)   Epic    Unable to Pay for Housing in the Last Year: No    Number of Times Moved in the Last Year: 0    Homeless in the Last Year: No  Utilities: Not At Risk (09/30/2024)   Epic    Threatened with loss of utilities: No  Health Literacy: Adequate Health Literacy (09/30/2024)   B1300 Health Literacy    Frequency of need for help with medical instructions: Never   Tobacco Use History[2] Social History   Substance and Sexual Activity  Alcohol Use Not Currently    Family History:  Family History  Problem Relation Age of Onset   Cancer Mother        lung, pancrease, and stomach   Cancer Maternal Grandmother        breast   Breast cancer Maternal Grandmother    Cancer Paternal Grandmother        liver   Alcohol abuse Brother     Past medical history, surgical history, medications, allergies, family history and social history reviewed with patient today and changes made to appropriate areas of the chart.   ROS All other ROS negative except what is listed above and in the HPI.      Objective:    BP 135/80 (BP Location: Left Arm, Patient Position: Sitting, Cuff Size: Normal)   Pulse 66   Temp (!) 97.4 F (36.3 C) (Oral)   Resp 16   Ht 5' 2.24 (1.581 m)   Wt 115 lb (52.2 kg)   SpO2 99%   BMI 20.87 kg/m   Wt Readings from Last 3 Encounters:  11/01/24 115 lb (52.2 kg)  09/30/24 115 lb (52.2 kg)  01/05/24 114 lb (51.7 kg)    Physical Exam Vitals and nursing note reviewed. Exam conducted with a  chaperone present.  Constitutional:      General: She is awake. She is not in acute distress.    Appearance: She is well-developed and well-groomed. She is not ill-appearing or toxic-appearing.  HENT:     Head: Normocephalic and atraumatic.     Right Ear: Hearing, tympanic membrane, ear canal and external ear normal. No drainage.     Left Ear: Hearing, tympanic membrane, ear canal and external ear normal. No drainage.     Nose: Nose normal.     Right Sinus: No maxillary sinus tenderness or frontal sinus tenderness.     Left Sinus: No maxillary sinus tenderness or frontal sinus tenderness.     Mouth/Throat:     Mouth: Mucous membranes are moist.     Pharynx: Oropharynx is clear. Uvula midline. No pharyngeal swelling, oropharyngeal exudate or posterior oropharyngeal erythema.  Eyes:     General: Lids are normal.        Right eye: No discharge.        Left eye: No discharge.     Extraocular Movements: Extraocular movements intact.     Conjunctiva/sclera: Conjunctivae normal.     Pupils: Pupils are  equal, round, and reactive to light.     Visual Fields: Right eye visual fields normal and left eye visual fields normal.  Neck:     Thyroid : No thyromegaly.     Vascular: No carotid bruit.     Trachea: Trachea normal.  Cardiovascular:     Rate and Rhythm: Normal rate and regular rhythm.     Heart sounds: Normal heart sounds. No murmur heard.    No gallop.  Pulmonary:     Effort: Pulmonary effort is normal. No accessory muscle usage or respiratory distress.     Breath sounds: Normal breath sounds.  Chest:  Breasts:    Right: Normal.     Left: Normal.  Abdominal:     General: Bowel sounds are normal.     Palpations: Abdomen is soft. There is no hepatomegaly or splenomegaly.     Tenderness: There is no abdominal tenderness.  Musculoskeletal:        General: Normal range of motion.     Cervical back: Normal range of motion and neck supple.     Right lower leg: No edema.     Left lower  leg: No edema.  Lymphadenopathy:     Head:     Right side of head: No submental, submandibular, tonsillar, preauricular or posterior auricular adenopathy.     Left side of head: No submental, submandibular, tonsillar, preauricular or posterior auricular adenopathy.     Cervical: No cervical adenopathy.     Upper Body:     Right upper body: No supraclavicular, axillary or pectoral adenopathy.     Left upper body: No supraclavicular, axillary or pectoral adenopathy.  Skin:    General: Skin is warm and dry.     Capillary Refill: Capillary refill takes less than 2 seconds.     Findings: No rash.  Neurological:     Mental Status: She is alert and oriented to person, place, and time.     Gait: Gait is intact.     Deep Tendon Reflexes: Reflexes are normal and symmetric.     Reflex Scores:      Brachioradialis reflexes are 2+ on the right side and 2+ on the left side.      Patellar reflexes are 2+ on the right side and 2+ on the left side. Psychiatric:        Attention and Perception: Attention normal.        Mood and Affect: Mood normal.        Speech: Speech normal.        Behavior: Behavior normal. Behavior is cooperative.        Thought Content: Thought content normal.        Judgment: Judgment normal.     Results for orders placed or performed during the hospital encounter of 07/23/23  ECHOCARDIOGRAM COMPLETE   Collection Time: 07/23/23 11:39 AM  Result Value Ref Range   Ao pk vel 1.12 m/s   AV Area VTI 3.08 cm2   AR max vel 2.51 cm2   AV Mean grad 3.0 mmHg   AV Peak grad 5.0 mmHg   S' Lateral 2.30 cm   AV Area mean vel 2.53 cm2   Area-P 1/2 3.93 cm2   MV VTI 2.55 cm2   Est EF 60 - 65%       Assessment & Plan:   Problem List Items Addressed This Visit       Other   Vitamin D  deficiency   Chronic, ongoing. Taking supplement at  home.  Check level today and adjust as needed.      Relevant Orders   VITAMIN D  25 Hydroxy (Vit-D Deficiency, Fractures)   Vitamin B12  deficiency   Chronic, ongoing. Taking supplement at home.  Check level today and adjust as needed.      Relevant Orders   Vitamin B12   Elevated TSH   Noted on labs in past, but recent stable. Check TSH and Free T4 today.      Relevant Orders   T4, free   TSH   Elevated LDL cholesterol level   Noted on past labs, focusing on diet at home.  Recheck levels today. Would benefit statin therapy, check labs today. The 10-year ASCVD risk score (Arnett DK, et al., 2019) is: 8.8%   Values used to calculate the score:     Age: 12 years     Clinically relevant sex: Female     Is Non-Hispanic African American: No     Diabetic: No     Tobacco smoker: No     Systolic Blood Pressure: 135 mmHg     Is BP treated: No     HDL Cholesterol: 94 mg/dL     Total Cholesterol: 233 mg/dL       Relevant Orders   Comprehensive metabolic panel with GFR   Lipid Panel w/o Chol/HDL Ratio   Depression, major, single episode, mild - Primary   Chronic, stable with current Effexor  dose and Wellbutrin  on board.  Denies SI/HI.  Refills sent. Labs today to check NA+ level which chronically is low at baseline, monitor closely with Effexor  use.      Relevant Medications   buPROPion  (WELLBUTRIN  XL) 300 MG 24 hr tablet   venlafaxine  (EFFEXOR ) 50 MG tablet   Chronic headaches   Chronic, ongoing.  CT imaging reassuring.  Continue collaboration with neurology and regimen as ordered by them.  Recent notes reviewed.      Relevant Medications   buPROPion  (WELLBUTRIN  XL) 300 MG 24 hr tablet   venlafaxine  (EFFEXOR ) 50 MG tablet   Chronic fatigue   Chronic, ongoing. Wellbutrin  offers some benefit. Labs today.      Relevant Orders   Ferritin   Iron   CBC with Differential/Platelet   ANA positive   Follows with rheumatology, recent notes reviewed. Continue this collaboration.      Alcohol abuse, in remission   Chronic, stable.  Has been in remission for >8 years.  Continue to monitor closely for relapse and  encourage continued cessation.  H/O heavy alcohol use with suicidal ideation.      Other Visit Diagnoses       Postmenopausal estrogen deficiency       DEXA ordered and instructed ot schedule with mammogram.   Relevant Orders   DG Bone Density     Left foot pain       Pre op papers completed. She is aware to hold ASA 7 days prior to surgery and may restart 48 hurs after if no bleeding present.     Encounter for annual physical exam            Follow up plan: Return in about 1 year (around 11/01/2025) for Annual Physical.   LABORATORY TESTING:  - Pap smear: not applicable  IMMUNIZATIONS:   - Tdap: Tetanus vaccination status reviewed: last tetanus booster within 10 years. - Influenza: Up to date - Pneumovax: Up to date - Prevnar: Up to date - COVID: has had a couple - HPV: Not  applicable - Shingrix vaccine: Up to date  SCREENING: -Mammogram: Order in place  - Colonoscopy: Up to date  - Bone Density: Ordered today  -Hearing Test: Not applicable  -Spirometry: Not applicable   PATIENT COUNSELING:   Advised to take 1 mg of folate supplement per day if capable of pregnancy.   Sexuality: Discussed sexually transmitted diseases, partner selection, use of condoms, avoidance of unintended pregnancy  and contraceptive alternatives.   Advised to avoid cigarette smoking.  I discussed with the patient that most people either abstain from alcohol or drink within safe limits (<=14/week and <=4 drinks/occasion for males, <=7/weeks and <= 3 drinks/occasion for females) and that the risk for alcohol disorders and other health effects rises proportionally with the number of drinks per week and how often a drinker exceeds daily limits.  Discussed cessation/primary prevention of drug use and availability of treatment for abuse.   Diet: Encouraged to adjust caloric intake to maintain  or achieve ideal body weight, to reduce intake of dietary saturated fat and total fat, to limit sodium  intake by avoiding high sodium foods and not adding table salt, and to maintain adequate dietary potassium and calcium  preferably from fresh fruits, vegetables, and low-fat dairy products.    stressed the importance of regular exercise  Injury prevention: Discussed safety belts, safety helmets, smoke detector, smoking near bedding or upholstery.   Dental health: Discussed importance of regular tooth brushing, flossing, and dental visits.    NEXT PREVENTATIVE PHYSICAL DUE IN 1 YEAR. Return in about 1 year (around 11/01/2025) for Annual Physical.               [1]  Allergies Allergen Reactions   Gabapentin  Other (See Comments)    Fatigue   Trazodone  And Nefazodone Nausea And Vomiting and Nausea Only   Tramadol Nausea Only  [2]  Social History Tobacco Use  Smoking Status Never  Smokeless Tobacco Never   "

## 2024-11-01 NOTE — Assessment & Plan Note (Signed)
 Chronic, ongoing.  CT imaging reassuring.  Continue collaboration with neurology and regimen as ordered by them.  Recent notes reviewed.

## 2024-11-01 NOTE — Assessment & Plan Note (Signed)
 Follows with rheumatology, recent notes reviewed. Continue this collaboration.

## 2024-11-01 NOTE — Assessment & Plan Note (Signed)
 Chronic, stable with current Effexor  dose and Wellbutrin  on board.  Denies SI/HI.  Refills sent. Labs today to check NA+ level which chronically is low at baseline, monitor closely with Effexor  use.

## 2024-11-01 NOTE — Assessment & Plan Note (Signed)
 Chronic, ongoing. Wellbutrin  offers some benefit. Labs today.

## 2024-11-02 ENCOUNTER — Ambulatory Visit: Payer: Self-pay | Admitting: Nurse Practitioner

## 2024-11-02 LAB — CBC WITH DIFFERENTIAL/PLATELET
Basophils Absolute: 0.1 x10E3/uL (ref 0.0–0.2)
Basos: 1 %
EOS (ABSOLUTE): 0.1 x10E3/uL (ref 0.0–0.4)
Eos: 2 %
Hematocrit: 38.8 % (ref 34.0–46.6)
Hemoglobin: 13 g/dL (ref 11.1–15.9)
Immature Grans (Abs): 0 x10E3/uL (ref 0.0–0.1)
Immature Granulocytes: 0 %
Lymphocytes Absolute: 1 x10E3/uL (ref 0.7–3.1)
Lymphs: 20 %
MCH: 32.7 pg (ref 26.6–33.0)
MCHC: 33.5 g/dL (ref 31.5–35.7)
MCV: 98 fL — ABNORMAL HIGH (ref 79–97)
Monocytes Absolute: 0.5 x10E3/uL (ref 0.1–0.9)
Monocytes: 10 %
Neutrophils Absolute: 3.6 x10E3/uL (ref 1.4–7.0)
Neutrophils: 67 %
Platelets: 360 x10E3/uL (ref 150–450)
RBC: 3.98 x10E6/uL (ref 3.77–5.28)
RDW: 11.3 % — ABNORMAL LOW (ref 11.7–15.4)
WBC: 5.3 x10E3/uL (ref 3.4–10.8)

## 2024-11-02 LAB — LIPID PANEL W/O CHOL/HDL RATIO
Cholesterol, Total: 225 mg/dL — ABNORMAL HIGH (ref 100–199)
HDL: 92 mg/dL
LDL Chol Calc (NIH): 111 mg/dL — ABNORMAL HIGH (ref 0–99)
Triglycerides: 128 mg/dL (ref 0–149)
VLDL Cholesterol Cal: 22 mg/dL (ref 5–40)

## 2024-11-02 LAB — COMPREHENSIVE METABOLIC PANEL WITH GFR
ALT: 22 IU/L (ref 0–32)
AST: 28 IU/L (ref 0–40)
Albumin: 4.8 g/dL (ref 3.9–4.9)
Alkaline Phosphatase: 130 IU/L (ref 49–135)
BUN/Creatinine Ratio: 19 (ref 12–28)
BUN: 10 mg/dL (ref 8–27)
Bilirubin Total: <0.2 mg/dL (ref 0.0–1.2)
CO2: 22 mmol/L (ref 20–29)
Calcium: 9.3 mg/dL (ref 8.7–10.3)
Chloride: 97 mmol/L (ref 96–106)
Creatinine, Ser: 0.54 mg/dL — ABNORMAL LOW (ref 0.57–1.00)
Globulin, Total: 2.5 g/dL (ref 1.5–4.5)
Glucose: 93 mg/dL (ref 70–99)
Potassium: 4.5 mmol/L (ref 3.5–5.2)
Sodium: 131 mmol/L — ABNORMAL LOW (ref 134–144)
Total Protein: 7.3 g/dL (ref 6.0–8.5)
eGFR: 100 mL/min/1.73

## 2024-11-02 LAB — T4, FREE: Free T4: 0.97 ng/dL (ref 0.82–1.77)

## 2024-11-02 LAB — IRON: Iron: 64 ug/dL (ref 27–139)

## 2024-11-02 LAB — VITAMIN B12: Vitamin B-12: 1074 pg/mL (ref 232–1245)

## 2024-11-02 LAB — TSH: TSH: 2.69 u[IU]/mL (ref 0.450–4.500)

## 2024-11-02 LAB — FERRITIN: Ferritin: 168 ng/mL — ABNORMAL HIGH (ref 15–150)

## 2024-11-02 LAB — VITAMIN D 25 HYDROXY (VIT D DEFICIENCY, FRACTURES): Vit D, 25-Hydroxy: 52.2 ng/mL (ref 30.0–100.0)

## 2024-11-02 NOTE — Progress Notes (Signed)
 Contacted via MyChart The 10-year ASCVD risk score (Arnett DK, et al., 2019) is: 8.7%   Values used to calculate the score:     Age: 68 years     Clinically relevant sex: Female     Is Non-Hispanic African American: No     Diabetic: No     Tobacco smoker: No     Systolic Blood Pressure: 135 mmHg     Is BP treated: No     HDL Cholesterol: 92 mg/dL     Total Cholesterol: 225 mg/dL  Good afternoon Patty Cruz, your labs have returned and overall are reassuring with exception of a few things: - Sodium remains mildly low at 131. Continue to take salt tablets to help levels remain stable and try to to drink too much water daily. - Ferritin mildly elevated, but iron level and CBC overall stable.  - Kidney function, creatinine and eGFR, remains normal, as is liver function, AST and ALT.  - Thyroid  labs normal. - Your cholesterol is still high. Your LDL is above normal.  The LDL is the bad cholesterol.  Over time and in combination with inflammation and other factors, this contributes to plaque which in turn may lead to stroke and/or heart attack down the road.  Sometimes high LDL is primarily genetic, and people might be eating all the right foods but still have high numbers.  Other times, there is room for improvement in one's diet and eating healthier can bring this number down and potentially reduce one's risk of heart attack and/or stroke. To reduce your LDL, Remember - more fruits and vegetables, more fish, and limit red meat and dairy products.  More soy, nuts, beans, barley, lentils, oats and plant sterol ester enriched margarine instead of butter.  I also encourage eliminating sugar and processed food.  Remember, shop on the outside of the grocery store and visit your [employer]. Anyone at or over 7.5% on ASCVD risk score, I offer a statin. Your level is 8.7%.   The task force found that in adults at increased risk but with no history of CVD events, statin therapy significantly reduced the risk of  clinical outcomes such as stroke or heart attack by 22% to 33%, while also significantly reducing the risks of all-cause mortality and composite cardiovascular outcomes. Statins both lower cholesterol, which is helpful in reducing the risk of stroke and heart attack. Even outside of lowering cholesterol, statins seem to protect heart health. Other important parts of heart health are healthy diet and regular exercise. Would you like to trial a low dose of Rosuvastatin? Let me know. Any question? Keep being amazing!!  Thank you for allowing me to participate in your care.  I appreciate you. Kindest regards, Patty Cruz

## 2024-11-08 ENCOUNTER — Encounter: Payer: Self-pay | Admitting: Podiatry

## 2024-11-10 ENCOUNTER — Ambulatory Visit: Payer: Self-pay

## 2024-11-10 ENCOUNTER — Encounter
Admission: RE | Disposition: A | Discharge status: Home / Self Care | Payer: Self-pay | Source: Home / Self Care | Attending: Podiatry

## 2024-11-10 ENCOUNTER — Ambulatory Visit: Payer: Self-pay | Admitting: Anesthesiology

## 2024-11-10 ENCOUNTER — Other Ambulatory Visit: Payer: Self-pay

## 2024-11-10 ENCOUNTER — Encounter: Payer: Self-pay | Admitting: Podiatry

## 2024-11-10 ENCOUNTER — Ambulatory Visit
Admission: RE | Admit: 2024-11-10 | Discharge: 2024-11-10 | Disposition: A | Discharge status: Home / Self Care | Source: Home / Self Care | Attending: Podiatry | Admitting: Podiatry

## 2024-11-10 DIAGNOSIS — M21622 Bunionette of left foot: Secondary | ICD-10-CM | POA: Diagnosis not present

## 2024-11-10 DIAGNOSIS — M2042 Other hammer toe(s) (acquired), left foot: Secondary | ICD-10-CM | POA: Insufficient documentation

## 2024-11-10 DIAGNOSIS — F32A Depression, unspecified: Secondary | ICD-10-CM | POA: Insufficient documentation

## 2024-11-10 DIAGNOSIS — M2012 Hallux valgus (acquired), left foot: Secondary | ICD-10-CM | POA: Insufficient documentation

## 2024-11-10 DIAGNOSIS — F419 Anxiety disorder, unspecified: Secondary | ICD-10-CM | POA: Diagnosis not present

## 2024-11-10 HISTORY — PX: HAMMER TOE SURGERY: SHX385

## 2024-11-10 HISTORY — DX: Other chondrocalcinosis, unspecified site: M11.20

## 2024-11-10 HISTORY — DX: Chronic migraine with aura, not intractable, without status migrainosus: G43.E09

## 2024-11-10 HISTORY — DX: Pain in unspecified joint: M25.50

## 2024-11-10 HISTORY — PX: WEIL OSTEOTOMY: SHX5044

## 2024-11-10 HISTORY — PX: ARTHRODESIS METATARSALPHALANGEAL JOINT (MTPJ): SHX6566

## 2024-11-10 HISTORY — DX: Radiculopathy, cervical region: M54.12

## 2024-11-10 SURGERY — FUSION, JOINT, GREAT TOE
Anesthesia: General | Site: Toe | Laterality: Left

## 2024-11-10 MED ORDER — MIDAZOLAM HCL 2 MG/2ML IJ SOLN
INTRAMUSCULAR | Status: AC
  Filled 2024-11-10: qty 2

## 2024-11-10 MED ORDER — ONDANSETRON HCL 4 MG/2ML IJ SOLN
INTRAMUSCULAR | Status: DC | PRN
  Administered 2024-11-10: 4 mg via INTRAVENOUS

## 2024-11-10 MED ORDER — DEXAMETHASONE SODIUM PHOSPHATE 4 MG/ML IJ SOLN
INTRAMUSCULAR | Status: AC
  Filled 2024-11-10: qty 1

## 2024-11-10 MED ORDER — FENTANYL CITRATE (PF) 100 MCG/2ML IJ SOLN
INTRAMUSCULAR | Status: AC
  Filled 2024-11-10: qty 2

## 2024-11-10 MED ORDER — CEFAZOLIN SODIUM-DEXTROSE 2-4 GM/100ML-% IV SOLN
2.0000 g | INTRAVENOUS | Status: AC
  Administered 2024-11-10: 2 g via INTRAVENOUS

## 2024-11-10 MED ORDER — EPHEDRINE 5 MG/ML INJ
INTRAVENOUS | Status: AC
  Filled 2024-11-10: qty 5

## 2024-11-10 MED ORDER — ONDANSETRON HCL 4 MG/2ML IJ SOLN
INTRAMUSCULAR | Status: AC
  Filled 2024-11-10: qty 2

## 2024-11-10 MED ORDER — EPHEDRINE SULFATE (PRESSORS) 25 MG/5ML IV SOSY
PREFILLED_SYRINGE | INTRAVENOUS | Status: DC | PRN
  Administered 2024-11-10 (×4): 5 mg via INTRAVENOUS
  Administered 2024-11-10: 10 mg via INTRAVENOUS
  Administered 2024-11-10: 5 mg via INTRAVENOUS
  Administered 2024-11-10: 10 mg via INTRAVENOUS
  Administered 2024-11-10: 5 mg via INTRAVENOUS
  Administered 2024-11-10: 10 mg via INTRAVENOUS
  Administered 2024-11-10: 5 mg via INTRAVENOUS

## 2024-11-10 MED ORDER — LIDOCAINE HCL (PF) 1 % IJ SOLN
INTRAMUSCULAR | Status: DC | PRN
  Administered 2024-11-10: .5 mL via SUBCUTANEOUS

## 2024-11-10 MED ORDER — LACTATED RINGERS IV SOLN: INTRAVENOUS | Status: DC

## 2024-11-10 MED ORDER — FENTANYL CITRATE (PF) 100 MCG/2ML IJ SOLN
100.0000 ug | Freq: Once | INTRAMUSCULAR | Status: AC
  Administered 2024-11-10: 100 ug via INTRAVENOUS

## 2024-11-10 MED ORDER — FENTANYL CITRATE (PF) 50 MCG/ML IJ SOSY: 50.0000 ug | PREFILLED_SYRINGE | INTRAMUSCULAR | Status: DC | PRN

## 2024-11-10 MED ORDER — BUPIVACAINE LIPOSOME 1.3 % IJ SUSP
INTRAMUSCULAR | Status: AC
  Filled 2024-11-10: qty 10

## 2024-11-10 MED ORDER — PHENYLEPHRINE 80 MCG/ML (10ML) SYRINGE FOR IV PUSH (FOR BLOOD PRESSURE SUPPORT)
PREFILLED_SYRINGE | INTRAVENOUS | Status: AC
  Filled 2024-11-10: qty 10

## 2024-11-10 MED ORDER — PROPOFOL 10 MG/ML IV BOLUS
INTRAVENOUS | Status: DC | PRN
  Administered 2024-11-10: 200 mg via INTRAVENOUS

## 2024-11-10 MED ORDER — DEXAMETHASONE SODIUM PHOSPHATE 4 MG/ML IJ SOLN
INTRAMUSCULAR | Status: DC | PRN
  Administered 2024-11-10: 4 mg via INTRAVENOUS

## 2024-11-10 MED ORDER — SODIUM CHLORIDE 0.9 % IR SOLN
Status: DC | PRN
  Administered 2024-11-10 (×2): 1

## 2024-11-10 MED ORDER — OXYCODONE-ACETAMINOPHEN 5-325 MG PO TABS: 1.0000 | ORAL_TABLET | Freq: Four times a day (QID) | ORAL | 0 refills | Status: AC | PRN

## 2024-11-10 MED ORDER — CEFAZOLIN SODIUM-DEXTROSE 2-3 GM-%(50ML) IV SOLR
INTRAVENOUS | Status: AC
  Filled 2024-11-10: qty 50

## 2024-11-10 MED ORDER — PROPOFOL 10 MG/ML IV BOLUS
INTRAVENOUS | Status: AC
  Filled 2024-11-10: qty 20

## 2024-11-10 MED ORDER — BUPIVACAINE HCL (PF) 0.5 % IJ SOLN
INTRAMUSCULAR | Status: DC | PRN
  Administered 2024-11-10: 30 mL via PERINEURAL

## 2024-11-10 MED ORDER — BUPIVACAINE LIPOSOME 1.3 % IJ SUSP
INTRAMUSCULAR | Status: DC | PRN
  Administered 2024-11-10: 10 mL via PERINEURAL

## 2024-11-10 MED ORDER — DEXAMETHASONE SOD PHOSPHATE PF 10 MG/ML IJ SOLN
INTRAMUSCULAR | Status: DC | PRN
  Administered 2024-11-10: 10 mg via PERINEURAL

## 2024-11-10 MED ORDER — BUPIVACAINE HCL (PF) 0.5 % IJ SOLN
INTRAMUSCULAR | Status: AC
  Filled 2024-11-10: qty 30

## 2024-11-10 MED ORDER — LIDOCAINE HCL (CARDIAC) PF 100 MG/5ML IV SOSY
PREFILLED_SYRINGE | INTRAVENOUS | Status: DC | PRN
  Administered 2024-11-10: 100 mg via INTRATRACHEAL

## 2024-11-10 MED ORDER — PHENYLEPHRINE HCL (PRESSORS) 10 MG/ML IV SOLN
INTRAVENOUS | Status: DC | PRN
  Administered 2024-11-10 (×2): 80 ug via INTRAVENOUS
  Administered 2024-11-10: 160 ug via INTRAVENOUS
  Administered 2024-11-10 (×2): 80 ug via INTRAVENOUS
  Administered 2024-11-10 (×2): 160 ug via INTRAVENOUS
  Administered 2024-11-10 (×2): 80 ug via INTRAVENOUS
  Administered 2024-11-10: 160 ug via INTRAVENOUS
  Administered 2024-11-10 (×3): 80 ug via INTRAVENOUS

## 2024-11-10 MED ORDER — MIDAZOLAM HCL (PF) 2 MG/2ML IJ SOLN
2.0000 mg | INTRAMUSCULAR | Status: DC | PRN
  Administered 2024-11-10: 2 mg via INTRAVENOUS

## 2024-11-10 MED ORDER — LIDOCAINE HCL (PF) 2 % IJ SOLN
INTRAMUSCULAR | Status: AC
  Filled 2024-11-10: qty 5

## 2024-11-10 MED ORDER — LIDOCAINE HCL 2 % IJ SOLN
INTRAMUSCULAR | Status: AC
  Filled 2024-11-10: qty 2

## 2024-11-10 SURGICAL SUPPLY — 51 items
BENZOIN TINCTURE PRP APPL 2/3 (GAUZE/BANDAGES/DRESSINGS) ×2 IMPLANT
BIT DRILL 1.7 LNG CANN (DRILL) IMPLANT
BIT DRILL 2 FENESTRATED (MISCELLANEOUS) IMPLANT
BIT DRILL SOLID 2.0 X 110MM (DRILL) IMPLANT
BLADE MINI RND TIP GREEN BEAV (BLADE) IMPLANT
BLADE OSC/SAGITTAL 5.5X25 (BLADE) IMPLANT
BLADE OSC/SAGITTAL MD 5.5X18 (BLADE) IMPLANT
BLADE SURG 15 STRL LF DISP TIS (BLADE) IMPLANT
BNDG COHESIVE 4X5 TAN STRL LF (GAUZE/BANDAGES/DRESSINGS) ×2 IMPLANT
BNDG ESMARCH 4X12 STRL LF (GAUZE/BANDAGES/DRESSINGS) ×2 IMPLANT
BNDG GAUZE DERMACEA FLUFF 4 (GAUZE/BANDAGES/DRESSINGS) IMPLANT
BNDG STRETCH 4X75 STRL LF (GAUZE/BANDAGES/DRESSINGS) ×2 IMPLANT
BOOT STEPPER DURA SM (SOFTGOODS) IMPLANT
CANISTER SUCT 1200ML W/VALVE (MISCELLANEOUS) ×2 IMPLANT
CLEANER CAUTERY TIP PAD (MISCELLANEOUS) IMPLANT
CNTRSNK DRL 2 SCR (MISCELLANEOUS) IMPLANT
COVER LIGHT HANDLE UNIVERSAL (MISCELLANEOUS) ×4 IMPLANT
DRAPE FLUOR MINI C-ARM 54X84 (DRAPES) ×2 IMPLANT
DURAPREP 26ML APPLICATOR (WOUND CARE) ×2 IMPLANT
ELECTRODE REM PT RTRN 9FT ADLT (ELECTROSURGICAL) ×2 IMPLANT
GAUZE SPONGE 4X4 12PLY STRL (GAUZE/BANDAGES/DRESSINGS) ×2 IMPLANT
GAUZE XEROFORM 1X8 LF (GAUZE/BANDAGES/DRESSINGS) ×2 IMPLANT
GLOVE BIOGEL PI IND STRL 8 (GLOVE) ×2 IMPLANT
GLOVE SURG SS PI 7.5 STRL IVOR (GLOVE) ×2 IMPLANT
GOWN SPEC L4 XLG W/TWL (GOWN DISPOSABLE) ×2 IMPLANT
GOWN STRL REUS W/ TWL LRG LVL3 (GOWN DISPOSABLE) ×2 IMPLANT
GUIDEWIRE 1.1 DT 2-ZONE (Wire) IMPLANT
KIT TURNOVER KIT A (KITS) ×2 IMPLANT
KWIRE DBL END TROCAR 6X.045 (WIRE) IMPLANT
KWIRE SMOOTH 1.6X150MM (WIRE) IMPLANT
NS IRRIG 500ML POUR BTL (IV SOLUTION) ×2 IMPLANT
PACK EXTREMITY ARMC (MISCELLANEOUS) ×2 IMPLANT
PENCIL SMOKE EVACUATOR (MISCELLANEOUS) ×2 IMPLANT
PIN BALLS 3/8 F/.045 WIRE (MISCELLANEOUS) ×2 IMPLANT
PLATE ZERO DEGREE SHORT LT 2H (Plate) IMPLANT
RASP SM TEAR CROSS CUT (RASP) IMPLANT
SCREW 2.7 X 15 NON LOCK (Screw) IMPLANT
SCREW CANN HEAD ST 2.0X10 (Screw) IMPLANT
SCREW HEAD ST 12X2XHD NS (Screw) IMPLANT
SCREW LOCK PLATE R3 2.7X11 (Screw) IMPLANT
SCREW LOCK PLATE R3 2.7X13 (Screw) IMPLANT
SCREW LOCK PLATE R3 2.7X14 (Screw) IMPLANT
SCREW LOCK PLATE R3 2.7X16 (Screw) IMPLANT
STOCKINETTE IMPERVIOUS LG (DRAPES) ×2 IMPLANT
STRIP CLOSURE SKIN 1/4X4 (GAUZE/BANDAGES/DRESSINGS) ×2 IMPLANT
SUT VIC AB 3-0 SH 27X BRD (SUTURE) IMPLANT
SUT VIC AB 4-0 SH 27XANBCTRL (SUTURE) IMPLANT
SUTURE ETHLN 4-0 FS2 18XMF BLK (SUTURE) IMPLANT
SUTURE MNCRL 4-0 27XMF (SUTURE) IMPLANT
WIRE OLIVE SMOOTH 1.4MMX60MM (WIRE) IMPLANT
WIRE SMOOTH TROCAR .9MMX150MML (WIRE) IMPLANT

## 2024-11-10 NOTE — Progress Notes (Signed)
 Assisted [facility] ANMD with popliteal/saphenous block. Side rails up, monitors on throughout procedure. See vital signs in flow sheet. Tolerated Procedure well.

## 2024-11-10 NOTE — Anesthesia Procedure Notes (Signed)
 Procedure Name: LMA Insertion Date/Time: 11/10/2024 11:16 AM  Performed by: Menachem Urbanek, CRNAPre-anesthesia Checklist: Patient identified, Emergency Drugs available, Suction available, Patient being monitored and Timeout performed Patient Re-evaluated:Patient Re-evaluated prior to induction Oxygen Delivery Method: Circle system utilized Preoxygenation: Pre-oxygenation with 100% oxygen Induction Type: IV induction LMA: LMA inserted LMA Size: 4.0 Tube type: Oral Number of attempts: 1 Placement Confirmation: positive ETCO2, CO2 detector and breath sounds checked- equal and bilateral Tube secured with: Tape Dental Injury: Teeth and Oropharynx as per pre-operative assessment

## 2024-11-10 NOTE — Op Note (Signed)
 Operative note   Surgeon:Berel Najjar [employer]support[employer]administrative Officer: None    Preop diagnosis: 1.  Hallux valgus forming left foot 2.  Hammertoe contracture left second toe 3.  Hammertoe contracture left third toe 4.  Hammertoe contracture left fourth toe 5.  Hammertoe contracture left fifth toe 6.  Tailor's bunion deformity left foot    Postop diagnosis: Same    Procedure: 1.  Arthrodesis left first MTPJ 2.  PIPJ arthrodesis left second toe 3.  PIPJ arthrodesis left third toe 4.  Weil osteotomy left third metatarsal 5.  Weil osteotomy left fourth metatarsal 6.  Reverse Austin left fifth metatarsal 7.  Metatarsophalangeal joint release left second metatarsal 8.  Open flexor tenotomy left fourth toe 9.  Open flexor tenotomy left fifth toe 10.  Intraoperative fluoroscopy without assistance of radiologist    EBL: Minimal    Anesthesia: General With popliteal block.  Popliteal block placed in the preoperative holding area    Hemostasis: Mid calf tourniquet inflated to 200 mmHg for 120 minutes    Specimen: None    Complications: None    Operative indications:Patty Cruz is an 68 y.o. that presents today for surgical intervention.  The risks/benefits/alternatives/complications have been discussed and consent has been given.    Procedure:  Patient was brought into the OR and placed on the operating table in thesupine position. After anesthesia was obtained theleft lower extremity was prepped and draped in usual sterile fashion.  Attention was initially directed to the dorsomedial first MTPJ where a dorsomedial incision performed.  Sharp and blunt dissection carried down to the capsule.  A longitudinal capsulotomy was performed.  The head of the metatarsal and base of the proximal phalanx were exposed.  At this time all articular cartilage was removed with a combination of rongeur and curette.  The metatarsophalangeal joint was smoothed with a power rasp.  After flush the joint was prepared with a 2.0 mm drill  bit.  The toe was placed in a better realigned rectus position at about 5 degrees of dorsiflexion and 5 degrees of valgus.  This was then stabilized with a dorsal locking plate from the [employer] set.  2.7 millimeter screws were used both proximal and distal.  Good realignment was noted.  Attention was then directed to the second toe where a dorsal incision performed at the PIPJ coursing to the MTPJ in the intermetatarsal space.  Sharp and blunt dissection carried down to the extensor tendon.  A extensor tenotomy lengthening was performed exposing the PIPJ region.  This was taken back proximal to the metatarsophalangeal joint.  The dorsal medial and lateral capsule of the second toe joint was then released and the plantar plate was released with the McGlamery elevator.  Good realignment of the second toe was noted after release of the joint.  This does still had contracture at the previous PIPJ.  An osteotomy was created and a wedge was removed at the PIPJ to realign the second toe.  An incision was made over the PIPJ of the third toe.  The head of the proximal phalanx and base of the middle phalanx were exposed and the extensor tendon was reflected proximally.  The head of the proximal phalanx was removed with a power saw and as well as the base of the middle phalanx.  Attention was directed to the third MTPJ through the previous incision and the dorsal medial lateral capsule was released.  An extensor lengthening procedure was performed.  The third metatarsal was noted  to be elongated.  A power saw was then used to create a Weil osteotomy shortening.  This was then stabilized with a 2 oh millimeter partially-threaded cannulated screw from the [employer] set with good alignment noted.  Attention was then directed to the fourth toe where an incision was made at the level of the metatarsophalangeal joint.  Sharp and blunt dissection carried down to the extensor tendon.  Extensor tenotomy and lengthening was  performed.  This time a Weil shortening osteotomy was performed to the fourth metatarsal.  This was then stabilized with a 2 oh partially-threaded cannulated screw from the [employer] set.  Good alignment was noted on fluoroscopy.  Residual contracture of the fourth toe at the PIPJ was noted.  A open flexor tenotomy was performed at the plantar aspect.  Attention was directed to the fifth MTPJ where noted tailor's bunion deformity was found.  Incision was made and sharp and blunt dissection carried down to the capsule.  A longitudinal capsulotomy was performed.  Next a V osteotomy was created to the distal metatarsal and translocated medially.  This was stabilized with a 2.0 mm cannulated screw.  The overhanging eminence was transected and smoothed with a power rasp.  A small capsulorrhaphy was performed and the fifth toe was in a much better realign position.  Mild adductovarus contracture of the toe was noted and an open flexor tenotomy was performed to the lateral aspect at the PIPJ region with good realignment noted.  At this time all areas were noted to be much better realign position.  The PIPJ of the 2nd and 3rd toes were stabilized with 1.1 mm Arthrex flexible wires crossing the MTPJ and PIPJ.  All incisions were reapproximated with the extensor tendons all reapproximated with a 4-0 Vicryl.  The subcutaneous tissue reapproximated with 4-0 and 3-0 Vicryl and the skin reapproximated with a 3-0 nylon.  The first MTPJ was reapproximated with 3-0 Vicryl the deeper tissue 4-0 Vicryl subcutaneous tissue and a 4-0 Monocryl for the skin.  Intraoperative fluoroscopy was used throughout the entire procedure to assess realignment and reduction as well as removal of bone.    Patient tolerated the procedure and anesthesia well.  Was transported from the OR to the PACU with all vital signs stable and vascular status intact. To be discharged per routine protocol.  Will follow up in approximately 1 week in the outpatient  clinic.

## 2024-11-10 NOTE — H&P (Signed)
 HISTORY AND PHYSICAL INTERVAL NOTE:  11/10/2024  10:44 AM  Patty Cruz  has presented today for surgery, with the diagnosis of Hallux valgus of left foot M20.12 Hammer toe of left foot M20.42.  The various methods of treatment have been discussed with the patient.  No guarantees were given.  After consideration of risks, benefits and other options for treatment, the patient has consented to surgery.  I have reviewed the patients chart and labs.     A history and physical examination was performed in my office.  The patient was reexamined.  There have been no changes to this history and physical examination.  Ashley Soulier A

## 2024-11-10 NOTE — Transfer of Care (Signed)
 Immediate Anesthesia Transfer of Care Note  Patient: Patty Cruz  Procedure(s) Performed: FUSION, JOINT, GREAT TOE (Left: Toe) OSTEOTOMY, WEIL (Left) CORRECTION, HAMMER TOE (Left: Toe)  Patient Location: PACU  Anesthesia Type: General  Level of Consciousness: awake, alert  and patient cooperative  Airway and Oxygen Therapy: Patient Spontanous Breathing and Patient connected to supplemental oxygen  Post-op Assessment: Post-op Vital signs reviewed, Patient's Cardiovascular Status Stable, Respiratory Function Stable, Patent Airway and No signs of Nausea or vomiting  Post-op Vital Signs: Reviewed and stable  Complications: No notable events documented.

## 2024-11-10 NOTE — Anesthesia Postprocedure Evaluation (Signed)
"   Anesthesia Post Note  Patient: Patty Cruz  Procedure(s) Performed: FUSION, JOINT, GREAT TOE (Left: Toe) OSTEOTOMY, WEIL (Left) CORRECTION, HAMMER TOE (Left: Toe)  Patient location during evaluation: PACU Anesthesia Type: General Level of consciousness: awake and alert Pain management: pain level controlled Vital Signs Assessment: post-procedure vital signs reviewed and stable Respiratory status: spontaneous breathing, nonlabored ventilation, respiratory function stable and patient connected to nasal cannula oxygen Cardiovascular status: blood pressure returned to baseline and stable Postop Assessment: no apparent nausea or vomiting Anesthetic complications: no   No notable events documented.   Last Vitals:  Vitals:   11/10/24 1420 11/10/24 1424  BP:  (!) 142/80  Pulse:  (!) 117  Resp:  18  Temp: (P) 36.8 C   SpO2:  94%    Last Pain:  Vitals:   11/10/24 1424  PainSc: 0-No pain                 Lorraine Cimmino C Rodriques Badie      "

## 2024-11-12 ENCOUNTER — Encounter: Payer: Self-pay | Admitting: Podiatry

## 2025-10-13 ENCOUNTER — Ambulatory Visit
